# Patient Record
Sex: Female | Born: 1965 | Race: Black or African American | Hispanic: No | Marital: Married | Smoking: Never smoker
Health system: Southern US, Community
[De-identification: ages and names within clinical notes are randomized; demographics above are authoritative.]

## PROBLEM LIST (undated history)

## (undated) DIAGNOSIS — E119 Type 2 diabetes mellitus without complications: Secondary | ICD-10-CM

## (undated) DIAGNOSIS — I10 Essential (primary) hypertension: Secondary | ICD-10-CM

## (undated) DIAGNOSIS — E785 Hyperlipidemia, unspecified: Secondary | ICD-10-CM

## (undated) DIAGNOSIS — O88219 Thromboembolism in pregnancy, unspecified trimester: Secondary | ICD-10-CM

## (undated) HISTORY — PX: OTHER SURGICAL HISTORY: SHX169

## (undated) HISTORY — DX: Type 2 diabetes mellitus without complications: E11.9

## (undated) HISTORY — DX: Hyperlipidemia, unspecified: E78.5

## (undated) HISTORY — PX: NO PAST SURGERIES: SHX2092

---

## 2000-07-31 DIAGNOSIS — O88219 Thromboembolism in pregnancy, unspecified trimester: Secondary | ICD-10-CM

## 2000-07-31 HISTORY — DX: Thromboembolism in pregnancy, unspecified trimester: O88.219

## 2004-03-10 ENCOUNTER — Emergency Department (HOSPITAL_COMMUNITY): Admission: EM | Admit: 2004-03-10 | Discharge: 2004-03-10 | Payer: Self-pay | Admitting: Emergency Medicine

## 2004-03-26 ENCOUNTER — Emergency Department (HOSPITAL_COMMUNITY): Admission: EM | Admit: 2004-03-26 | Discharge: 2004-03-26 | Payer: Self-pay | Admitting: Emergency Medicine

## 2007-11-12 ENCOUNTER — Encounter: Admission: RE | Admit: 2007-11-12 | Discharge: 2007-11-12 | Payer: Self-pay | Admitting: Family Medicine

## 2007-11-22 ENCOUNTER — Encounter: Admission: RE | Admit: 2007-11-22 | Discharge: 2007-11-22 | Payer: Self-pay | Admitting: Family Medicine

## 2010-08-21 ENCOUNTER — Encounter: Payer: Self-pay | Admitting: Family Medicine

## 2010-11-25 ENCOUNTER — Other Ambulatory Visit: Payer: Self-pay | Admitting: Obstetrics

## 2010-11-25 DIAGNOSIS — Z1231 Encounter for screening mammogram for malignant neoplasm of breast: Secondary | ICD-10-CM

## 2010-12-02 ENCOUNTER — Ambulatory Visit (HOSPITAL_COMMUNITY): Admission: RE | Admit: 2010-12-02 | Payer: BC Managed Care – PPO | Source: Ambulatory Visit

## 2011-01-31 ENCOUNTER — Ambulatory Visit (HOSPITAL_COMMUNITY)
Admission: RE | Admit: 2011-01-31 | Discharge: 2011-01-31 | Disposition: A | Discharge status: Home / Self Care | Payer: BC Managed Care – PPO | Source: Ambulatory Visit | Attending: Obstetrics | Admitting: Obstetrics

## 2011-01-31 DIAGNOSIS — Z1231 Encounter for screening mammogram for malignant neoplasm of breast: Secondary | ICD-10-CM | POA: Insufficient documentation

## 2011-02-03 ENCOUNTER — Other Ambulatory Visit: Payer: Self-pay | Admitting: Obstetrics

## 2011-02-03 DIAGNOSIS — R928 Other abnormal and inconclusive findings on diagnostic imaging of breast: Secondary | ICD-10-CM

## 2011-02-08 ENCOUNTER — Ambulatory Visit
Admission: RE | Admit: 2011-02-08 | Discharge: 2011-02-08 | Disposition: A | Discharge status: Home / Self Care | Payer: BC Managed Care – PPO | Source: Ambulatory Visit | Attending: Obstetrics | Admitting: Obstetrics

## 2011-02-08 DIAGNOSIS — R928 Other abnormal and inconclusive findings on diagnostic imaging of breast: Secondary | ICD-10-CM

## 2011-03-03 ENCOUNTER — Ambulatory Visit: Admit: 2011-03-03 | Payer: Self-pay | Admitting: Obstetrics & Gynecology

## 2011-03-03 SURGERY — DILATATION & CURETTAGE/HYSTEROSCOPY WITH HYDROTHERMAL ABLATION
Anesthesia: Choice

## 2011-03-16 ENCOUNTER — Other Ambulatory Visit: Payer: Self-pay | Admitting: Obstetrics & Gynecology

## 2011-03-22 ENCOUNTER — Ambulatory Visit: Payer: BC Managed Care – PPO | Admitting: Physical Therapy

## 2011-03-28 ENCOUNTER — Ambulatory Visit: Payer: BC Managed Care – PPO | Attending: Orthopedic Surgery | Admitting: Physical Therapy

## 2011-03-28 DIAGNOSIS — IMO0001 Reserved for inherently not codable concepts without codable children: Secondary | ICD-10-CM | POA: Insufficient documentation

## 2011-03-28 DIAGNOSIS — R262 Difficulty in walking, not elsewhere classified: Secondary | ICD-10-CM | POA: Insufficient documentation

## 2011-03-28 DIAGNOSIS — M25676 Stiffness of unspecified foot, not elsewhere classified: Secondary | ICD-10-CM | POA: Insufficient documentation

## 2011-03-28 DIAGNOSIS — M25673 Stiffness of unspecified ankle, not elsewhere classified: Secondary | ICD-10-CM | POA: Insufficient documentation

## 2011-03-28 DIAGNOSIS — M25579 Pain in unspecified ankle and joints of unspecified foot: Secondary | ICD-10-CM | POA: Insufficient documentation

## 2011-03-30 ENCOUNTER — Ambulatory Visit: Payer: BC Managed Care – PPO | Admitting: Physical Therapy

## 2011-04-05 ENCOUNTER — Ambulatory Visit: Payer: BC Managed Care – PPO | Attending: Orthopedic Surgery | Admitting: Physical Therapy

## 2011-04-05 DIAGNOSIS — R262 Difficulty in walking, not elsewhere classified: Secondary | ICD-10-CM | POA: Insufficient documentation

## 2011-04-05 DIAGNOSIS — M25676 Stiffness of unspecified foot, not elsewhere classified: Secondary | ICD-10-CM | POA: Insufficient documentation

## 2011-04-05 DIAGNOSIS — IMO0001 Reserved for inherently not codable concepts without codable children: Secondary | ICD-10-CM | POA: Insufficient documentation

## 2011-04-05 DIAGNOSIS — M25579 Pain in unspecified ankle and joints of unspecified foot: Secondary | ICD-10-CM | POA: Insufficient documentation

## 2011-04-05 DIAGNOSIS — M25673 Stiffness of unspecified ankle, not elsewhere classified: Secondary | ICD-10-CM | POA: Insufficient documentation

## 2011-04-10 ENCOUNTER — Ambulatory Visit: Payer: BC Managed Care – PPO | Admitting: Physical Therapy

## 2011-04-11 ENCOUNTER — Encounter (HOSPITAL_COMMUNITY): Payer: Self-pay | Admitting: *Deleted

## 2011-04-12 ENCOUNTER — Ambulatory Visit: Payer: BC Managed Care – PPO | Admitting: Physical Therapy

## 2011-04-17 ENCOUNTER — Ambulatory Visit: Payer: BC Managed Care – PPO | Admitting: Physical Therapy

## 2011-04-19 ENCOUNTER — Ambulatory Visit: Payer: BC Managed Care – PPO | Admitting: Physical Therapy

## 2011-04-21 ENCOUNTER — Encounter (HOSPITAL_COMMUNITY): Payer: Self-pay | Admitting: Obstetrics & Gynecology

## 2011-04-21 ENCOUNTER — Encounter (HOSPITAL_COMMUNITY): Payer: Self-pay | Admitting: Anesthesiology

## 2011-04-21 ENCOUNTER — Ambulatory Visit (HOSPITAL_COMMUNITY): Payer: BC Managed Care – PPO | Admitting: Anesthesiology

## 2011-04-21 ENCOUNTER — Encounter (HOSPITAL_COMMUNITY): Payer: Self-pay | Admitting: *Deleted

## 2011-04-21 ENCOUNTER — Other Ambulatory Visit: Payer: Self-pay | Admitting: Obstetrics & Gynecology

## 2011-04-21 ENCOUNTER — Encounter (HOSPITAL_COMMUNITY)
Admission: RE | Disposition: A | Discharge status: Home / Self Care | Payer: Self-pay | Source: Ambulatory Visit | Attending: Obstetrics & Gynecology

## 2011-04-21 ENCOUNTER — Ambulatory Visit (HOSPITAL_COMMUNITY)
Admission: RE | Admit: 2011-04-21 | Discharge: 2011-04-21 | Disposition: A | Discharge status: Home / Self Care | Payer: BC Managed Care – PPO | Source: Ambulatory Visit | Attending: Obstetrics & Gynecology | Admitting: Obstetrics & Gynecology

## 2011-04-21 DIAGNOSIS — Z302 Encounter for sterilization: Secondary | ICD-10-CM | POA: Insufficient documentation

## 2011-04-21 DIAGNOSIS — Z309 Encounter for contraceptive management, unspecified: Secondary | ICD-10-CM

## 2011-04-21 DIAGNOSIS — Z641 Problems related to multiparity: Secondary | ICD-10-CM | POA: Insufficient documentation

## 2011-04-21 HISTORY — PX: TUBAL LIGATION: SHX77

## 2011-04-21 HISTORY — DX: Thromboembolism in pregnancy, unspecified trimester: O88.219

## 2011-04-21 LAB — CBC
HCT: 34.7 % — ABNORMAL LOW (ref 36.0–46.0)
Hemoglobin: 11.1 g/dL — ABNORMAL LOW (ref 12.0–15.0)
MCH: 28.3 pg (ref 26.0–34.0)
MCHC: 32 g/dL (ref 30.0–36.0)
MCV: 88.5 fL (ref 78.0–100.0)
Platelets: 273 10*3/uL (ref 150–400)
RBC: 3.92 MIL/uL (ref 3.87–5.11)
RDW: 14.3 % (ref 11.5–15.5)
WBC: 6.4 10*3/uL (ref 4.0–10.5)

## 2011-04-21 SURGERY — ESSURE TUBAL STERILIZATION
Anesthesia: Choice | Site: Vagina | Laterality: Bilateral | Wound class: Clean Contaminated

## 2011-04-21 MED ORDER — LIDOCAINE HCL 1 % IJ SOLN
INTRAMUSCULAR | Status: DC | PRN
  Administered 2011-04-21: 10 mL

## 2011-04-21 MED ORDER — KETOROLAC TROMETHAMINE 30 MG/ML IJ SOLN
INTRAMUSCULAR | Status: DC | PRN
  Administered 2011-04-21: 30 mg via INTRAVENOUS

## 2011-04-21 MED ORDER — SODIUM CHLORIDE 0.9 % IR SOLN
Status: DC | PRN
  Administered 2011-04-21: 3000 mL

## 2011-04-21 MED ORDER — LIDOCAINE HCL (CARDIAC) 20 MG/ML IV SOLN
INTRAVENOUS | Status: DC | PRN
  Administered 2011-04-21: 100 mg via INTRAVENOUS

## 2011-04-21 MED ORDER — PROMETHAZINE HCL 25 MG/ML IJ SOLN: 6.2500 mg | INTRAMUSCULAR | Status: DC | PRN

## 2011-04-21 MED ORDER — MIDAZOLAM HCL 5 MG/5ML IJ SOLN
INTRAMUSCULAR | Status: DC | PRN
  Administered 2011-04-21: 2 mg via INTRAVENOUS

## 2011-04-21 MED ORDER — ONDANSETRON HCL 4 MG/2ML IJ SOLN
INTRAMUSCULAR | Status: DC | PRN
  Administered 2011-04-21: 4 mg via INTRAVENOUS

## 2011-04-21 MED ORDER — KETOROLAC TROMETHAMINE 30 MG/ML IJ SOLN: 15.0000 mg | Freq: Once | INTRAMUSCULAR | Status: DC | PRN

## 2011-04-21 MED ORDER — ACETAMINOPHEN 325 MG PO TABS: 325.0000 mg | ORAL_TABLET | ORAL | Status: DC | PRN

## 2011-04-21 MED ORDER — PROPOFOL 10 MG/ML IV EMUL
INTRAVENOUS | Status: AC
  Filled 2011-04-21: qty 20

## 2011-04-21 MED ORDER — KETOROLAC TROMETHAMINE 30 MG/ML IJ SOLN
INTRAMUSCULAR | Status: AC
  Filled 2011-04-21: qty 1

## 2011-04-21 MED ORDER — DEXAMETHASONE SODIUM PHOSPHATE 4 MG/ML IJ SOLN
INTRAMUSCULAR | Status: DC | PRN
  Administered 2011-04-21: 10 mg via INTRAVENOUS

## 2011-04-21 MED ORDER — LIDOCAINE HCL (CARDIAC) 20 MG/ML IV SOLN
INTRAVENOUS | Status: AC
  Filled 2011-04-21: qty 5

## 2011-04-21 MED ORDER — OXYCODONE-ACETAMINOPHEN 5-325 MG PO TABS: 2.0000 | ORAL_TABLET | Freq: Four times a day (QID) | ORAL | Status: AC | PRN

## 2011-04-21 MED ORDER — FENTANYL CITRATE 0.05 MG/ML IJ SOLN
INTRAMUSCULAR | Status: AC
  Filled 2011-04-21: qty 2

## 2011-04-21 MED ORDER — FENTANYL CITRATE 0.05 MG/ML IJ SOLN: 25.0000 ug | INTRAMUSCULAR | Status: DC | PRN

## 2011-04-21 MED ORDER — ONDANSETRON HCL 4 MG/2ML IJ SOLN
INTRAMUSCULAR | Status: AC
  Filled 2011-04-21: qty 2

## 2011-04-21 MED ORDER — PROPOFOL 10 MG/ML IV EMUL
INTRAVENOUS | Status: DC | PRN
  Administered 2011-04-21: 200 mg via INTRAVENOUS

## 2011-04-21 MED ORDER — DEXAMETHASONE SODIUM PHOSPHATE 10 MG/ML IJ SOLN
INTRAMUSCULAR | Status: AC
  Filled 2011-04-21: qty 1

## 2011-04-21 MED ORDER — MIDAZOLAM HCL 2 MG/2ML IJ SOLN
INTRAMUSCULAR | Status: AC
  Filled 2011-04-21: qty 2

## 2011-04-21 MED ORDER — LACTATED RINGERS IV SOLN
INTRAVENOUS | Status: DC
  Administered 2011-04-21 (×2): via INTRAVENOUS

## 2011-04-21 MED ORDER — FENTANYL CITRATE 0.05 MG/ML IJ SOLN
INTRAMUSCULAR | Status: DC | PRN
  Administered 2011-04-21: 100 ug via INTRAVENOUS

## 2011-04-21 SURGICAL SUPPLY — 10 items
CATH ROBINSON RED A/P 16FR (CATHETERS) ×2 IMPLANT
CLOTH BEACON ORANGE TIMEOUT ST (SAFETY) ×2 IMPLANT
CONTAINER PREFILL 10% NBF 60ML (FORM) IMPLANT
DRAPE UTILITY XL STRL (DRAPES) ×2 IMPLANT
GLOVE BIO SURGEON STRL SZ 6.5 (GLOVE) ×4 IMPLANT
GOWN PREVENTION PLUS LG XLONG (DISPOSABLE) ×4 IMPLANT
KIT ESSURE FALLOPIAN CLOSURE (Ring) ×2 IMPLANT
PACK HYSTEROSCOPY LF (CUSTOM PROCEDURE TRAY) ×2 IMPLANT
TOWEL OR 17X24 6PK STRL BLUE (TOWEL DISPOSABLE) ×4 IMPLANT
WATER STERILE IRR 1000ML POUR (IV SOLUTION) ×2 IMPLANT

## 2011-04-21 NOTE — H&P (Signed)
  Chief Complaint: 45 y.o.  who presents for an Essure procedure  Details of Present Illness: See above  BP 114/52  Pulse 68  Temp(Src) 98.1 F (36.7 C) (Oral)  Resp 16  Ht 5\' 8"  (1.727 m)  Wt 88.451 kg (195 lb)  BMI 29.65 kg/m2  SpO2 99%  LMP 03/14/2011  Past Medical History  Diagnosis Date  . Pulmonary embolism, blood-clot, obstetric 2002    on birth control-pulm blood clot-treated with coumadin-no problems now   History   Social History  . Marital Status: Married    Spouse Name: N/A    Number of Children: N/A  . Years of Education: N/A   Occupational History  . Not on file.   Social History Main Topics  . Smoking status: Never Smoker   . Smokeless tobacco: Not on file  . Alcohol Use: No  . Drug Use: No  . Sexually Active: Yes   Other Topics Concern  . Not on file   Social History Narrative  . No narrative on file   History reviewed. No pertinent family history.  Pertinent items are noted in HPI.  Pre-Op Diagnosis: Desires sterilization  Planned Procedure: Procedure(s): ESSURE TUBAL STERILIZATION  I have reviewed the patient's history and have completed the physical exam and Tanisa Lagace is acceptable for surgery.  Roseanna Rainbow, MD 04/21/2011 1:28 PM

## 2011-04-21 NOTE — Anesthesia Postprocedure Evaluation (Signed)
Anesthesia Post Note  Patient: Bianca Simmons  Procedure(s) Performed:  ESSURE TUBAL STERILIZATION -  attempted essure,removal of IUD, hysteroscopy,         dilatation and currettage  Anesthesia type: GA  Patient location: PACU  Post pain: Pain level controlled  Post assessment: Post-op Vital signs reviewed  Last Vitals:  Filed Vitals:   04/21/11 1430  BP: 101/61  Pulse: 64  Temp:   Resp: 12    Post vital signs: Reviewed  Level of consciousness: sedated  Complications: No apparent anesthesia complications

## 2011-04-21 NOTE — Op Note (Addendum)
Preoperative diagnosis: Multiparity, desires sterilization  Postoperative diagnosis: Same  Procedure: Hysteroscopy, D&C Surgeon: Antionette Char A  Anesthesia:LMA, paracervical block  Estimated blood loss: Minimal  Urine output: per Anesthesiology  IV Fluids:  per Anesthesiology  Complications: None  Specimen: PATHOLOGY  Operative Findings: The uterine cavity was distorted.  The lining was lush in the cornua bilaterally.  The left tubal ostia was seen.  The right tubal ostia was not visualized.  Description of procedure:   The patient was taken to the operating room and placed on the operating table in the semi-lithotomy position in [place] stirrups.  Examination under anesthesia was performed.  The patient was prepped and draped in the usual manner.  After a time-out had been completed, a speculum was placed in the vagina.  The anterior lip of the cervix was grasped with a single-toothed tenaculum.  A paracervical block was performed using 10 ml of 1% lidocaine.  The block was performed at 4 and 8 o'clock at the cervical vaginal junction.  A 5 mm 30 hysteroscope was then inserted under direct visualization using glycine as a distending medium. The uterine cavity was viewed and the above findings were noted. The decision was made not to proceed with the Essure procedure.  The cervix was dilated with Shawnie Pons dilators.  The uterus sounded to 7 cm.  A sharp curettage was performed.  All the instruments were removed from the vagina.  Final instrument counts were correct.  The patient was taken to the PACU in stable condition.    A copper IUD was removed intact at the start of the procedure.

## 2011-04-21 NOTE — Transfer of Care (Signed)
Immediate Anesthesia Transfer of Care Note  Patient: Bianca Simmons  Procedure(s) Performed:  ESSURE TUBAL STERILIZATION -  attempted essure,removal of IUD, hysteroscopy,         dilatation and currettage  Patient Location: PACU  Anesthesia Type: General  Level of Consciousness: awake, alert  and oriented  Airway & Oxygen Therapy: Patient Spontanous Breathing and Patient connected to nasal cannula oxygen  Post-op Assessment: Report given to PACU RN and Post -op Vital signs reviewed and stable  Post vital signs: Reviewed and stable  Complications: No apparent anesthesia complications

## 2011-04-21 NOTE — Anesthesia Preprocedure Evaluation (Signed)
Anesthesia Evaluation  Name, MR# and DOB Patient awake  General Assessment Comment  Reviewed: Allergy & Precautions, H&P , NPO status , Patient's Chart, lab work & pertinent test results, reviewed documented beta blocker date and time   Airway Mallampati: II TM Distance: >3 FB Neck ROM: full    Dental  (+) Teeth Intact and Partial Upper   Pulmonary    pulmonary exam normalPulmonary Exam Normal     Cardiovascular     Neuro/Psych Negative Neurological ROS  Negative Psych ROS  GI/Hepatic/Renal negative GI ROS  negative Liver ROS  negative Renal ROS        Endo/Other  Negative Endocrine ROS (+)      Abdominal Normal abdominal exam  (+)   Musculoskeletal negative musculoskeletal ROS (+)   Hematology negative hematology ROS (+)   Peds  Reproductive/Obstetrics negative OB ROS    Anesthesia Other Findings             Anesthesia Physical Anesthesia Plan  ASA: I  Anesthesia Plan: General   Post-op Pain Management:    Induction: Intravenous  Airway Management Planned: LMA  Additional Equipment:   Intra-op Plan:   Post-operative Plan:   Informed Consent: I have reviewed the patients History and Physical, chart, labs and discussed the procedure including the risks, benefits and alternatives for the proposed anesthesia with the patient or authorized representative who has indicated his/her understanding and acceptance.     Plan Discussed with: CRNA  Anesthesia Plan Comments:         Anesthesia Quick Evaluation

## 2011-04-25 ENCOUNTER — Ambulatory Visit: Payer: BC Managed Care – PPO | Admitting: Physical Therapy

## 2011-04-27 ENCOUNTER — Encounter: Payer: BC Managed Care – PPO | Admitting: Physical Therapy

## 2011-04-27 ENCOUNTER — Encounter (HOSPITAL_COMMUNITY): Payer: Self-pay | Admitting: Obstetrics & Gynecology

## 2011-05-01 ENCOUNTER — Ambulatory Visit: Payer: BC Managed Care – PPO | Attending: Orthopedic Surgery | Admitting: Physical Therapy

## 2011-05-01 DIAGNOSIS — R262 Difficulty in walking, not elsewhere classified: Secondary | ICD-10-CM | POA: Insufficient documentation

## 2011-05-01 DIAGNOSIS — M25579 Pain in unspecified ankle and joints of unspecified foot: Secondary | ICD-10-CM | POA: Insufficient documentation

## 2011-05-01 DIAGNOSIS — M25673 Stiffness of unspecified ankle, not elsewhere classified: Secondary | ICD-10-CM | POA: Insufficient documentation

## 2011-05-01 DIAGNOSIS — IMO0001 Reserved for inherently not codable concepts without codable children: Secondary | ICD-10-CM | POA: Insufficient documentation

## 2011-05-01 DIAGNOSIS — M25676 Stiffness of unspecified foot, not elsewhere classified: Secondary | ICD-10-CM | POA: Insufficient documentation

## 2011-05-01 HISTORY — PX: IUD REMOVAL: SHX5392

## 2011-05-03 ENCOUNTER — Ambulatory Visit: Payer: BC Managed Care – PPO | Admitting: Physical Therapy

## 2011-05-08 ENCOUNTER — Ambulatory Visit: Payer: BC Managed Care – PPO | Admitting: Physical Therapy

## 2011-05-10 ENCOUNTER — Ambulatory Visit: Payer: BC Managed Care – PPO | Admitting: Physical Therapy

## 2011-05-12 ENCOUNTER — Other Ambulatory Visit: Payer: Self-pay | Admitting: Obstetrics & Gynecology

## 2011-06-29 ENCOUNTER — Encounter (HOSPITAL_COMMUNITY): Payer: Self-pay | Admitting: Pharmacist

## 2011-06-29 NOTE — Patient Instructions (Addendum)
   Your procedure is scheduled on: Friday, Dec 14th  Enter through the [employer] of [facility] at:  11:30am Pick up the phone at the desk and dial [phone] and inform us of your arrival.  Please call this number if you have any problems the morning of surgery: [phone]  Remember: Do not eat food after midnight: Thursday Do not drink clear liquids after: Take these medicines the morning of surgery with a SIP OF WATER:  Do not wear jewelry, make-up, or FINGER nail polish Do not wear lotions, powders, perfumes or deodorant. Do not shave 48 hours prior to surgery. Do not bring valuables to the hospital.  Leave suitcase in the car. After Surgery it may be brought to your room. For patients being admitted to the hospital, checkout time is 11:00am the day of discharge.  Patients discharged on the day of surgery will not be allowed to drive home.    Remember to use your hibiclens as instructed.Please shower with 1/2 bottle the evening before your surgery and the other 1/2 bottle the morning of surgery.

## 2011-07-04 ENCOUNTER — Encounter (HOSPITAL_COMMUNITY)
Admission: RE | Admit: 2011-07-04 | Discharge: 2011-07-04 | Disposition: A | Discharge status: Home / Self Care | Payer: BC Managed Care – PPO | Source: Ambulatory Visit | Attending: Obstetrics & Gynecology | Admitting: Obstetrics & Gynecology

## 2011-07-04 ENCOUNTER — Encounter (HOSPITAL_COMMUNITY): Payer: Self-pay

## 2011-07-04 LAB — CBC
HCT: 34.3 % — ABNORMAL LOW (ref 36.0–46.0)
Hemoglobin: 10.8 g/dL — ABNORMAL LOW (ref 12.0–15.0)
MCH: 28.1 pg (ref 26.0–34.0)
MCHC: 31.5 g/dL (ref 30.0–36.0)
MCV: 89.3 fL (ref 78.0–100.0)
Platelets: 249 10*3/uL (ref 150–400)
RBC: 3.84 MIL/uL — ABNORMAL LOW (ref 3.87–5.11)
RDW: 14.6 % (ref 11.5–15.5)
WBC: 5.6 10*3/uL (ref 4.0–10.5)

## 2011-07-04 LAB — SURGICAL PCR SCREEN
MRSA, PCR: NEGATIVE
Staphylococcus aureus: NEGATIVE

## 2011-07-04 NOTE — Pre-Procedure Instructions (Signed)
Call to MD office to confirm procedure and time. Consent to read; Laparscopic tubal sterilization and Novasure ablation. OR time should be 2:30. Per Jasmine/MD office, states she will correct with OR.

## 2011-07-14 ENCOUNTER — Encounter (HOSPITAL_COMMUNITY): Payer: Self-pay | Admitting: Anesthesiology

## 2011-07-14 ENCOUNTER — Encounter (HOSPITAL_COMMUNITY): Payer: Self-pay | Admitting: *Deleted

## 2011-07-14 ENCOUNTER — Encounter (HOSPITAL_COMMUNITY): Payer: Self-pay | Admitting: Obstetrics & Gynecology

## 2011-07-14 ENCOUNTER — Ambulatory Visit (HOSPITAL_COMMUNITY): Payer: BC Managed Care – PPO | Admitting: Anesthesiology

## 2011-07-14 ENCOUNTER — Ambulatory Visit (HOSPITAL_COMMUNITY)
Admission: RE | Admit: 2011-07-14 | Discharge: 2011-07-14 | Disposition: A | Discharge status: Home / Self Care | Payer: BC Managed Care – PPO | Source: Ambulatory Visit | Attending: Obstetrics & Gynecology | Admitting: Obstetrics & Gynecology

## 2011-07-14 ENCOUNTER — Encounter (HOSPITAL_COMMUNITY)
Admission: RE | Disposition: A | Discharge status: Home / Self Care | Payer: Self-pay | Source: Ambulatory Visit | Attending: Obstetrics & Gynecology

## 2011-07-14 ENCOUNTER — Other Ambulatory Visit: Payer: Self-pay | Admitting: Obstetrics & Gynecology

## 2011-07-14 DIAGNOSIS — Z641 Problems related to multiparity: Secondary | ICD-10-CM | POA: Insufficient documentation

## 2011-07-14 DIAGNOSIS — N939 Abnormal uterine and vaginal bleeding, unspecified: Secondary | ICD-10-CM | POA: Diagnosis present

## 2011-07-14 DIAGNOSIS — Z01818 Encounter for other preprocedural examination: Secondary | ICD-10-CM | POA: Insufficient documentation

## 2011-07-14 DIAGNOSIS — N920 Excessive and frequent menstruation with regular cycle: Secondary | ICD-10-CM | POA: Insufficient documentation

## 2011-07-14 DIAGNOSIS — Z01812 Encounter for preprocedural laboratory examination: Secondary | ICD-10-CM | POA: Insufficient documentation

## 2011-07-14 DIAGNOSIS — Z302 Encounter for sterilization: Secondary | ICD-10-CM | POA: Insufficient documentation

## 2011-07-14 HISTORY — PX: LAPAROSCOPIC TUBAL LIGATION: SHX1937

## 2011-07-14 LAB — PREGNANCY, URINE: Preg Test, Ur: NEGATIVE

## 2011-07-14 SURGERY — LIGATION, FALLOPIAN TUBE, LAPAROSCOPIC
Anesthesia: General | Site: Uterus | Wound class: Clean Contaminated

## 2011-07-14 MED ORDER — MIDAZOLAM HCL 5 MG/5ML IJ SOLN
INTRAMUSCULAR | Status: DC | PRN
  Administered 2011-07-14: 2 mg via INTRAVENOUS

## 2011-07-14 MED ORDER — BUPIVACAINE HCL (PF) 0.25 % IJ SOLN
INTRAMUSCULAR | Status: DC | PRN
  Administered 2011-07-14: 4 mL

## 2011-07-14 MED ORDER — ONDANSETRON HCL 4 MG/2ML IJ SOLN
INTRAMUSCULAR | Status: DC | PRN
  Administered 2011-07-14: 4 mg via INTRAVENOUS

## 2011-07-14 MED ORDER — OXYCODONE-ACETAMINOPHEN 5-325 MG PO TABS
ORAL_TABLET | ORAL | Status: AC
  Administered 2011-07-14: 1 via ORAL
  Filled 2011-07-14: qty 1

## 2011-07-14 MED ORDER — PROPOFOL 10 MG/ML IV EMUL
INTRAVENOUS | Status: AC
  Filled 2011-07-14: qty 20

## 2011-07-14 MED ORDER — GLYCOPYRROLATE 0.2 MG/ML IJ SOLN
INTRAMUSCULAR | Status: DC | PRN
  Administered 2011-07-14: .4 mg via INTRAVENOUS

## 2011-07-14 MED ORDER — LIDOCAINE HCL (CARDIAC) 20 MG/ML IV SOLN
INTRAVENOUS | Status: DC | PRN
  Administered 2011-07-14: 50 mg via INTRAVENOUS

## 2011-07-14 MED ORDER — OXYCODONE-ACETAMINOPHEN 5-325 MG PO TABS: 2.0000 | ORAL_TABLET | Freq: Four times a day (QID) | ORAL | Status: AC | PRN

## 2011-07-14 MED ORDER — NEOSTIGMINE METHYLSULFATE 1 MG/ML IJ SOLN
INTRAMUSCULAR | Status: DC | PRN
  Administered 2011-07-14: 2 mg via INTRAVENOUS

## 2011-07-14 MED ORDER — FENTANYL CITRATE 0.05 MG/ML IJ SOLN
INTRAMUSCULAR | Status: DC | PRN
  Administered 2011-07-14 (×2): 50 ug via INTRAVENOUS
  Administered 2011-07-14: 150 ug via INTRAVENOUS

## 2011-07-14 MED ORDER — LACTATED RINGERS IV SOLN
INTRAVENOUS | Status: DC | PRN
  Administered 2011-07-14: 3000 mL via INTRAUTERINE

## 2011-07-14 MED ORDER — FENTANYL CITRATE 0.05 MG/ML IJ SOLN: 25.0000 ug | INTRAMUSCULAR | Status: DC | PRN

## 2011-07-14 MED ORDER — ONDANSETRON HCL 4 MG/2ML IJ SOLN
INTRAMUSCULAR | Status: AC
  Filled 2011-07-14: qty 2

## 2011-07-14 MED ORDER — LACTATED RINGERS IV SOLN
INTRAVENOUS | Status: DC
  Administered 2011-07-14 (×2): via INTRAVENOUS

## 2011-07-14 MED ORDER — PROPOFOL 10 MG/ML IV EMUL
INTRAVENOUS | Status: DC | PRN
  Administered 2011-07-14: 180 mg via INTRAVENOUS

## 2011-07-14 MED ORDER — OXYCODONE-ACETAMINOPHEN 5-325 MG PO TABS
ORAL_TABLET | ORAL | Status: AC
  Filled 2011-07-14: qty 1

## 2011-07-14 MED ORDER — FENTANYL CITRATE 0.05 MG/ML IJ SOLN
INTRAMUSCULAR | Status: AC
  Filled 2011-07-14: qty 5

## 2011-07-14 MED ORDER — DEXAMETHASONE SODIUM PHOSPHATE 10 MG/ML IJ SOLN
INTRAMUSCULAR | Status: DC | PRN
  Administered 2011-07-14: 10 mg via INTRAVENOUS

## 2011-07-14 MED ORDER — LIDOCAINE HCL (CARDIAC) 20 MG/ML IV SOLN
INTRAVENOUS | Status: AC
  Filled 2011-07-14: qty 5

## 2011-07-14 MED ORDER — KETOROLAC TROMETHAMINE 30 MG/ML IJ SOLN
INTRAMUSCULAR | Status: DC | PRN
  Administered 2011-07-14: 30 mg via INTRAVENOUS

## 2011-07-14 MED ORDER — LIDOCAINE HCL 1 % IJ SOLN
INTRAMUSCULAR | Status: DC | PRN
  Administered 2011-07-14: 10 mL

## 2011-07-14 MED ORDER — MIDAZOLAM HCL 2 MG/2ML IJ SOLN
INTRAMUSCULAR | Status: AC
  Filled 2011-07-14: qty 2

## 2011-07-14 MED ORDER — ROCURONIUM BROMIDE 50 MG/5ML IV SOLN
INTRAVENOUS | Status: AC
  Filled 2011-07-14: qty 1

## 2011-07-14 MED ORDER — KETOROLAC TROMETHAMINE 30 MG/ML IJ SOLN
INTRAMUSCULAR | Status: AC
  Filled 2011-07-14: qty 1

## 2011-07-14 MED ORDER — OXYCODONE-ACETAMINOPHEN 5-325 MG PO TABS
1.0000 | ORAL_TABLET | Freq: Once | ORAL | Status: AC
  Administered 2011-07-14: 1 via ORAL

## 2011-07-14 MED ORDER — ROCURONIUM BROMIDE 100 MG/10ML IV SOLN
INTRAVENOUS | Status: DC | PRN
  Administered 2011-07-14: 40 mg via INTRAVENOUS

## 2011-07-14 SURGICAL SUPPLY — 20 items
ABLATOR ENDOMETRIAL BIPOLAR (ABLATOR) ×3 IMPLANT
CANISTER SUCTION 2500CC (MISCELLANEOUS) ×3 IMPLANT
CATH ROBINSON RED A/P 16FR (CATHETERS) ×3 IMPLANT
CHLORAPREP W/TINT 26ML (MISCELLANEOUS) ×3 IMPLANT
CLOTH BEACON ORANGE TIMEOUT ST (SAFETY) ×3 IMPLANT
CONTAINER PREFILL 10% NBF 60ML (FORM) ×3 IMPLANT
DERMABOND ADVANCED (GAUZE/BANDAGES/DRESSINGS) ×1
DERMABOND ADVANCED .7 DNX12 (GAUZE/BANDAGES/DRESSINGS) ×2 IMPLANT
GLOVE BIO SURGEON STRL SZ 6.5 (GLOVE) ×9 IMPLANT
GOWN PREVENTION PLUS LG XLONG (DISPOSABLE) ×6 IMPLANT
GOWN STRL REIN XL XLG (GOWN DISPOSABLE) ×3 IMPLANT
NEEDLE SPNL 20GX3.5 QUINCKE YW (NEEDLE) ×3 IMPLANT
PACK HYSTEROSCOPY LF (CUSTOM PROCEDURE TRAY) ×3 IMPLANT
PACK LAPAROSCOPY BASIN (CUSTOM PROCEDURE TRAY) ×3 IMPLANT
SUT VIC AB 3-0 PS2 18 (SUTURE)
SUT VIC AB 3-0 PS2 18XBRD (SUTURE) IMPLANT
SUT VICRYL 0 UR6 27IN ABS (SUTURE) ×3 IMPLANT
TOWEL OR 17X24 6PK STRL BLUE (TOWEL DISPOSABLE) ×6 IMPLANT
TROCAR XCEL NON-BLD 11X100MML (ENDOMECHANICALS) ×3 IMPLANT
WATER STERILE IRR 1000ML POUR (IV SOLUTION) ×3 IMPLANT

## 2011-07-14 NOTE — Anesthesia Procedure Notes (Addendum)
Procedure Name: Intubation Date/Time: 07/14/2011 2:34 PM Performed by: Madison Hickman Pre-anesthesia Checklist: Patient identified, Emergency Drugs available, Suction available, Timeout performed and Patient being monitored Patient Re-evaluated:Patient Re-evaluated prior to inductionOxygen Delivery Method: Circle System Utilized Preoxygenation: Pre-oxygenation with 100% oxygen Intubation Type: IV induction Ventilation: Mask ventilation without difficulty Laryngoscope Size: Mac and 3 Grade View: Grade III Tube type: Oral Tube size: 7.0 mm Number of attempts: 2 Airway Equipment and Method: stylet and video-laryngoscopy Placement Confirmation: ETT inserted through vocal cords under direct vision,  breath sounds checked- equal and bilateral and positive ETCO2 Secured at: 20 cm Dental Injury: Teeth and Oropharynx as per pre-operative assessment  Difficulty Due To: Difficulty was unanticipated and Difficult Airway- due to limited oral opening

## 2011-07-14 NOTE — Op Note (Signed)
Procedure Note  Bianca Simmons 45 y.o. 07/14/2011  Preoperative Diagnosis:  Multiparity, desires a sterilization procedure, abnormal uterine bleeding  Postoperative Diagnosis: Same  Procedure: Laparoscopic bilateral tubal ligation with fulguration, D&C, hysteroscopy, Novasure ablation  Surgeon: Antionette Char A  Indications:  The patient now presents for a sterilization/Novasurea ablation procedure after discussing therapeutic alternatives.        Procedure Detail:  The patient was taken to the operating room and was placed on the operating table in the dorsal supine position.  After his satisfactory general anesthesia was achieved, the patient was placed in the semi-lithotomy position using Allen stirrups. The patient was prepped and draped in the usual sterile manner for vaginal laparoscopic procedure. A speculum was placed in the vagina. The anterior lip of the cervix was grasped with a single-tooth tenaculum. A Kahn cannula was then advanced into the uterus and secured  to the anterior lip of the cervix as a means to manipulate the uterus.  The infraumbilical region was then anesthetized with local anesthesia, quarter percent Artane. A small incision was made to the skin and subcutaneous tissue. A 10 mm Optiview trocar was placed through the incision into the abdominal cavity diagnostic laparoscope with video camera attached was placed through the trocar sleeve and carbon dioxide was used to insufflate the abdominal and pelvic cavity. The pelvic contents were examined and the findings were described above. Kleppinger bipolar forceps were inserted through the operative port of the laparoscope. The left fallopian tube was identified and traced out to its fimbriated end. Then starting at the distal isthmus the proximal ampullary portion of the tube was coagulated in 4 contiguous areas. Each time the resistance meter went to 0 and the tube had been retracted away from an adjacent viscera. The  right fallopian tube was then manipulated in a similar fashion. The scope was removed and the excess carbon dioxide was remove through the port, before it was removed.  The subcutaneous layer of the incision was reapproximated with an interrupted figure-of eight 0-Vicryl suture on a UR 6 needle. Dermabond was applied to the skin incision.  The speculum was replaced.  The endocervical canal sounded to 3.5 cm.  The uterine cavity sounded to 9 cm.  The endocervical canal was dilated with Shawnie Pons dilators.  A 5 mm diagnostic hysteroscope with Glycine as the distending medium was used to perform a diagnostic hysteroscopy.  The hysteroscope was removed.  A small, Sims curette was used to perform an endometrial curettage.  The cervical canal was further dilated with Pratt dilators to a #29.  The Novasure device was test-fired and the meter went between 4.5 and 5.  The device was withdrawn into the cylinder and placed into the uterus.  The dorsal fin was set appropriately.  The device was deployed and then seated.  The sleeve was placed up against the endocervix.  A cavity assessment test was performed and passed.  The ablation cycle was completed.  The device was removed and another hysteroscopic examination was performed.  A good result was noted. The instruments were removed from the vagina.  There was minimal bleeding from the cervix. Final sponge, instrument and needle counts were correct. The patient was awakened on the operating table and taken to the PACU in satisfactory condition.    Findings: At laparoscopy, normal pelvic viscera.  At hysteroscopy, thin endometrium.  Synechiae in the right cornu.  Estimated Blood Loss:  Minimal              Total  IV Fluids: per Anesthesiology        Condition: stable

## 2011-07-14 NOTE — H&P (Signed)
  Chief Complaint: 45 y.o.  who presents for a laparoscopic BTL/Novasure ablation  Details of Present Illness: The patient desires a sterilization/endometrial ablation.  BP 109/62  Pulse 60  Temp(Src) 98.2 F (36.8 C) (Oral)  Resp 16  SpO2 100%  Past Medical History  Diagnosis Date  . Pulmonary embolism, blood-clot, obstetric 2002    on birth control-pulm blood clot-treated with coumadin-no problems now   History   Social History  . Marital Status: Married    Spouse Name: N/A    Number of Children: N/A  . Years of Education: N/A   Occupational History  . Not on file.   Social History Main Topics  . Smoking status: Never Smoker   . Smokeless tobacco: Not on file  . Alcohol Use: No  . Drug Use: No  . Sexually Active: Yes   Other Topics Concern  . Not on file   Social History Narrative  . No narrative on file   History reviewed. No pertinent family history.  Pertinent items are noted in HPI.  Pre-Op Diagnosis: sterilization/bleeding   Planned Procedure: Procedure(s): LAPAROSCOPIC TUBAL LIGATION DILATATION & CURETTAGE/HYSTEROSCOPY WITH NOVASURE ABLATION  I have reviewed the patient's history and have completed the physical exam and Bianca Simmons is acceptable for surgery.  Bianca Rainbow, MD 07/14/2011 2:19 PM

## 2011-07-14 NOTE — Anesthesia Preprocedure Evaluation (Signed)
Anesthesia Evaluation  Patient identified by MRN, date of birth, ID band Patient awake    Reviewed: Allergy & Precautions, H&P , Patient's Chart, lab work & pertinent test results, reviewed documented beta blocker date and time   Airway Mallampati: II TM Distance: >3 FB Neck ROM: full    Dental No notable dental hx. (+) Missing,    Pulmonary  clear to auscultation  Pulmonary exam normal       Cardiovascular regular Normal    Neuro/Psych    GI/Hepatic   Endo/Other    Renal/GU      Musculoskeletal   Abdominal   Peds  Hematology   Anesthesia Other Findings   Reproductive/Obstetrics                           Anesthesia Physical Anesthesia Plan  ASA: II  Anesthesia Plan: General   Post-op Pain Management:    Induction: Intravenous  Airway Management Planned: Oral ETT  Additional Equipment:   Intra-op Plan:   Post-operative Plan:   Informed Consent: I have reviewed the patients History and Physical, chart, labs and discussed the procedure including the risks, benefits and alternatives for the proposed anesthesia with the patient or authorized representative who has indicated his/her understanding and acceptance.   Dental Advisory Given  Plan Discussed with: CRNA and Surgeon  Anesthesia Plan Comments: (  Discussed  general anesthesia, including possible nausea, instrumentation of airway, sore throat,pulmonary aspiration, etc. I asked if the were any outstanding questions, or  concerns before we proceeded. )        Anesthesia Quick Evaluation

## 2011-07-14 NOTE — Transfer of Care (Signed)
Immediate Anesthesia Transfer of Care Note  Patient: Bianca Simmons  Procedure(s) Performed:  LAPAROSCOPIC TUBAL LIGATION; DILATATION & CURETTAGE/HYSTEROSCOPY WITH NOVASURE ABLATION  Patient Location: PACU  Anesthesia Type: General  Level of Consciousness: awake, alert  and oriented  Airway & Oxygen Therapy: Patient Spontanous Breathing and Patient connected to nasal cannula oxygen  Post-op Assessment: Report given to PACU RN and Post -op Vital signs reviewed and stable  Post vital signs: Reviewed and stable  Complications: No apparent anesthesia complications

## 2011-07-14 NOTE — Anesthesia Postprocedure Evaluation (Signed)
  Anesthesia Post-op Note  Patient: Bianca Simmons  Procedure(s) Performed:  LAPAROSCOPIC TUBAL LIGATION; DILATATION & CURETTAGE/HYSTEROSCOPY WITH NOVASURE ABLATION  Patient Location: PACU  Anesthesia Type: General  Level of Consciousness: awake, alert  and oriented  Airway and Oxygen Therapy: Patient Spontanous Breathing  Post-op Pain: mild  Post-op Assessment: Post-op Vital signs reviewed, Patient's Cardiovascular Status Stable, Respiratory Function Stable, Patent Airway, No signs of Nausea or vomiting and Pain level controlled  Post-op Vital Signs: Reviewed and stable  Complications: No apparent anesthesia complications

## 2011-07-18 ENCOUNTER — Encounter (HOSPITAL_COMMUNITY): Payer: Self-pay | Admitting: Obstetrics & Gynecology

## 2012-02-24 ENCOUNTER — Emergency Department (HOSPITAL_COMMUNITY)
Admission: EM | Admit: 2012-02-24 | Discharge: 2012-02-24 | Disposition: A | Discharge status: Home / Self Care | Payer: BC Managed Care – PPO | Attending: Emergency Medicine | Admitting: Emergency Medicine

## 2012-02-24 ENCOUNTER — Encounter (HOSPITAL_COMMUNITY): Payer: Self-pay

## 2012-02-24 ENCOUNTER — Emergency Department (HOSPITAL_COMMUNITY): Payer: BC Managed Care – PPO

## 2012-02-24 ENCOUNTER — Ambulatory Visit (INDEPENDENT_AMBULATORY_CARE_PROVIDER_SITE_OTHER): Payer: BC Managed Care – PPO | Admitting: Emergency Medicine

## 2012-02-24 VITALS — BP 113/72 | HR 62 | Temp 98.1°F | Resp 16 | Ht 68.5 in | Wt 205.0 lb

## 2012-02-24 DIAGNOSIS — R270 Ataxia, unspecified: Secondary | ICD-10-CM

## 2012-02-24 DIAGNOSIS — R279 Unspecified lack of coordination: Secondary | ICD-10-CM

## 2012-02-24 DIAGNOSIS — R110 Nausea: Secondary | ICD-10-CM | POA: Insufficient documentation

## 2012-02-24 DIAGNOSIS — R42 Dizziness and giddiness: Secondary | ICD-10-CM

## 2012-02-24 LAB — CBC WITH DIFFERENTIAL/PLATELET
Basophils Absolute: 0 10*3/uL (ref 0.0–0.1)
Basophils Relative: 0 % (ref 0–1)
Eosinophils Absolute: 0.1 10*3/uL (ref 0.0–0.7)
Eosinophils Relative: 2 % (ref 0–5)
HCT: 41.9 % (ref 36.0–46.0)
Hemoglobin: 13.9 g/dL (ref 12.0–15.0)
Lymphocytes Relative: 34 % (ref 12–46)
Lymphs Abs: 1.8 10*3/uL (ref 0.7–4.0)
MCH: 29.9 pg (ref 26.0–34.0)
MCHC: 33.2 g/dL (ref 30.0–36.0)
MCV: 90.1 fL (ref 78.0–100.0)
Monocytes Absolute: 0.4 10*3/uL (ref 0.1–1.0)
Monocytes Relative: 8 % (ref 3–12)
Neutro Abs: 2.8 10*3/uL (ref 1.7–7.7)
Neutrophils Relative %: 55 % (ref 43–77)
Platelets: 238 10*3/uL (ref 150–400)
RBC: 4.65 MIL/uL (ref 3.87–5.11)
RDW: 13.9 % (ref 11.5–15.5)
WBC: 5.1 10*3/uL (ref 4.0–10.5)

## 2012-02-24 LAB — COMPREHENSIVE METABOLIC PANEL
ALT: 11 U/L (ref 0–35)
AST: 18 U/L (ref 0–37)
Albumin: 3.8 g/dL (ref 3.5–5.2)
Alkaline Phosphatase: 103 U/L (ref 39–117)
BUN: 8 mg/dL (ref 6–23)
CO2: 26 mEq/L (ref 19–32)
Calcium: 8.9 mg/dL (ref 8.4–10.5)
Chloride: 103 mEq/L (ref 96–112)
Creatinine, Ser: 0.66 mg/dL (ref 0.50–1.10)
GFR calc Af Amer: >90 mL/min (ref >90)
GFR calc non Af Amer: >90 mL/min (ref >90)
Glucose, Bld: 73 mg/dL (ref 70–99)
Potassium: 3.6 mEq/L (ref 3.5–5.1)
Sodium: 140 mEq/L (ref 135–145)
Total Bilirubin: 0.5 mg/dL (ref 0.3–1.2)
Total Protein: 8.1 g/dL (ref 6.0–8.3)

## 2012-02-24 LAB — URINALYSIS, ROUTINE W REFLEX MICROSCOPIC
Glucose, UA: NEGATIVE mg/dL
Hgb urine dipstick: NEGATIVE
Ketones, ur: NEGATIVE mg/dL
Leukocytes, UA: NEGATIVE
Nitrite: NEGATIVE
Protein, ur: NEGATIVE mg/dL
Specific Gravity, Urine: 1.029 (ref 1.005–1.030)
Urobilinogen, UA: 1 mg/dL (ref 0.0–1.0)
pH: 5.5 (ref 5.0–8.0)

## 2012-02-24 LAB — PREGNANCY, URINE: Preg Test, Ur: NEGATIVE

## 2012-02-24 MED ORDER — MECLIZINE HCL 25 MG PO TABS: 25.0000 mg | ORAL_TABLET | Freq: Three times a day (TID) | ORAL | Status: AC | PRN

## 2012-02-24 NOTE — ED Notes (Signed)
Patient transported to MRI 

## 2012-02-24 NOTE — Progress Notes (Signed)
   Date:  02/24/2012   Name:  Bianca Simmons   DOB:  12-Aug-1965   MRN:  [id number]  PCP:  No primary provider on file.    Chief Complaint: Dizziness   History of Present Illness:  Bianca Simmons is a 46 y.o. very pleasant female patient who presents with the following:  Dizzy for the past three days.  Says the room spins and is associated with visual difficulty and blurring.  No speech disorder, motor symptoms.  Says there balance is a little off although she has not fallen.  No droppiing items, no facial asymmetry..  No antecedent illness or injury.  Patient Active Problem List  Diagnosis  . Contraceptive management  . Abnormal uterine bleeding    Past Medical History  Diagnosis Date  . Pulmonary embolism, blood-clot, obstetric 2002    on birth control-pulm blood clot-treated with coumadin-no problems now    Past Surgical History  Procedure Date  . No past surgeries   . Tubal ligation 04/21/2011    Procedure: ESSURE TUBAL STERILIZATION;  Surgeon: Roseanna Rainbow, MD;  Location: [facility];  Service: Gynecology;  Laterality: Bilateral;   attempted essure,removal of IUD, hysteroscopy,         dilatation and currettage  . Iud removal OCT 2012  . Laparoscopic tubal ligation 07/14/2011    Procedure: LAPAROSCOPIC TUBAL LIGATION;  Surgeon: Roseanna Rainbow, MD;  Location: [facility];  Service: Gynecology;  Laterality: N/A;    History  Substance Use Topics  . Smoking status: Never Smoker   . Smokeless tobacco: Not on file  . Alcohol Use: No    No family history on file.  Allergies  Allergen Reactions  . Sulfa Antibiotics Nausea Only    Medication list has been reviewed and updated.  No current outpatient prescriptions on file prior to visit.    Review of Systems:  As per HPI, otherwise negative.    Physical Examination: Filed Vitals:   02/24/12 1208  BP: 113/72  Pulse: 62  Temp: 98.1 F (36.7 C)  Resp: 16   Filed Vitals:   02/24/12 1208  Height: 5'  8.5" (1.74 m)  Weight: 205 lb (92.987 kg)   Body mass index is 30.72 kg/(m^2). Ideal Body Weight: Weight in (lb) to have BMI = 25: 166.5   GEN: WDWN, NAD, Non-toxic, A & O x 3 HEENT: Atraumatic, Normocephalic. Neck supple. No masses, No LAD. Ears and Nose: No external deformity. CV: RRR, No M/G/R. No JVD. No thrill. No extra heart sounds. PULM: CTA B, no wheezes, crackles, rhonchi. No retractions. No resp. distress. No accessory muscle use. ABD: S, NT, ND, +BS. No rebound. No HSM. EXTR: No c/c/e NEURO romberg normal.  Heel toe impaired.  CN 2-12 intact.  Gross motor intact PSYCH: Normally interactive. Conversant. Not depressed or anxious appearing.  Calm demeanor.    Assessment and Plan: Ataxia and dizziness To ER   Carmelina Dane, MD

## 2012-02-24 NOTE — ED Notes (Signed)
Sent from [facility] for dizzines, sts for three days feels off balance.

## 2012-02-24 NOTE — ED Provider Notes (Signed)
History     CSN: [id number]  Arrival date & time 02/24/12  1342   First MD Initiated Contact with Patient 02/24/12 1641      Chief Complaint  Patient presents with  . Dizziness     Patient is a 46 y.o. female presenting with neurologic complaint. The history is provided by the patient.  Neurologic Problem The primary symptoms include dizziness, visual change and nausea. Primary symptoms do not include headaches, syncope, loss of consciousness, altered mental status, seizures, paresthesias, focal weakness, loss of sensation, speech change, memory loss, fever or vomiting. The symptoms began 3 to 5 days ago. Episode duration: several minutes. The symptoms are improving. The neurological symptoms are diffuse.  Dizziness also occurs with nausea. Dizziness does not occur with vomiting or weakness.  Additional symptoms include loss of balance. Additional symptoms do not include neck stiffness, weakness, nystagmus or hearing loss.  pt reports feeling "the world is spinning" intermittently for past 3 days No falls/syncope/head trauma No HA No focal weakness No seizures She has never had this before No h/o CAD/CVA Reports distant h/o PE due to OCP, no longer on coumadin No hearing loss She reports blurred vision but no visual loss No cp/sob Past Medical History  Diagnosis Date  . Pulmonary embolism, blood-clot, obstetric 2002    on birth control-pulm blood clot-treated with coumadin-no problems now    Past Surgical History  Procedure Date  . No past surgeries   . Tubal ligation 04/21/2011    Procedure: ESSURE TUBAL STERILIZATION;  Surgeon: Roseanna Rainbow, MD;  Location: [facility];  Service: Gynecology;  Laterality: Bilateral;   attempted essure,removal of IUD, hysteroscopy,         dilatation and currettage  . Iud removal OCT 2012  . Laparoscopic tubal ligation 07/14/2011    Procedure: LAPAROSCOPIC TUBAL LIGATION;  Surgeon: Roseanna Rainbow, MD;  Location: [facility];  Service:  Gynecology;  Laterality: N/A;    History reviewed. No pertinent family history.  History  Substance Use Topics  . Smoking status: Never Smoker   . Smokeless tobacco: Not on file  . Alcohol Use: No    OB History    Grav Para Term Preterm Abortions TAB SAB Ect Mult Living                  Review of Systems  Constitutional: Negative for fever.  HENT: Negative for hearing loss and neck stiffness.   Cardiovascular: Negative for syncope.  Gastrointestinal: Positive for nausea. Negative for vomiting.  Neurological: Positive for dizziness and loss of balance. Negative for speech change, focal weakness, seizures, loss of consciousness, weakness, headaches and paresthesias.  Psychiatric/Behavioral: Negative for memory loss and altered mental status.  All other systems reviewed and are negative.    Allergies  Sulfa antibiotics  Home Medications   Current Outpatient Rx  Name Route Sig Dispense Refill  . GARCINIA CAMBOGIA-CHROMIUM PO Oral Take 1 tablet by mouth 2 (two) times daily.      BP 101/56  Pulse 58  Temp 98.5 F (36.9 C) (Axillary)  Resp 20  SpO2 100%  LMP 02/17/2012  Physical Exam CONSTITUTIONAL: Well developed/well nourished HEAD AND FACE: Normocephalic/atraumatic EYES: EOMI/PERRL ENMT: Mucous membranes moist NECK: supple no meningeal signs, no bruits SPINE:entire spine nontender CV: S1/S2 noted, no murmurs/rubs/gallops noted LUNGS: Lungs are clear to auscultation bilaterally, no apparent distress ABDOMEN: soft, nontender, no rebound or guarding GU:no cva tenderness NEURO: Awake/alert, facies symmetric, no arm or leg drift is noted Cranial nerves  3/4/5/6/02/05/09/11/12 tested and intact Gait normal No past pointing EXTREMITIES: pulses normal, full ROM SKIN: warm, color normal PSYCH: no abnormalities of mood noted  ED Course  Procedures  Labs Reviewed  URINALYSIS, ROUTINE W REFLEX MICROSCOPIC - Abnormal; Notable for the following:    APPearance CLOUDY  (*)     Bilirubin Urine SMALL (*)     All other components within normal limits  CBC WITH DIFFERENTIAL  COMPREHENSIVE METABOLIC PANEL  PREGNANCY, URINE  5:57 PM Pt with vertigo for 3 days She is now at baseline, no neuro deficits. However she noted to have ataxia at urgent care sent for mri Currently gait is unremarkable and no ataxia noted   7:25 PM Mri negative Pt feels well, improved Stable for d/c We discussed strict return precautions including severe headache, focal weakness, or if she is unable to walk over the next 24-48 hours  MDM  Nursing notes including past medical history and social history reviewed and considered in documentation All labs/vitals reviewed and considered Previous records reviewed and considered - sent form urgent care for mri for difficulty ambulating at urgent care        Date: 02/24/2012  Rate: 56  Rhythm: sinus bradycardia  QRS Axis: normal  Intervals: normal  ST/T Wave abnormalities: nonspecific ST changes  Conduction Disutrbances:none    Joya Gaskins, MD 02/24/12 1926

## 2012-02-24 NOTE — ED Notes (Signed)
Back from MRI scan Or

## 2012-02-24 NOTE — ED Notes (Signed)
Patient says she has been dizzy for the past three days.  Patient says it comes and goes away.  Yesterday she felt sick at work and by the time she got off she felt better and decided to go to Urgent Care today.  Urgent care physician sent her here.

## 2012-02-24 NOTE — ED Notes (Signed)
Back from MRI voices no complaints at this time. Her husband is at the bedside.

## 2012-02-24 NOTE — ED Notes (Signed)
Placed on all cardiac monitors. 

## 2012-02-24 NOTE — ED Notes (Signed)
Report to Kelly RN

## 2012-03-29 ENCOUNTER — Ambulatory Visit (INDEPENDENT_AMBULATORY_CARE_PROVIDER_SITE_OTHER): Payer: BC Managed Care – PPO | Admitting: Emergency Medicine

## 2012-03-29 VITALS — BP 122/75 | HR 78 | Temp 98.2°F | Resp 20 | Ht 68.0 in | Wt 204.0 lb

## 2012-03-29 DIAGNOSIS — M722 Plantar fascial fibromatosis: Secondary | ICD-10-CM

## 2012-03-29 DIAGNOSIS — Z111 Encounter for screening for respiratory tuberculosis: Secondary | ICD-10-CM

## 2012-03-29 MED ORDER — NAPROXEN SODIUM 550 MG PO TABS: 550.0000 mg | ORAL_TABLET | Freq: Two times a day (BID) | ORAL | Status: DC

## 2012-03-29 NOTE — Progress Notes (Signed)
   Date:  03/29/2012   Name:  Bianca Simmons   DOB:  [birth year]   MRN:  [id number] Gender: female Age: 46 y.o.  PCP:  Paulino Rily, MD    Chief Complaint: PPD Reading and Foot Pain   History of Present Illness:  Bianca Simmons is a 46 y.o. pleasant patient who presents with the following:  History of plantar fasciitis which is worsening. Pain pretty intense on arising and diminishes during the day but never goes away.  No history of injury.  Patient Active Problem List  Diagnosis  . Contraceptive management  . Abnormal uterine bleeding    Past Medical History  Diagnosis Date  . Pulmonary embolism, blood-clot, obstetric 2002    on birth control-pulm blood clot-treated with coumadin-no problems now    Past Surgical History  Procedure Date  . No past surgeries   . Tubal ligation 04/21/2011    Procedure: ESSURE TUBAL STERILIZATION;  Surgeon: Roseanna Rainbow, MD;  Location: [facility];  Service: Gynecology;  Laterality: Bilateral;   attempted essure,removal of IUD, hysteroscopy,         dilatation and currettage  . Iud removal OCT 2012  . Laparoscopic tubal ligation 07/14/2011    Procedure: LAPAROSCOPIC TUBAL LIGATION;  Surgeon: Roseanna Rainbow, MD;  Location: [facility];  Service: Gynecology;  Laterality: N/A;    History  Substance Use Topics  . Smoking status: Never Smoker   . Smokeless tobacco: Not on file  . Alcohol Use: No    No family history on file.  Allergies  Allergen Reactions  . Sulfa Antibiotics Nausea Only    Medication list has been reviewed and updated.  Current Outpatient Prescriptions on File Prior to Visit  Medication Sig Dispense Refill  . GARCINIA CAMBOGIA-CHROMIUM PO Take 1 tablet by mouth 2 (two) times daily.        Review of Systems:  As per HPI, otherwise negative.    Physical Examination: Filed Vitals:   03/29/12 1544  BP: 122/75  Pulse: 78  Temp: 98.2 F (36.8 C)  Resp: 20   Filed Vitals:   03/29/12 1544  Height: 5'  8" (1.727 m)  Weight: 204 lb (92.534 kg)   Body mass index is 31.02 kg/(m^2). Ideal Body Weight: Weight in (lb) to have BMI = 25: 164.1    GEN: WDWN, NAD, Non-toxic, Alert & Oriented x 3 HEENT: Atraumatic, Normocephalic.  Ears and Nose: No external deformity. EXTR: No clubbing/cyanosis/edema NEURO: Normal gait.  PSYCH: Normally interactive. Conversant. Not depressed or anxious appearing.  Calm demeanor.  Foot:  Tender anterior to calcaneus on left foot.  No deformity, ecchymosis or instability.  NATI.  Assessment and Plan: Plantar fasciitis TB test Anaprox Follow up with podiatry  Carmelina Dane, MD

## 2012-03-31 ENCOUNTER — Ambulatory Visit (INDEPENDENT_AMBULATORY_CARE_PROVIDER_SITE_OTHER): Payer: BC Managed Care – PPO

## 2012-03-31 DIAGNOSIS — Z111 Encounter for screening for respiratory tuberculosis: Secondary | ICD-10-CM

## 2012-03-31 LAB — TB SKIN TEST
Induration: 0 mm
TB Skin Test: NEGATIVE

## 2012-06-07 ENCOUNTER — Inpatient Hospital Stay (HOSPITAL_COMMUNITY)
Admission: EM | Admit: 2012-06-07 | Discharge: 2012-06-09 | DRG: 541 | Disposition: A | Discharge status: Home / Self Care | Payer: BC Managed Care – PPO | Attending: Internal Medicine | Admitting: Internal Medicine

## 2012-06-07 ENCOUNTER — Encounter (HOSPITAL_COMMUNITY): Payer: Self-pay | Admitting: Emergency Medicine

## 2012-06-07 ENCOUNTER — Emergency Department (HOSPITAL_COMMUNITY): Payer: BC Managed Care – PPO

## 2012-06-07 DIAGNOSIS — Z823 Family history of stroke: Secondary | ICD-10-CM

## 2012-06-07 DIAGNOSIS — N939 Abnormal uterine and vaginal bleeding, unspecified: Secondary | ICD-10-CM

## 2012-06-07 DIAGNOSIS — Z86718 Personal history of other venous thrombosis and embolism: Secondary | ICD-10-CM

## 2012-06-07 DIAGNOSIS — R0602 Shortness of breath: Secondary | ICD-10-CM | POA: Diagnosis present

## 2012-06-07 DIAGNOSIS — I824Y9 Acute embolism and thrombosis of unspecified deep veins of unspecified proximal lower extremity: Secondary | ICD-10-CM | POA: Diagnosis present

## 2012-06-07 DIAGNOSIS — Z882 Allergy status to sulfonamides status: Secondary | ICD-10-CM

## 2012-06-07 DIAGNOSIS — I2699 Other pulmonary embolism without acute cor pulmonale: Secondary | ICD-10-CM

## 2012-06-07 DIAGNOSIS — I824Z9 Acute embolism and thrombosis of unspecified deep veins of unspecified distal lower extremity: Secondary | ICD-10-CM | POA: Diagnosis present

## 2012-06-07 DIAGNOSIS — I82409 Acute embolism and thrombosis of unspecified deep veins of unspecified lower extremity: Secondary | ICD-10-CM | POA: Diagnosis present

## 2012-06-07 DIAGNOSIS — Z86711 Personal history of pulmonary embolism: Secondary | ICD-10-CM

## 2012-06-07 HISTORY — DX: Shortness of breath: R06.02

## 2012-06-07 HISTORY — DX: Other pulmonary embolism without acute cor pulmonale: I26.99

## 2012-06-07 LAB — CBC WITH DIFFERENTIAL/PLATELET
Basophils Absolute: 0 10*3/uL (ref 0.0–0.1)
Basophils Relative: 0 % (ref 0–1)
Eosinophils Absolute: 0.2 10*3/uL (ref 0.0–0.7)
Eosinophils Relative: 3 % (ref 0–5)
HCT: 37.6 % (ref 36.0–46.0)
Hemoglobin: 12.9 g/dL (ref 12.0–15.0)
Lymphocytes Relative: 28 % (ref 12–46)
Lymphs Abs: 2.2 10*3/uL (ref 0.7–4.0)
MCH: 30.5 pg (ref 26.0–34.0)
MCHC: 34.3 g/dL (ref 30.0–36.0)
MCV: 88.9 fL (ref 78.0–100.0)
Monocytes Absolute: 0.3 10*3/uL (ref 0.1–1.0)
Monocytes Relative: 4 % (ref 3–12)
Neutro Abs: 4.9 10*3/uL (ref 1.7–7.7)
Neutrophils Relative %: 65 % (ref 43–77)
Platelets: 184 10*3/uL (ref 150–400)
RBC: 4.23 MIL/uL (ref 3.87–5.11)
RDW: 12.8 % (ref 11.5–15.5)
WBC: 7.7 10*3/uL (ref 4.0–10.5)

## 2012-06-07 LAB — COMPREHENSIVE METABOLIC PANEL
ALT: 13 U/L (ref 0–35)
AST: 18 U/L (ref 0–37)
Albumin: 3.6 g/dL (ref 3.5–5.2)
Alkaline Phosphatase: 101 U/L (ref 39–117)
BUN: 13 mg/dL (ref 6–23)
CO2: 25 mEq/L (ref 19–32)
Calcium: 9.1 mg/dL (ref 8.4–10.5)
Chloride: 102 mEq/L (ref 96–112)
Creatinine, Ser: 0.69 mg/dL (ref 0.50–1.10)
GFR calc Af Amer: >90 mL/min (ref >90)
GFR calc non Af Amer: >90 mL/min (ref >90)
Glucose, Bld: 120 mg/dL — ABNORMAL HIGH (ref 70–99)
Potassium: 3.2 mEq/L — ABNORMAL LOW (ref 3.5–5.1)
Sodium: 137 mEq/L (ref 135–145)
Total Bilirubin: 0.3 mg/dL (ref 0.3–1.2)
Total Protein: 8 g/dL (ref 6.0–8.3)

## 2012-06-07 LAB — MRSA PCR SCREENING: MRSA by PCR: NEGATIVE

## 2012-06-07 LAB — GLUCOSE, CAPILLARY: Glucose-Capillary: 87 mg/dL (ref 70–99)

## 2012-06-07 LAB — D-DIMER, QUANTITATIVE: D-Dimer, Quant: 11.9 ug/mL-FEU — ABNORMAL HIGH (ref 0.00–0.48)

## 2012-06-07 MED ORDER — HEPARIN BOLUS VIA INFUSION
4000.0000 [IU] | Freq: Once | INTRAVENOUS | Status: AC
  Administered 2012-06-07: 4000 [IU] via INTRAVENOUS

## 2012-06-07 MED ORDER — SODIUM CHLORIDE 0.9 % IV SOLN: 250.0000 mL | INTRAVENOUS | Status: DC | PRN

## 2012-06-07 MED ORDER — HEPARIN (PORCINE) IN NACL 100-0.45 UNIT/ML-% IJ SOLN
1000.0000 [IU]/h | INTRAMUSCULAR | Status: DC
  Administered 2012-06-07: 1000 [IU]/h via INTRAVENOUS
  Filled 2012-06-07: qty 250

## 2012-06-07 MED ORDER — SODIUM CHLORIDE 0.9 % IJ SOLN
3.0000 mL | Freq: Two times a day (BID) | INTRAMUSCULAR | Status: DC
  Administered 2012-06-08 – 2012-06-09 (×3): 3 mL via INTRAVENOUS

## 2012-06-07 MED ORDER — SODIUM CHLORIDE 0.9 % IJ SOLN: 3.0000 mL | INTRAMUSCULAR | Status: DC | PRN

## 2012-06-07 MED ORDER — ONDANSETRON HCL 4 MG/2ML IJ SOLN: 4.0000 mg | Freq: Three times a day (TID) | INTRAMUSCULAR | Status: DC | PRN

## 2012-06-07 MED ORDER — IOHEXOL 350 MG/ML SOLN
100.0000 mL | Freq: Once | INTRAVENOUS | Status: AC | PRN
  Administered 2012-06-07: 100 mL via INTRAVENOUS

## 2012-06-07 NOTE — ED Provider Notes (Signed)
History     CSN: [id number]  Arrival date & time 06/07/12  1355   First MD Initiated Contact with Patient 06/07/12 1607      No chief complaint on file.    HPI Patient presents with two-day history of shortness of breath with exertion.  Patient denies wheezing or cough.  Denies fever chills.  Denies chest pain.  Patient has pertinent past history of lumbar embolism approximately 10 years ago.  She currently is not on birth control pills.  She has had some swelling to her right ankle but denies calf or thigh swelling on the right side. Past Medical History  Diagnosis Date  . Pulmonary embolism, blood-clot, obstetric 2002    on birth control-pulm blood clot-treated with coumadin-no problems now    Past Surgical History  Procedure Date  . No past surgeries   . Tubal ligation 04/21/2011    Procedure: ESSURE TUBAL STERILIZATION;  Surgeon: Roseanna Rainbow, MD;  Location: [facility];  Service: Gynecology;  Laterality: Bilateral;   attempted essure,removal of IUD, hysteroscopy,         dilatation and currettage  . Iud removal OCT 2012  . Laparoscopic tubal ligation 07/14/2011    Procedure: LAPAROSCOPIC TUBAL LIGATION;  Surgeon: Roseanna Rainbow, MD;  Location: [facility];  Service: Gynecology;  Laterality: N/A;    No family history on file.  History  Substance Use Topics  . Smoking status: Never Smoker   . Smokeless tobacco: Not on file  . Alcohol Use: No    OB History    Grav Para Term Preterm Abortions TAB SAB Ect Mult Living                  Review of Systems All other systems reviewed and are negative Allergies  Sulfa antibiotics  Home Medications   No current outpatient prescriptions on file.  BP 118/77  Pulse 81  Temp 97.6 F (36.4 C) (Oral)  Resp 20  SpO2 98%  Physical Exam  Nursing note and vitals reviewed. Constitutional: She is oriented to person, place, and time. She appears well-developed and well-nourished. No distress.  HENT:  Head: Normocephalic  and atraumatic.  Eyes: Pupils are equal, round, and reactive to light.  Neck: Normal range of motion.  Cardiovascular: Normal rate and intact distal pulses.   Pulmonary/Chest: No respiratory distress.  Abdominal: Normal appearance. She exhibits no distension.  Musculoskeletal: Normal range of motion.  Neurological: She is alert and oriented to person, place, and time. No cranial nerve deficit.  Skin: Skin is warm and dry. No rash noted.  Psychiatric: She has a normal mood and affect. Her behavior is normal.    ED Course  Procedures (including critical care time) EKG  Date: 06/07/2012  Rate: 86  Rhythm: normal sinus rhythm  QRS Axis: normal  Intervals: normal  ST/T Wave abnormalities: normal  Conduction Disutrbances: none  Narrative Interpretation: unremarkable   CRITICAL CARE Performed by: Kendricks Reap L   Total critical care time: 30 min  Critical care time was exclusive of separately billable procedures and treating other patients.  Critical care was necessary to treat or prevent imminent or life-threatening deterioration.  Critical care was time spent personally by me on the following activities: development of treatment plan with patient and/or surrogate as well as nursing, discussions with consultants, evaluation of patient's response to treatment, examination of patient, obtaining history from patient or surrogate, ordering and performing treatments and interventions, ordering and review of laboratory studies, ordering and review of  radiographic studies, pulse oximetry and re-evaluation of patient's condition.    Medications  heparin ADULT infusion 100 units/mL (25000 units/250 mL) (1000 Units/hr Intravenous New Bag/Given 06/07/12 1817)  iohexol (OMNIPAQUE) 350 MG/ML injection 100 mL (100 mL Intravenous Contrast Given 06/07/12 1733)  heparin bolus via infusion 4,000 Units (4000 Units Intravenous Bolus from Bag 06/07/12 1818)     Labs Reviewed  COMPREHENSIVE  METABOLIC PANEL - Abnormal; Notable for the following:    Potassium 3.2 (*)     Glucose, Bld 120 (*)     All other components within normal limits  D-DIMER, QUANTITATIVE - Abnormal; Notable for the following:    D-Dimer, Quant 11.90 (*)     All other components within normal limits  CBC WITH DIFFERENTIAL   Dg Chest 2 View  06/07/2012  *RADIOLOGY REPORT*  Clinical Data: Shortness of breath for 3 - 4 days.  CHEST - 2 VIEW  Comparison: CT angio chest 03/10/2004  Findings: Cardiomediastinal silhouette is within normal limits. There are no focal consolidations or pleural effusions.  No pulmonary edema.  Mild mid thoracic degenerative changes are present.  IMPRESSION: No evidence for acute cardiopulmonary abnormality.   Original Report Authenticated By: Norva Pavlov, M.D.    Ct Angio Chest Pe W/cm &/or Wo Cm  06/07/2012  *RADIOLOGY REPORT*  Clinical Data: Shortness of breath with exertion.  Elevated D- dimer.  History of pulmonary embolus.  CT ANGIOGRAPHY CHEST  Technique:  Multidetector CT imaging of the chest using the standard protocol during bolus administration of intravenous contrast. Multiplanar reconstructed images including MIPs were obtained and reviewed to evaluate the vascular anatomy.  Contrast: OMNIPAQUE IOHEXOL 350 MG/ML SOLN  Comparison: Multiple exams, including 03/10/2004  Findings: Large amount of bilateral pulmonary embolus noted involving all lobes.  Most of this appears acute and clot burden is high.  No reversal of the interventricular septal contour.  There is only minimal reflux of contrast medium into the IVC.  Scattered upper mediastinal lymph nodes are in the upper normal size range.  Mild atelectasis noted posteriorly in the right upper lobe.  There is faint interstitial accentuation in the upper lobes.  Thoracic spondylosis noted.  IMPRESSION:  1.  Large bilateral pulmonary emboli involving all lobes.  Clot burden is high.  Critical Value/emergent results were called by  telephone to Dr. Nelva Nay (who verbally acknowledged these results) at the time of interpretation on 06/07/2012 at 5:56 p.m.   Original Report Authenticated By: Gaylyn Rong, M.D.      1. Pulmonary embolism       MDM   Critical care medicine contacted and will consult.       Nelia Shi, MD 06/07/12 [phone]

## 2012-06-07 NOTE — Consult Note (Addendum)
Name: Bianca Simmons MRN: [id number] DOB: [birth year]    LOS: 0  Referring Provider:  Dr. Radford Pax, Emergency Department Reason for Referral: Pulmonary Embolism  PULMONARY / CRITICAL CARE MEDICINE  HPI:   46 yo F with history of PE presents to the ED with 2 days of dyspnea on exertion but not at rest.  She also has mild chest discomfort.  Denies fever, chills, weight loss, syncope, palpitations, diaphoresis or nausea/vomiting.  She denies any calf pain, LE swelling, recent prolonged car rides or air travel.  In the ED work-up was notable for elevated d-dimer and CTA showing bilateral PE.  Was placed on 2 L [place] however did not have desaturations per RN and patient.  Prior to past 2 days she was without DOE or CP with any type of exertion.  Last PE was greater than 10 years ago and attributed to OCPs.  She was treated with at least 6 months of coumadin.     Past Medical History  Diagnosis Date  . Pulmonary embolism, blood-clot, obstetric 2002    on birth control-pulm blood clot-treated with coumadin-no problems now   Past Surgical History  Procedure Date  . No past surgeries   . Tubal ligation 04/21/2011    Procedure: ESSURE TUBAL STERILIZATION;  Surgeon: Roseanna Rainbow, MD;  Location: [facility];  Service: Gynecology;  Laterality: Bilateral;   attempted essure,removal of IUD, hysteroscopy,         dilatation and currettage  . Iud removal OCT 2012  . Laparoscopic tubal ligation 07/14/2011    Procedure: LAPAROSCOPIC TUBAL LIGATION;  Surgeon: Roseanna Rainbow, MD;  Location: [facility];  Service: Gynecology;  Laterality: N/A;   Prior to Admission medications   Not on File   Allergies Allergies  Allergen Reactions  . Sulfa Antibiotics Nausea Only    Family History No history of PE or DVT in family.  Sister did have stroke in 52s.  Social History  reports that she has never smoked. She does not have any smokeless tobacco history on file. She reports that she does not drink alcohol  or use illicit drugs.  Review Of Systems:  Complete review of 10 systems negative except as listed in HPI.  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   Current Status:  Vital Signs: Temp:  [97.6 F (36.4 C)-99 F (37.2 C)] 97.6 F (36.4 C) (11/08 1613) Pulse Rate:  [68-105] 68  (11/08 1845) Resp:  [18-24] 20  (11/08 1815) BP: (111-141)/(50-77) 117/57 mmHg (11/08 1845) SpO2:  [95 %-100 %] 100 % (11/08 1845)  Physical Examination: General:   NAD Neuro:  Awake, alert, oriented x3, equal str in all 4 extremities HEENT:  EOMI, PERRL, sclera clear, MMM, no oral lesions Neck:  Supple, no cervical or suprclav LAD, no thyromegaly Cardiovascular:  RRR, no M/r/g Lungs:  CTAB, nl WOB Abdomen:  Soft, NT, ND Musculoskeletal:  joints wnl Skin:  No rash Extremities: no calf tenderness or edema   Active Problems:  * No active hospital problems. 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  Currently with mild symptoms and stable VS (lack of hypotension, tachycardia, and hypoxia is reassuring). - Agree with anticoagulation.  Can transition to therapeutic lovenox soon. - Will likely need long term anticoagulation given recurrent unprovoked PE. - Consider hypercoaguable work-up. - Do not think risk of thrombolytics is warranted given stability. -  Should be monitored on tele on floor or stepdown. - Will sign off, please contact us with any questions.  [name], M.D. Pulmonary and Critical Care Medicine [facility] Pager: [phone]  06/07/2012, 7:31 PM

## 2012-06-07 NOTE — Progress Notes (Signed)
ANTICOAGULATION CONSULT NOTE - Initial Consult  Pharmacy Consult for heparin Indication: pulmonary embolus  Allergies  Allergen Reactions  . Sulfa Antibiotics Nausea Only    Patient Measurements:   Heparin Dosing Weight: 83.9  Vital Signs: Temp: 97.6 F (36.4 C) (11/08 1613) Temp src: Oral (11/08 1613) BP: 124/82 mmHg (11/08 2015) Pulse Rate: 74  (11/08 2015)  Labs:  Basename 06/07/12 1408  HGB 12.9  HCT 37.6  PLT 184  APTT --  LABPROT --  INR --  HEPARINUNFRC --  CREATININE 0.69  CKTOTAL --  CKMB --  TROPONINI --    The CrCl is unknown because both a height and weight (above a minimum accepted value) are required for this calculation.   Medical History: Past Medical History  Diagnosis Date  . Pulmonary embolism, blood-clot, obstetric 2002    on birth control-pulm blood clot-treated with coumadin-no problems now    Medications:  No prescriptions prior to admission    Assessment: 30 yof presented to the ED with SOB. Heparin was initiated by the ED physician with a bolus of 4000 and gtt rate of 1000 units/hr. Pharmacy will manage follow-up levels and dosing. H/H is 12.9/37.6, plts 184. She was not on any anticoagulation PTA but does have a history of PE.  Goal of Therapy:  Heparin level 0.3-0.7 units/ml Monitor platelets by anticoagulation protocol: Yes   Plan:  1. Continue heparin as ordered by MD at 1000 units/hr 2. Check a 6 hour heparin level 3. Daily heparin level and CBC 4. F/u start of oral AC  Mycheal Veldhuizen, Drake Leach 06/07/2012,9:07 PM

## 2012-06-07 NOTE — ED Notes (Signed)
REPORT GIVEN TO FLOOR NURSE - 330 UNIT , TRANSPORTED IN STABLE CONDITION , RESPIRATIONS UNLABORED , IV SITE UNREMARKABLE , NO CHEST PAIN .

## 2012-06-07 NOTE — ED Notes (Signed)
Pt reports SOB with exertion. Previous hx of PE. EKG done. Patient upgraded to acuity level 2.

## 2012-06-07 NOTE — H&P (Signed)
PCP:   Paulino Rily, MD   Chief Complaint:  Sob x 2 days  HPI: 46 yo healthy female with h/o PE over 10 years ago while on birth control was treated adequately at that time with coumadin comes in with 2 days of worsening sob.  No cp.  No cough.  No hemoptysis.  No fevers.  No le edema or swelling.  Not on any hormone replacement.  Did recently take dr oz's diet pill which is a herbal medicine which is the only new drug she has used.  No recent trrauma, surgery or traveling.    Review of Systems:  O/w neg  Past Medical History: Past Medical History  Diagnosis Date  . Pulmonary embolism, blood-clot, obstetric 2002    on birth control-pulm blood clot-treated with coumadin-no problems now   Past Surgical History  Procedure Date  . No past surgeries   . Tubal ligation 04/21/2011    Procedure: ESSURE TUBAL STERILIZATION;  Surgeon: Roseanna Rainbow, MD;  Location: [facility];  Service: Gynecology;  Laterality: Bilateral;   attempted essure,removal of IUD, hysteroscopy,         dilatation and currettage  . Iud removal OCT 2012  . Laparoscopic tubal ligation 07/14/2011    Procedure: LAPAROSCOPIC TUBAL LIGATION;  Surgeon: Roseanna Rainbow, MD;  Location: [facility];  Service: Gynecology;  Laterality: N/A;    Medications: Prior to Admission medications   Not on File    Allergies:   Allergies  Allergen Reactions  . Sulfa Antibiotics Nausea Only    Social History:  reports that she has never smoked. She does not have any smokeless tobacco history on file. She reports that she does not drink alcohol or use illicit drugs.  Family History: History reviewed. No pertinent family history.  Physical Exam: Filed Vitals:   06/07/12 1815 06/07/12 1845 06/07/12 1930 06/07/12 2015  BP: 118/77 117/57 108/64 124/82  Pulse: 81 68 74 74  Temp:      TempSrc:      Resp: 20     SpO2: 98% 100% 99% 99%   General appearance: alert, cooperative and no distress Neck: no JVD and supple,  symmetrical, trachea midline Lungs: clear to auscultation bilaterally Heart: regular rate and rhythm, S1, S2 normal, no murmur, click, rub or gallop Abdomen: soft, non-tender; bowel sounds normal; no masses,  no organomegaly Extremities: extremities normal, atraumatic, no cyanosis or edema Pulses: 2+ and symmetric Skin: Skin color, texture, turgor normal. No rashes or lesions Neurologic: Grossly normal    Labs on Admission:   [facility] 06/07/12 1408  NA 137  K 3.2*  CL 102  CO2 25  GLUCOSE 120*  BUN 13  CREATININE 0.69  CALCIUM 9.1  MG --  PHOS --    Basename 06/07/12 1408  AST 18  ALT 13  ALKPHOS 101  BILITOT 0.3  PROT 8.0  ALBUMIN 3.6    Basename 06/07/12 1408  WBC 7.7  NEUTROABS 4.9  HGB 12.9  HCT 37.6  MCV 88.9  PLT 184    Radiological Exams on Admission: Dg Chest 2 View  06/07/2012  *RADIOLOGY REPORT*  Clinical Data: Shortness of breath for 3 - 4 days.  CHEST - 2 VIEW  Comparison: CT angio chest 03/10/2004  Findings: Cardiomediastinal silhouette is within normal limits. There are no focal consolidations or pleural effusions.  No pulmonary edema.  Mild mid thoracic degenerative changes are present.  IMPRESSION: No evidence for acute cardiopulmonary abnormality.   Original Report Authenticated By: Lanora Manis  Manson Passey, M.D.    Ct Angio Chest Pe W/cm &/or Wo Cm  06/07/2012  *RADIOLOGY REPORT*  Clinical Data: Shortness of breath with exertion.  Elevated D- dimer.  History of pulmonary embolus.  CT ANGIOGRAPHY CHEST  Technique:  Multidetector CT imaging of the chest using the standard protocol during bolus administration of intravenous contrast. Multiplanar reconstructed images including MIPs were obtained and reviewed to evaluate the vascular anatomy.  Contrast: OMNIPAQUE IOHEXOL 350 MG/ML SOLN  Comparison: Multiple exams, including 03/10/2004  Findings: Large amount of bilateral pulmonary embolus noted involving all lobes.  Most of this appears acute and clot burden  is high.  No reversal of the interventricular septal contour.  There is only minimal reflux of contrast medium into the IVC.  Scattered upper mediastinal lymph nodes are in the upper normal size range.  Mild atelectasis noted posteriorly in the right upper lobe.  There is faint interstitial accentuation in the upper lobes.  Thoracic spondylosis noted.  IMPRESSION:  1.  Large bilateral pulmonary emboli involving all lobes.  Clot burden is high.  Critical Value/emergent results were called by telephone to Dr. Nelva Nay (who verbally acknowledged these results) at the time of interpretation on 06/07/2012 at 5:56 p.m.   Original Report Authenticated By: Gaylyn Rong, M.D.     Assessment/Plan Present on Admission:  47 yo female with prev h/o pe comes in with new bilateral extensive pe . SOB (shortness of breath) . Pulmonary embolism, bilateral . Pulmonary embolism 10 yrs ago  Heparin gtt.  Stepdown for close monitoring.  All vss oxgyen normal.  Seen by ccm already.  Will need lifelong anticoagulation.  ?eventual full hypercoag panel really for her children to be tested if needed, has 3 young sons.  Long term tx cost needs to be considered with new anticoagulants, don't know how expensive they are which will need to be determined cost wise what the pt is willing to do.  Full code.   Dwane Andres A 06/07/2012, 9:18 PM

## 2012-06-07 NOTE — ED Notes (Signed)
Sob x 2 days and esp on exertion like  Walking up stairs no cough  Legs and arms achy no fever or chills has had a h/a

## 2012-06-07 NOTE — ED Notes (Signed)
Placed on 02 via N/C at 2 Liters

## 2012-06-08 DIAGNOSIS — I82409 Acute embolism and thrombosis of unspecified deep veins of unspecified lower extremity: Secondary | ICD-10-CM

## 2012-06-08 DIAGNOSIS — I2699 Other pulmonary embolism without acute cor pulmonale: Secondary | ICD-10-CM

## 2012-06-08 DIAGNOSIS — R0602 Shortness of breath: Secondary | ICD-10-CM

## 2012-06-08 HISTORY — DX: Acute embolism and thrombosis of unspecified deep veins of unspecified lower extremity: I82.409

## 2012-06-08 LAB — HOMOCYSTEINE: Homocysteine: 8.2 umol/L (ref 4.0–15.4)

## 2012-06-08 LAB — CBC
HCT: 33.8 % — ABNORMAL LOW (ref 36.0–46.0)
Hemoglobin: 11.4 g/dL — ABNORMAL LOW (ref 12.0–15.0)
MCH: 30.2 pg (ref 26.0–34.0)
MCHC: 33.7 g/dL (ref 30.0–36.0)
MCV: 89.7 fL (ref 78.0–100.0)
Platelets: 173 10*3/uL (ref 150–400)
RBC: 3.77 MIL/uL — ABNORMAL LOW (ref 3.87–5.11)
RDW: 13.2 % (ref 11.5–15.5)
WBC: 6.5 10*3/uL (ref 4.0–10.5)

## 2012-06-08 LAB — BASIC METABOLIC PANEL
BUN: 10 mg/dL (ref 6–23)
CO2: 24 mEq/L (ref 19–32)
Calcium: 8.2 mg/dL — ABNORMAL LOW (ref 8.4–10.5)
Chloride: 105 mEq/L (ref 96–112)
Creatinine, Ser: 0.65 mg/dL (ref 0.50–1.10)
GFR calc Af Amer: >90 mL/min (ref >90)
GFR calc non Af Amer: >90 mL/min (ref >90)
Glucose, Bld: 89 mg/dL (ref 70–99)
Potassium: 3.4 mEq/L — ABNORMAL LOW (ref 3.5–5.1)
Sodium: 138 mEq/L (ref 135–145)

## 2012-06-08 LAB — APTT
aPTT: 64 seconds — ABNORMAL HIGH (ref 24–37)
aPTT: 26 seconds (ref 24–37)
aPTT: 45 seconds — ABNORMAL HIGH (ref 24–37)

## 2012-06-08 LAB — HEPARIN LEVEL (UNFRACTIONATED): Heparin Unfractionated: 0.25 IU/mL — ABNORMAL LOW (ref 0.30–0.70)

## 2012-06-08 LAB — PROTIME-INR
INR: 1.27 (ref 0.00–1.49)
Prothrombin Time: 15.6 seconds — ABNORMAL HIGH (ref 11.6–15.2)

## 2012-06-08 LAB — ANTITHROMBIN III: AntiThromb III Func: 80 % (ref 75–120)

## 2012-06-08 LAB — TROPONIN I
Troponin I: <0.3 ng/mL (ref <0.30)
Troponin I: <0.3 ng/mL (ref <0.30)
Troponin I: <0.3 ng/mL (ref <0.30)

## 2012-06-08 MED ORDER — RIVAROXABAN 15 MG PO TABS: 15.0000 mg | ORAL_TABLET | Freq: Two times a day (BID) | ORAL | Status: DC

## 2012-06-08 MED ORDER — RIVAROXABAN 15 MG PO TABS
15.0000 mg | ORAL_TABLET | Freq: Two times a day (BID) | ORAL | Status: DC
  Administered 2012-06-08 – 2012-06-09 (×2): 15 mg via ORAL
  Filled 2012-06-08 (×5): qty 1

## 2012-06-08 MED ORDER — ENOXAPARIN SODIUM 100 MG/ML ~~LOC~~ SOLN
1.0000 mg/kg | Freq: Two times a day (BID) | SUBCUTANEOUS | Status: DC
  Administered 2012-06-08: 95 mg via SUBCUTANEOUS
  Filled 2012-06-08 (×2): qty 1

## 2012-06-08 MED ORDER — HEPARIN (PORCINE) IN NACL 100-0.45 UNIT/ML-% IJ SOLN
1200.0000 [IU]/h | INTRAMUSCULAR | Status: DC
  Administered 2012-06-08: 1000 [IU]/h via INTRAVENOUS
  Filled 2012-06-08 (×2): qty 250

## 2012-06-08 NOTE — Progress Notes (Signed)
Dr. Butler Denmark notified via text page that pt is positive for RLE DVT with details in EPIC notes. Renette Butters, Viona Gilmore

## 2012-06-08 NOTE — Progress Notes (Signed)
ANTICOAGULATION CONSULT NOTE - Follow Up Consult  Pharmacy Consult for heparin Indication: pulmonary embolus  Labs:  Basename 06/08/12 0025 06/07/12 2149 06/07/12 1408  HGB -- -- 12.9  HCT -- -- 37.6  PLT -- -- 184  APTT -- -- --  LABPROT -- -- --  INR -- -- --  HEPARINUNFRC 0.25* -- --  CREATININE -- -- 0.69  CKTOTAL -- -- --  CKMB -- -- --  TROPONINI -- <0.30 --    Assessment: 46yo female subtherapeutic on heparin with initial dosing for new PE.  Goal of Therapy:  Heparin level 0.3-0.7 units/ml   Plan:  Will increase heparin gtt by 2 units/kg/hr to 1200 units/hr and check level in 6hr.  Colleen Can PharmD BCPS 06/08/2012,1:22 AM

## 2012-06-08 NOTE — Progress Notes (Addendum)
Right:  DVT noted in the distal popliteal, peroneal, and posterior tibial veins .  No evidence of superficial thrombosis.  No Baker's cyst.  Left:  No evidence of DVT, superficial thrombosis, or Baker's cyst.

## 2012-06-08 NOTE — Progress Notes (Signed)
  Echocardiogram 2D Echocardiogram has been performed.  Georgian Co 06/08/2012, 4:34 PM

## 2012-06-08 NOTE — Progress Notes (Signed)
   CARE MANAGEMENT NOTE 06/08/2012  Patient:  ARSHA, OSTERMEIER   Account Number:  [id number]  Date Initiated:  06/08/2012  Documentation initiated by:  [facility]  Subjective/Objective Assessment:   PE     Action/Plan:   Anticipated DC Date:     Anticipated DC Plan:  HOME/SELF CARE      DC Planning Services  CM consult  Medication Assistance      Choice offered to / List presented to:             Status of service:  In process, will continue to follow Medicare Important Message given?   (If response is "NO", the following Medicare IM given date fields will be blank) Date Medicare IM given:   Date Additional Medicare IM given:    Discharge Disposition:    Per UR Regulation:    If discussed at Long Length of Stay Meetings, dates discussed:    Comments:  06/08/2012 1300 NCM spoke to pt and states she is not scheduled for d/c today. Pt's RN did follow up with Karin Golden and her copay will be $40.00 for Xarelto. NCM will provide pt with a Xarelto card that she can get 10 days free of medication. She will need a separate Rx at time of d/c for that 10 day free supply. Explained she will have to call and have card activated on Monday. Isidoro Donning RN CCM Case Mgmt phone [phone]

## 2012-06-08 NOTE — Progress Notes (Signed)
[facility] PROGRESS NOTE  Bianca Simmons ZOX:[id number] DOB: Jan 05, 1966 DOA: 06/07/2012 PCP: Paulino Rily, MD  Assessment/Plan: Principal Problem:  *Pulmonary embolism, bilateral- heavy clot burden Pt able to afford Xarelto- will go ahead and start F/u on ECHO hypercoag w/u ordered   DVT Left leg No need for IVC filter- d/w Dr Edilia Bo   Pulmonary embolism 10 yrs ago Was on oral contraceptives   Code Status: full code Disposition Plan: follow in SDU DVT prophylaxis: Xarelto   Brief narrative: 46 yo healthy female with h/o PE over 10 years ago while on birth control was treated adequately at that time with coumadin comes in with 2 days of worsening sob. No cp. No cough. No hemoptysis. No fevers. No le edema or swelling. Not on any hormone replacement. Did recently take dr oz's diet pill which is a herbal medicine which is the only new drug she has used. No recent trrauma, surgery or traveling.    Consultants:  none  Procedures:  none  Antibiotics:  none  HPI/Subjective: No complaints- discussed Xarelto and hypercoag w/u with patient - all questions answered  Objective: Filed Vitals:   06/08/12 0735 06/08/12 0737 06/08/12 1159 06/08/12 1525  BP: 123/60  110/43 120/64  Pulse: 76  95 88  Temp:  97.4 F (36.3 C) 98.4 F (36.9 C) 98.7 F (37.1 C)  TempSrc:  Oral Oral Oral  Resp: 17  18 15   Height:      Weight:      SpO2: 98%  99% 100%    Intake/Output Summary (Last 24 hours) at 06/08/12 1944 Last data filed at 06/08/12 1800  Gross per 24 hour  Intake 983.61 ml  Output      0 ml  Net 983.61 ml    Exam:   General:  Alert, no acute distress  Cardiovascular: RRR, no murmurs  Respiratory: CTA b/l   Abdomen: soft, NT, ND, BS+  Ext:no c/c/e  Data Reviewed: Basic Metabolic Panel:  Lab 06/08/12 8119 06/07/12 1408  NA 138 137  K 3.4* 3.2*  CL 105 102  CO2 24 25  GLUCOSE 89 120*  BUN 10 13  CREATININE 0.65 0.69  CALCIUM 8.2* 9.1  MG --  --  PHOS -- --   Liver Function Tests:  Lab 06/07/12 1408  AST 18  ALT 13  ALKPHOS 101  BILITOT 0.3  PROT 8.0  ALBUMIN 3.6   No results found for this basename: LIPASE:5,AMYLASE:5 in the last 168 hours No results found for this basename: AMMONIA:5 in the last 168 hours CBC:  Lab 06/08/12 0334 06/07/12 1408  WBC 6.5 7.7  NEUTROABS -- 4.9  HGB 11.4* 12.9  HCT 33.8* 37.6  MCV 89.7 88.9  PLT 173 184   Cardiac Enzymes:  Lab 06/08/12 0917 06/08/12 0331 06/07/12 2149  CKTOTAL -- -- --  CKMB -- -- --  CKMBINDEX -- -- --  TROPONINI <0.30 <0.30 <0.30   BNP (last 3 results) No results found for this basename: PROBNP:3 in the last 8760 hours CBG:  Lab 06/07/12 2028  GLUCAP 87    Recent Results (from the past 240 hour(s))  MRSA PCR SCREENING     Status: Normal   Collection Time   06/07/12  9:12 PM      Component Value Range Status Comment   MRSA by PCR NEGATIVE  NEGATIVE Final      Studies: Dg Chest 2 View  06/07/2012  *RADIOLOGY REPORT*  Clinical Data: Shortness of breath for 3 - 4  days.  CHEST - 2 VIEW  Comparison: CT angio chest 03/10/2004  Findings: Cardiomediastinal silhouette is within normal limits. There are no focal consolidations or pleural effusions.  No pulmonary edema.  Mild mid thoracic degenerative changes are present.  IMPRESSION: No evidence for acute cardiopulmonary abnormality.   Original Report Authenticated By: Norva Pavlov, M.D.    Ct Angio Chest Pe W/cm &/or Wo Cm  06/07/2012  *RADIOLOGY REPORT*  Clinical Data: Shortness of breath with exertion.  Elevated D- dimer.  History of pulmonary embolus.  CT ANGIOGRAPHY CHEST  Technique:  Multidetector CT imaging of the chest using the standard protocol during bolus administration of intravenous contrast. Multiplanar reconstructed images including MIPs were obtained and reviewed to evaluate the vascular anatomy.  Contrast: OMNIPAQUE IOHEXOL 350 MG/ML SOLN  Comparison: Multiple exams, including 03/10/2004   Findings: Large amount of bilateral pulmonary embolus noted involving all lobes.  Most of this appears acute and clot burden is high.  No reversal of the interventricular septal contour.  There is only minimal reflux of contrast medium into the IVC.  Scattered upper mediastinal lymph nodes are in the upper normal size range.  Mild atelectasis noted posteriorly in the right upper lobe.  There is faint interstitial accentuation in the upper lobes.  Thoracic spondylosis noted.  IMPRESSION:  1.  Large bilateral pulmonary emboli involving all lobes.  Clot burden is high.  Critical Value/emergent results were called by telephone to Dr. Nelva Nay (who verbally acknowledged these results) at the time of interpretation on 06/07/2012 at 5:56 p.m.   Original Report Authenticated By: Gaylyn Rong, M.D.     Scheduled Meds:   . rivaroxaban  15 mg Oral BID WC  . sodium chloride  3 mL Intravenous Q12H  . [DISCONTINUED] enoxaparin (LOVENOX) injection  1 mg/kg Subcutaneous Q12H   Continuous Infusions:   . [DISCONTINUED] heparin 1,000 Units/hr (06/07/12 1817)  . [DISCONTINUED] heparin 1,200 Units/hr (06/08/12 0737)    ________________________________________________________________________  Time spent: 35 min    [facility]  [facility] Pager [phone] If 8PM-8AM, please contact night-coverage at [email or web address], password [email or web address] 06/08/2012, 7:44 PM  LOS: 1 day

## 2012-06-09 MED ORDER — ACETAMINOPHEN 325 MG PO TABS
325.0000 mg | ORAL_TABLET | Freq: Four times a day (QID) | ORAL | Status: DC | PRN
  Administered 2012-06-09: 325 mg via ORAL
  Filled 2012-06-09: qty 1

## 2012-06-09 MED ORDER — RIVAROXABAN 20 MG PO TABS: 20.0000 mg | ORAL_TABLET | Freq: Every day | ORAL | Status: DC

## 2012-06-09 MED ORDER — RIVAROXABAN 15 MG PO TABS: 15.0000 mg | ORAL_TABLET | Freq: Two times a day (BID) | ORAL | Status: DC

## 2012-06-09 NOTE — Discharge Summary (Signed)
Physician Discharge Summary  Bianca Simmons ZOX:[id number] DOB: 26-Sep-1965 DOA: 06/07/2012  PCP: Paulino Rily, MD  Admit date: 06/07/2012 Discharge date: 06/09/2012  Time spent: 45 minutes  Recommendations for Outpatient Follow-up:  1. Follow up with Hematology- hypercoagulable work up was ordered - heparin level was 26  Discharge Diagnoses:  Principal Problem:  *Pulmonary embolism, bilateral Active Problems:  SOB (shortness of breath)  Pulmonary embolism 10 yrs ago  DVT (deep venous thrombosis)   Discharge Condition: stable  Diet recommendation: low sodium, heart healty  Filed Weights   06/07/12 2055  Weight: 93.6 kg (206 lb 5.6 oz)    History of present illness:  46 yo healthy female with h/o PE over 10 years ago while on birth control was treated adequately at that time with coumadin comes in with 2 days of worsening sob. No cp. No cough. No hemoptysis. No fevers. No le edema or swelling. Not on any hormone replacement. Did recently take dr oz's diet pill which is a herbal medicine which is the only new drug she has used. No recent trrauma, surgery or traveling.    Hospital Course:  Principal Problem:  *Pulmonary embolism, bilateral- heavy clot burden  Pt able to afford Xarelto- have gone ahead and initiated it Education given about bleeding risk F/u on ECHO  hypercoag w/u ordered  She has been on room air with O2 sat of 98-100%- able to ambulate without hypoxia or respiratory distress  DVT Left leg  No need for IVC filter unless she develops a hemorrhage with anticoagulation - d/w Dr Edilia Bo   Pulmonary embolism 10 yrs ago  Secondary to oral contraceptives  Discharge Exam: Filed Vitals:   06/08/12 2300 06/09/12 0444 06/09/12 0739 06/09/12 0740  BP: 120/38 112/57  111/51  Pulse: 71 77  89  Temp: 98.6 F (37 C) 98.4 F (36.9 C) 98.4 F (36.9 C)   TempSrc: Oral Oral Oral   Resp: 18 16  18   Height:      Weight:      SpO2: 97% 98%  99%    General: alert,  no acute distress Cardiovascular: RRR, no murmurs Respiratory: CTA b/l   Discharge Instructions  Discharge Orders    Future Orders Please Complete By Expires   Diet - low sodium heart healthy      Increase activity slowly      Discharge instructions      Comments:   Return to ER for any uncontrolled bleeding- monitor stools for blood Nasal saline gel (AYR) and humidifier to prevent nose bleeds.       Medication List     As of 06/09/2012 11:03 AM    TAKE these medications         Rivaroxaban 15 MG Tabs tablet   Commonly known as: XARELTO   Take 1 tablet (15 mg total) by mouth 2 (two) times daily with a meal.      Rivaroxaban 20 MG Tabs   Commonly known as: XARELTO   Take 1 tablet (20 mg total) by mouth daily.           Follow-up Information    Follow up with  [facility].   Contact information:   [phone]          The results of significant diagnostics from this hospitalization (including imaging, microbiology, ancillary and laboratory) are listed below for reference.    Significant Diagnostic Studies: Dg Chest 2 View  06/07/2012  *RADIOLOGY REPORT*  Clinical Data: Shortness of breath for  3 - 4 days.  CHEST - 2 VIEW  Comparison: CT angio chest 03/10/2004  Findings: Cardiomediastinal silhouette is within normal limits. There are no focal consolidations or pleural effusions.  No pulmonary edema.  Mild mid thoracic degenerative changes are present.  IMPRESSION: No evidence for acute cardiopulmonary abnormality.   Original Report Authenticated By: Norva Pavlov, M.D.    Ct Angio Chest Pe W/cm &/or Wo Cm  06/07/2012  *RADIOLOGY REPORT*  Clinical Data: Shortness of breath with exertion.  Elevated D- dimer.  History of pulmonary embolus.  CT ANGIOGRAPHY CHEST  Technique:  Multidetector CT imaging of the chest using the standard protocol during bolus administration of intravenous contrast. Multiplanar reconstructed images including MIPs were obtained and  reviewed to evaluate the vascular anatomy.  Contrast: OMNIPAQUE IOHEXOL 350 MG/ML SOLN  Comparison: Multiple exams, including 03/10/2004  Findings: Large amount of bilateral pulmonary embolus noted involving all lobes.  Most of this appears acute and clot burden is high.  No reversal of the interventricular septal contour.  There is only minimal reflux of contrast medium into the IVC.  Scattered upper mediastinal lymph nodes are in the upper normal size range.  Mild atelectasis noted posteriorly in the right upper lobe.  There is faint interstitial accentuation in the upper lobes.  Thoracic spondylosis noted.  IMPRESSION:  1.  Large bilateral pulmonary emboli involving all lobes.  Clot burden is high.  Critical Value/emergent results were called by telephone to Dr. Nelva Nay (who verbally acknowledged these results) at the time of interpretation on 06/07/2012 at 5:56 p.m.   Original Report Authenticated By: Gaylyn Rong, M.D.     Microbiology: Recent Results (from the past 240 hour(s))  MRSA PCR SCREENING     Status: Normal   Collection Time   06/07/12  9:12 PM      Component Value Range Status Comment   MRSA by PCR NEGATIVE  NEGATIVE Final      Labs: Basic Metabolic Panel:  Lab 06/08/12 1610 06/07/12 1408  NA 138 137  K 3.4* 3.2*  CL 105 102  CO2 24 25  GLUCOSE 89 120*  BUN 10 13  CREATININE 0.65 0.69  CALCIUM 8.2* 9.1  MG -- --  PHOS -- --   Liver Function Tests:  Lab 06/07/12 1408  AST 18  ALT 13  ALKPHOS 101  BILITOT 0.3  PROT 8.0  ALBUMIN 3.6   No results found for this basename: LIPASE:5,AMYLASE:5 in the last 168 hours No results found for this basename: AMMONIA:5 in the last 168 hours CBC:  Lab 06/08/12 0334 06/07/12 1408  WBC 6.5 7.7  NEUTROABS -- 4.9  HGB 11.4* 12.9  HCT 33.8* 37.6  MCV 89.7 88.9  PLT 173 184   Cardiac Enzymes:  Lab 06/08/12 0917 06/08/12 0331 06/07/12 2149  CKTOTAL -- -- --  CKMB -- -- --  CKMBINDEX -- -- --  TROPONINI  <0.30 <0.30 <0.30   BNP: BNP (last 3 results) No results found for this basename: PROBNP:3 in the last 8760 hours CBG:  Lab 06/07/12 2028  GLUCAP 87     Signed:  Yasmin Dibello  [facility] 06/09/2012, 11:03 AM

## 2012-06-09 NOTE — Progress Notes (Signed)
Discharge instructions given to pt -- verbalized and taught-back follow-up appts, activity level, home meds, s/s of complications (especially bleeding), when to call MD/EMS or come to the hospital. Bianca Simmons

## 2012-06-10 ENCOUNTER — Telehealth: Payer: Self-pay | Admitting: Oncology

## 2012-06-10 LAB — LUPUS ANTICOAGULANT PANEL
DRVVT: 35 secs (ref <42.9)
Lupus Anticoagulant: NOT DETECTED
PTT Lupus Anticoagulant: 31.9 secs (ref 28.0–43.0)

## 2012-06-10 LAB — PROTEIN C ACTIVITY: Protein C Activity: 121 % (ref 75–133)

## 2012-06-10 LAB — PROTEIN S ACTIVITY: Protein S Activity: 77 % (ref 69–129)

## 2012-06-10 NOTE — Telephone Encounter (Signed)
Pt called in re NP appt 11/12 @ 2:30 w/Dr. Arline Asp  Referring-ER Dx-PE Welcome packet emailed.

## 2012-06-10 NOTE — Telephone Encounter (Signed)
C/D on 11/11 for appt 11/12

## 2012-06-11 ENCOUNTER — Ambulatory Visit: Payer: BC Managed Care – PPO

## 2012-06-11 ENCOUNTER — Ambulatory Visit (HOSPITAL_BASED_OUTPATIENT_CLINIC_OR_DEPARTMENT_OTHER): Payer: BC Managed Care – PPO | Admitting: Oncology

## 2012-06-11 ENCOUNTER — Encounter: Payer: Self-pay | Admitting: Oncology

## 2012-06-11 ENCOUNTER — Other Ambulatory Visit: Payer: Self-pay | Admitting: Medical Oncology

## 2012-06-11 ENCOUNTER — Other Ambulatory Visit: Payer: BC Managed Care – PPO | Admitting: Lab

## 2012-06-11 VITALS — BP 122/60 | HR 95 | Temp 97.9°F | Resp 20 | Ht 68.0 in | Wt 208.4 lb

## 2012-06-11 DIAGNOSIS — I82409 Acute embolism and thrombosis of unspecified deep veins of unspecified lower extremity: Secondary | ICD-10-CM

## 2012-06-11 DIAGNOSIS — I2699 Other pulmonary embolism without acute cor pulmonale: Secondary | ICD-10-CM

## 2012-06-11 DIAGNOSIS — Z7901 Long term (current) use of anticoagulants: Secondary | ICD-10-CM

## 2012-06-11 DIAGNOSIS — I824Y9 Acute embolism and thrombosis of unspecified deep veins of unspecified proximal lower extremity: Secondary | ICD-10-CM

## 2012-06-11 LAB — CARDIOLIPIN ANTIBODIES, IGG, IGM, IGA
Anticardiolipin IgA: 3 APL U/mL — ABNORMAL LOW (ref <22)
Anticardiolipin IgG: 6 GPL U/mL — ABNORMAL LOW (ref <23)
Anticardiolipin IgM: 2 MPL U/mL — ABNORMAL LOW (ref <11)

## 2012-06-11 LAB — BETA-2-GLYCOPROTEIN I ABS, IGG/M/A
Beta-2 Glyco I IgG: 0 G Units (ref <20)
Beta-2-Glycoprotein I IgA: 7 A Units (ref <20)
Beta-2-Glycoprotein I IgM: 5 M Units (ref <20)

## 2012-06-11 LAB — FACTOR 5 LEIDEN

## 2012-06-11 NOTE — Progress Notes (Signed)
Checked in new patient. No financial issues. °

## 2012-06-11 NOTE — Progress Notes (Signed)
This office note has been dictated.  #[id number]

## 2012-06-12 ENCOUNTER — Encounter: Payer: Self-pay | Admitting: Medical Oncology

## 2012-06-12 NOTE — Progress Notes (Signed)
CC:   [name], MD [name], M.D.  PROBLEM LIST: 1. Pulmonary embolism, acute bilateral with high clot burden in     association with an asymptomatic DVT of the right lower leg     diagnosed on 06/07/2012.  Currently being treated with Xarelto     (rivaroxaban). 2. DVT involving the right lower extremity, asymptomatic at     presentation with clots seen in the right distal popliteal,     peroneal, and posterior tibial veins on Doppler study from     06/08/2012. 3. History of pulmonary embolism in association with birth control     pills in 2001. 4. History of tubal ligation in December 2012. 5. History of left plantar fasciitis in December 2012.  MEDICATIONS: 1. Xarelto 15 mg twice daily for 21 days, to be converted to 20 mg     daily, to be taken with a meal. 2. B complex vitamins.  SMOKING HISTORY:  Patient has never smoked cigarettes.  HISTORY:  Bianca Simmons is a 46 year old married African American woman who is referred for  evaluation and followup by Dr. Butler Denmark following recent hospitalization for acute bilateral pulmonary embolism diagnosed 06/07/2012.  This patient has been in generally excellent health and had noted progressive dyspnea on exertion which had started 2 days prior to admission, specifically Wednesday November 6th.  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  If the patient walked a significant distance and try to climb a flight of stairs, she would have to sit down and required about 20-30 minutes for recovery.  She was having some pressure type discomfort in her anterior chest but denied any coughing, hemoptysis, pleuritic chest pain, syncope or presyncope.  In addition, she had no swelling or pain or other discomfort in her legs, even ultimately she turned out to have a DVT involving her right lower extremity on Doppler.  The patient had  no obvious precipitating factors in the way of prolonged immobility, surgery, hormones or prolonged travel.  Of note is her past history of pulmonary embolism that occurred in 2001 when she was living in [place].  She had been on birth control pills for approximately 20 years at that time when she developed a pulmonary embolism.  She is not aware that she had any blood clots in her legs.  The patient had been on a preparation called Garcinia [place] that she bought on-line after seeing it advertised on Dr. Shaune Pascal show.  She had taken this for 5 days but had not taken any for the month preceding her admission to the hospital.  This admission was from November 8th through November 10th.  The patient came to the emergency room, had a chest x-ray that was negative.  A chest CT scan showed large high burden bilateral blood clots felt to be acute involving the  lungs.  The patient was admitted and received appropriate anticoagulation treatment and ultimately was discharged on Xarelto 15 mg twice daily.  It was noted that the patient is able to afford Xarelto through her insurance.  It was noted that she had O2 saturations on room air of 98% to 100% and was able to ambulate around the ward without hypoxia or respiratory distress.  A hypercoagulable panel was ordered.  In addition, the patient had a Doppler study of her legs which showed that she had asymptomatic DVT. No clot was seen above  the popliteal vein.  The patient is here today with her husband, Mardelle Matte.  She is markedly improved by approximately 50% to 60%, but says that she still feels washed out with some dyspnea on exertion.  She has some intermittent discomfort in her anterior chest worse with lying down.  She does not feel strong enough to return to work just yet.  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  Some of the results from her hypercoagulable panel have returned and they are basically normal.  We are  awaiting the rest of the results.  PAST MEDICAL HISTORY:  Notable for the previous episode of pulmonary embolism that occurred when she was living in [place] in 2001.  She had been on birth control pills for 20 years.  She was on Coumadin for a while then became pregnant.  She was given Lovenox during her pregnancy and for several weeks thereafter.  Ultimately, anticoagulation was discontinued and she had no further problems with blood clots until now. Other aspects of her past medical history include tubal ligation in December 2012, and the diagnosis of a left plantar fasciitis also about a year ago.  This is an ongoing problem for her.  She had a fracture of her right arm at age 67.  No history of blood transfusions.  She may have had an infection around her right knee many years ago.  Sulfa causes nausea and vomiting but no rash.  FAMILY HISTORY:  Father may have had a blood clot in his foot, was on Coumadin.  He is 47 years old, has diabetes mellitus.  Mother is in her mid 31s has diabetes mellitus.  Sister age 62, had a stroke at age 82, is not on anticoagulants.  The patient has 3 sons ages 81, 20, and 68, currently in good health.  There is positive family history for diabetes but no known history of blood clots in other members of the family.  SOCIAL HISTORY:  The patient denies any history of cigarette smoking. She drinks occasionally on social occasions.  No history of IV drugs. She was born in [place], [place], has lived in [place] for about the past 10 years.  She has been married for 13 years.  She and her husband and 33 year old child live in [place] in a 2 level home.  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  She drives, is generally active and has been in excellent health.  REVIEW OF SYSTEMS:  The patient recently had some vertigo went to the emergency room and was given some medicine which she has not taken.  She has not had a recurrence.   No tinnitus or hearing loss.  She has sensation of either numbness or weakness in her extremities.  Hearing and vision are good.  She denies any sinus problems.  Her shortness of breath is markedly improved.  She still feels weak, is not ready go back to work.  No cough or hemoptysis or pleuritic chest pain.  No cardiac symptomatology.  Chest discomfort described as pressure is intermittent, worse with lying down.  No GI symptoms.  Last mammogram was 2 years ago. No breast symptomatology.  The patient was urged to have a mammogram. No urinary symptoms.  The patient has light periods lasting 1 or 2 days. These occur regularly every 4 weeks.  Last menstrual period was 2 weeks ago.  No swelling of the legs, previous history of blood clots.  She has had plantar  fasciitis in her left foot for the past year.  No history of bleeding or bruising.  No history of arthritis.  She has cramps in her fingers at times.  No fever, chills, or night sweats.  No rashes.  No history of depression.  PHYSICAL EXAMINATION:  The patient is a very pleasant, young woman, somewhat overweight, weighing 208.3 pounds, height 5 feet 8 inches, body surface area 2.13 sq m.  Blood pressure 122/60.  Pulse 95 and regular. Respirations 20 and unlabored.  Temperature 97.9.  O2 saturation on room air at rest was 98%.  There is no scleral icterus.  The patient's pupils have been dilated after going to an ophthalmologist.  They are equal. Extraocular movements are normal.  Mouth and pharynx benign.  She has small torus palatinus.  Teeth are in good condition.  Neck is without adenopathy or bruit.  The thyroid may be slightly enlarged, symmetric without nodules.  Heart and lungs are normal.  Breasts are not examined. No axillary or inguinal adenopathy.  Back:  No skeletal tenderness. Abdomen:  Soft, nontender, with no organomegaly or masses palpable. Extremities:  No peripheral edema, clubbing, palmar erythema, petechiae, or  purpura.  Denna Haggard' sign was negative.  No calf tenderness.  Neurologic exam was normal.  LABORATORY DATA:  None was carried out today.  CBC from 06/07/2012, white count was 7.7, ANC 4.9, hemoglobin 12.9, hematocrit 37.6, platelets a 184,000.  Chemistries from 06/07/2012, notable for a potassium of 3.2.  Glucose 120, otherwise normal.  D-dimer on 06/07/2012, was 11.90.  Lupus anticoagulant on 06/08/2012, was not detected.  Anticardiolipin antibodies were normal as were beta 2 glycoprotein 1 antibodies.  Antithrombin 3 was 80% which is normal. Protein S activity was normal at 77%, protein C activity 121%. Homocystine level was 8.2, which is normal.  Currently pending, the Leiden factor V, and prothrombin 2 gene mutation analysis.  IMAGING STUDIES: 1. CT angiogram of the chest with IV contrast from 03/10/2004, showed     no evidence for pulmonary embolism.  There was mild central     peribronchial thickening with mildly enlarged bilateral hilar lymph     nodes, felt to be most likely reactive.  Screening mammogram     bilateral on 01/31/2011, showed possible mass right breast with     additional evaluation indicated. 2. Digital diagnostic right limited mammogram showed no persistent     worrisome abnormality upon additional imaging of the right breast. 3. MRI of the head without IV contrast on 02/24/2012 was normal. 4. Chest x-ray, 2 view, on 06/07/2012, showed no evidence for acute     cardiopulmonary abnormality. 5. CT angiogram of the chest on 06/07/2012, showed large amount of     bilateral pulmonary embolus noted involving all lobes.  Most of     this appears acute and clot burden is high.  There was no reversal     of the inter ventriculoseptal contour.  There was only minimal     reflux of contrast medium into the IVC.  Scattered upper     mediastinal lymph nodes were in the upper normal size range.  There     was mild atelectasis noted posteriorly in the right upper lobe.      There was faint interstitial accentuation in the upper lobes.     Thoracic spondylosis was noted. 6. Bilateral lower extremity venous duplex evaluation on 06/08/2012,     showed findings consistent with acute deep vein thrombosis     involving the  right distal popliteal, peroneal and posterior tibial     veins.  There was no evidence of deep vein thrombosis involving the     left lower extremity.  No evidence of a Baker's cyst on the right     or left.  IMPRESSION AND PLAN:  This 46 year old woman with no other significant medical problems has now suffered her 2nd episode of pulmonary embolism over the past 13 years.  In the first occurrence  back in 2001, the patient had been on birth control pills for about 20 years.  We do not have any of the details since this was in [place], [place].  By history she did not have blood clots in her legs.  She is not aware that a hypercoagulable panel was carried out.  She has not been on anticoagulation therapy for over 10 years.  The current episode had no known provocation, seemed to start with asymptomatic blood clots in the right lower leg with no apparent blood clots above the knee and a very high embolic burden to the pulmonary vasculature.  Currently, we are awaiting a few tests on the hypercoagulable panel.  The patient is currently on Xarelto 15 mg twice daily, which she will take for approximately 3 weeks and then convert to 20 mg daily.  I have emphasized to the patient the importance of her continuing on anticoagulation therapy indefinitely.  I explained to her the seriousness of the situation in detail and the high bilateral clot burden that she had on the CT scans.  I think the patient and her husband understand the situation quite well.  She will need lifelong anticoagulation regardless of the results of the hypercoagulable panel.  The patient seems quite responsible and understanding of the situation.  My plan is to see her again  in late January of 2014.  One week prior to that visit,  I would like to repeat her CT scan of the chest with IV contrast.  Would also plan to obtain Doppler study on the right leg.    ______________________________ Samul Dada, M.D. DSM/MEDQ  D:  06/11/2012  T:  06/12/2012  Job:  [id number]

## 2012-06-12 NOTE — Progress Notes (Signed)
I faxed a note from Dr. Arline Asp to excuse pt from work until Dec. 2, 2013. I faxed to Wendee Beavers Fax [phone]

## 2012-06-14 LAB — PROTEIN S, TOTAL: Protein S Ag, Total: 85 % (ref 60–150)

## 2012-06-14 LAB — PROTEIN C, TOTAL: Protein C, Total: 69 % — ABNORMAL LOW (ref 72–160)

## 2012-06-17 ENCOUNTER — Telehealth: Payer: Self-pay | Admitting: *Deleted

## 2012-06-17 ENCOUNTER — Encounter: Payer: Self-pay | Admitting: Oncology

## 2012-06-17 ENCOUNTER — Other Ambulatory Visit: Payer: Self-pay | Admitting: Oncology

## 2012-06-17 DIAGNOSIS — I2699 Other pulmonary embolism without acute cor pulmonale: Secondary | ICD-10-CM

## 2012-06-17 NOTE — Telephone Encounter (Signed)
Patient confirmed over the phone the new date and time on 08-30-2012 at 9:00am

## 2012-06-17 NOTE — Progress Notes (Signed)
The hypercoagulable panel so far is negative but did not include the prothrombin II gene mutation. We will order this test at the time of the patient's next appointment with me, scheduled for 08/29/2012.

## 2012-06-28 ENCOUNTER — Telehealth: Payer: Self-pay | Admitting: Medical Oncology

## 2012-06-28 NOTE — Telephone Encounter (Signed)
Pt called and states that when she saw Dr. Arline Asp 06/11/12 he wrote her a note to return to work on Dec. 02. She has not heard from her labs and did not know if it is still ok to return to work. Per Dr. Mamie Levers note all the hypercoagulation  Results were negative. I asked her if she feels ok to return and she states yes. From Dr. Mamie Levers standpoint she can return. I asked her to call if any problems. She voiced understanding.

## 2012-07-26 ENCOUNTER — Other Ambulatory Visit: Payer: Self-pay | Admitting: *Deleted

## 2012-07-26 NOTE — Telephone Encounter (Signed)
PT. WAS GIVEN A PRESCRIPTION FOR XARELTO IN THE HOSPITAL. SHE CALLED LATE Friday AFTERNOON TO SEE IF DR.MURINSON WOULD REFILL THIS PRESCRIPTION. SHE HAS ENOUGH MEDICATION THROUGH Monday. INSTRUCTED PT. TO CALL Monday MORNING AND SPEAK OR LEAVE A VOICE MAIL FOR DR.MURINSON'S NURSE CONCERNING A XARELTO PRESCRIPTION. SHE VOICES UNDERSTANDING. THIS NOTE TO DR.MURINSON'S NURSE'S DESK.

## 2012-07-29 ENCOUNTER — Other Ambulatory Visit: Payer: Self-pay

## 2012-07-29 DIAGNOSIS — I2699 Other pulmonary embolism without acute cor pulmonale: Secondary | ICD-10-CM

## 2012-07-29 MED ORDER — RIVAROXABAN 20 MG PO TABS: 20.0000 mg | ORAL_TABLET | Freq: Every day | ORAL | Status: DC

## 2012-07-29 NOTE — Telephone Encounter (Signed)
lvm on mobile that Rx was refilled at [facility] in [place]

## 2012-08-20 ENCOUNTER — Ambulatory Visit (HOSPITAL_COMMUNITY)
Admission: RE | Admit: 2012-08-20 | Discharge: 2012-08-20 | Disposition: A | Discharge status: Home / Self Care | Payer: BC Managed Care – PPO | Source: Ambulatory Visit | Attending: Oncology | Admitting: Oncology

## 2012-08-20 ENCOUNTER — Telehealth: Payer: Self-pay | Admitting: Medical Oncology

## 2012-08-20 DIAGNOSIS — Z09 Encounter for follow-up examination after completed treatment for conditions other than malignant neoplasm: Secondary | ICD-10-CM | POA: Insufficient documentation

## 2012-08-20 DIAGNOSIS — I82409 Acute embolism and thrombosis of unspecified deep veins of unspecified lower extremity: Secondary | ICD-10-CM

## 2012-08-20 DIAGNOSIS — Z86711 Personal history of pulmonary embolism: Secondary | ICD-10-CM | POA: Insufficient documentation

## 2012-08-20 DIAGNOSIS — Z7901 Long term (current) use of anticoagulants: Secondary | ICD-10-CM | POA: Insufficient documentation

## 2012-08-20 DIAGNOSIS — Z86718 Personal history of other venous thrombosis and embolism: Secondary | ICD-10-CM | POA: Insufficient documentation

## 2012-08-20 NOTE — Progress Notes (Signed)
VASCULAR LAB PRELIMINARY  PRELIMINARY  PRELIMINARY  PRELIMINARY  Right lower extremity venous duplex completed.    Preliminary report:  Right:  No evidence of DVT, superficial thrombosis, or Baker's cyst. Previous DVT of 06/06/11 appears to have resolved  Truth Barot, RVS 08/20/2012, 10:41 AM

## 2012-08-20 NOTE — Telephone Encounter (Signed)
Called from [facility] lab that her doppler study this am is negative. Dr. Arline Asp notified.

## 2012-08-23 ENCOUNTER — Ambulatory Visit (HOSPITAL_COMMUNITY)
Admission: RE | Admit: 2012-08-23 | Discharge: 2012-08-23 | Disposition: A | Discharge status: Home / Self Care | Payer: BC Managed Care – PPO | Source: Ambulatory Visit | Attending: Oncology | Admitting: Oncology

## 2012-08-23 DIAGNOSIS — Z86711 Personal history of pulmonary embolism: Secondary | ICD-10-CM | POA: Insufficient documentation

## 2012-08-23 DIAGNOSIS — I2699 Other pulmonary embolism without acute cor pulmonale: Secondary | ICD-10-CM

## 2012-08-23 MED ORDER — IOHEXOL 300 MG/ML  SOLN
100.0000 mL | Freq: Once | INTRAMUSCULAR | Status: AC | PRN
  Administered 2012-08-23: 100 mL via INTRAVENOUS

## 2012-08-27 ENCOUNTER — Encounter: Payer: Self-pay | Admitting: Medical Oncology

## 2012-08-27 NOTE — Progress Notes (Signed)
Received a note from radiology that pt has rescheduled her CT from 08/23/12 to 08/29/2012.

## 2012-08-28 ENCOUNTER — Other Ambulatory Visit: Payer: Self-pay

## 2012-08-28 ENCOUNTER — Telehealth: Payer: Self-pay | Admitting: Oncology

## 2012-08-28 NOTE — Telephone Encounter (Signed)
S/w the pt and she is aware of her lab appt on 08/29/2012@8 :30am.

## 2012-08-29 ENCOUNTER — Other Ambulatory Visit (HOSPITAL_BASED_OUTPATIENT_CLINIC_OR_DEPARTMENT_OTHER): Payer: BC Managed Care – PPO | Admitting: Lab

## 2012-08-29 ENCOUNTER — Encounter: Payer: Self-pay | Admitting: Oncology

## 2012-08-29 ENCOUNTER — Telehealth: Payer: Self-pay | Admitting: Oncology

## 2012-08-29 ENCOUNTER — Ambulatory Visit (HOSPITAL_BASED_OUTPATIENT_CLINIC_OR_DEPARTMENT_OTHER): Payer: BC Managed Care – PPO | Admitting: Oncology

## 2012-08-29 ENCOUNTER — Telehealth: Payer: Self-pay

## 2012-08-29 VITALS — BP 102/62 | HR 70 | Temp 97.3°F | Resp 18 | Ht 68.0 in | Wt 212.0 lb

## 2012-08-29 DIAGNOSIS — I2699 Other pulmonary embolism without acute cor pulmonale: Secondary | ICD-10-CM

## 2012-08-29 DIAGNOSIS — R635 Abnormal weight gain: Secondary | ICD-10-CM

## 2012-08-29 DIAGNOSIS — E8809 Other disorders of plasma-protein metabolism, not elsewhere classified: Secondary | ICD-10-CM

## 2012-08-29 LAB — T4, FREE: Free T4: 0.86 ng/dL (ref 0.80–1.80)

## 2012-08-29 LAB — CBC WITH DIFFERENTIAL/PLATELET
BASO%: 0.8 % (ref 0.0–2.0)
Basophils Absolute: 0 10*3/uL (ref 0.0–0.1)
EOS%: 1.7 % (ref 0.0–7.0)
Eosinophils Absolute: 0.1 10*3/uL (ref 0.0–0.5)
HCT: 36.8 % (ref 34.8–46.6)
HGB: 12.6 g/dL (ref 11.6–15.9)
LYMPH%: 27.4 % (ref 14.0–49.7)
MCH: 30.5 pg (ref 25.1–34.0)
MCHC: 34.2 g/dL (ref 31.5–36.0)
MCV: 89.2 fL (ref 79.5–101.0)
MONO#: 0.4 10*3/uL (ref 0.1–0.9)
MONO%: 6.9 % (ref 0.0–14.0)
NEUT#: 3.5 10*3/uL (ref 1.5–6.5)
NEUT%: 63.2 % (ref 38.4–76.8)
Platelets: 216 10*3/uL (ref 145–400)
RBC: 4.12 10*6/uL (ref 3.70–5.45)
RDW: 14 % (ref 11.2–14.5)
WBC: 5.6 10*3/uL (ref 3.9–10.3)
lymph#: 1.5 10*3/uL (ref 0.9–3.3)

## 2012-08-29 LAB — COMPREHENSIVE METABOLIC PANEL (CC13)
ALT: 10 U/L (ref 0–55)
AST: 14 U/L (ref 5–34)
Albumin: 3 g/dL — ABNORMAL LOW (ref 3.5–5.0)
Alkaline Phosphatase: 105 U/L (ref 40–150)
BUN: 13.8 mg/dL (ref 7.0–26.0)
CO2: 25 mEq/L (ref 22–29)
Calcium: 8.8 mg/dL (ref 8.4–10.4)
Chloride: 107 mEq/L (ref 98–107)
Creatinine: 0.8 mg/dL (ref 0.6–1.1)
Glucose: 97 mg/dl (ref 70–99)
Potassium: 3.8 mEq/L (ref 3.5–5.1)
Sodium: 139 mEq/L (ref 136–145)
Total Bilirubin: 0.36 mg/dL (ref 0.20–1.20)
Total Protein: 7.3 g/dL (ref 6.4–8.3)

## 2012-08-29 LAB — LACTATE DEHYDROGENASE (CC13): LDH: 184 U/L (ref 125–245)

## 2012-08-29 LAB — TSH: TSH: 1.379 u[IU]/mL (ref 0.350–4.500)

## 2012-08-29 LAB — T4: T4, Total: 8.8 ug/dL (ref 5.0–12.5)

## 2012-08-29 NOTE — Telephone Encounter (Signed)
gv and printd appt schedule for pt for July

## 2012-08-29 NOTE — Progress Notes (Signed)
CC:   [name], MD [name], M.D.   PROBLEM LIST:  1. Pulmonary embolism, acute bilateral with high clot burden in  association with an asymptomatic DVT of the right lower leg  diagnosed on 06/07/2012. Currently being treated with Xarelto  (rivaroxaban). Doppler study of the right leg carried out on 08/20/2012 and CT angiogram of the chest carried out on 08/23/2012 showed resolution of previous blood clots. Hypercoagulable workup carried out on 06/08/2012 was negative but did not include the prothrombin II gene mutation which we are checking today, so that is pending. 2. DVT involving the right lower extremity, asymptomatic at  presentation with clots seen in the right distal popliteal,  peroneal, and posterior tibial veins on Doppler study from  06/08/2012.  3. History of pulmonary embolism in association with birth control  pills in 2001.  4. History of tubal ligation in December 2012.  5. History of left plantar fasciitis in December 2012. 6. Shortness of breath noted since November 2013. 7. Weight gain, 783.1. 8. Hypoalbuminemia noted 08/29/2012.  MEDICATIONS:   The patient's only medicine is Xarelto (rivaroxaban) 20 mg daily.  SMOKING HISTORY:  The patient has never smoked cigarettes.  HISTORY:  I am seeing Bianca Simmons today for followup of her hypercoagulable state, characterized by  bilateral pulmonary emboli with high clot burden, in association with an asymptomatic DVT of the right lower leg, diagnosed in early November 2013.  The patient was first seen by me on 06/12/2012.  At that time she was on Xarelto 15 mg twice a day. She is now on 20 mg daily and taking her medicines faithfully.  The importance of this has been emphasized to her repeatedly.  The patient's only complaints are some dyspnea on exertion, especially when walking up stairs.  This was first noted a couple months ago, with the diagnosis of her pulmonary emboli.  Symptoms are relatively  mild. The patient also was having night sweats, which she attributes to menopause.  We have emphasized the importance of staying away from hormonal agents.  I have encouraged the patient to consult with her gynecologist regarding management of her hot flashes which may be postmenopausal in nature.  PHYSICAL EXAMINATION:  The patient looks well.  Weight is 212 pounds as compared with 208 pounds on 06/11/2012.  Height 5 feet 8 inches.  Body surface area 2.15 sq m.  Blood pressure 102/62.  Other vital signs are normal.  O2 saturation on room air at rest was 100%.  There was no scleral icterus.  Mouth and pharynx are benign.  No peripheral adenopathy palpable.  Heart and lungs were normal.  Pulse was 70 and regular.  Breasts were not examined.  No axillary adenopathy.  Abdomen is benign.  Extremities:  No peripheral edema or calf tenderness.  LABORATORY DATA:  Today, white count 5.6, ANC 3.5, hemoglobin 12.6, hematocrit 36.8, platelets 216,000.  Chemistries were normal except for an albumin of 3.0.  Albumin on 06/07/2012 was 3.6, and on 02/24/2012, 3.8.  LDH today was 184.  Hypercoagulable workup carried out on 06/08/2012 was negative but did not include the prothrombin II gene mutation which we are checking today, so that is pending.  IMAGING STUDIES:  1. CT angiogram of the chest with IV contrast from 03/10/2004, showed  no evidence for pulmonary embolism. There was mild central  peribronchial thickening with mildly enlarged bilateral hilar lymph  nodes, felt to be most likely reactive. Screening mammogram  bilateral on 01/31/2011, showed possible mass right breast  with  additional evaluation indicated.  2. Digital diagnostic right limited mammogram showed no persistent  worrisome abnormality upon additional imaging of the right breast.  3. MRI of the head without IV contrast on 02/24/2012 was normal.  4. Chest x-ray, 2 view, on 06/07/2012, showed no evidence for acute   cardiopulmonary abnormality.  5. CT angiogram of the chest on 06/07/2012, showed large amount of  bilateral pulmonary embolus noted involving all lobes. Most of  this appears acute and clot burden is high. There was no reversal  of the inter ventriculoseptal contour. There was only minimal  reflux of contrast medium into the IVC. Scattered upper  mediastinal lymph nodes were in the upper normal size range. There  was mild atelectasis noted posteriorly in the right upper lobe.  There was faint interstitial accentuation in the upper lobes.  Thoracic spondylosis was noted.  6. Bilateral lower extremity venous duplex evaluation on 06/08/2012,  showed findings consistent with acute deep vein thrombosis  involving the right distal popliteal, peroneal and posterior tibial  veins. There was no evidence of deep vein thrombosis involving the  left lower extremity. No evidence of a Baker's cyst on the right  or left. 7. Lower extremity venous duplex of the right lower extremity carried out on 08/20/2012 showed no evidence of deep vein or superficial thrombosis involving the right lower extremity.  DVT noted on 06/05/2012 appears to have resolved. 8. CT scan of the chest with IV contrast carried out on 08/23/2012 showed no acute cardiopulmonary disease.  There was interval recanalization of the central pulmonary arteries bilaterally since the prior CT angiogram carried out on June 07, 2012.  There was no evidence of right heart strain.  No pericardial effusion or visible coronary atherosclerosis. There was minimal atherosclerosis involving the aortic arch.  Lungs were clear.  IMPRESSION AND PLAN:  The patient is doing well at this time with the exception of some residual shortness of breath.  I offered the patient a pulmonary consultation to carryout pulmonary function tests, possibly a 2D echocardiogram and other evaluations to assess her shortness of breath.  The patient would prefer to wait.   She is having some night sweats.  She has had some weight gain.  We will check thyroid function tests at her request.  The patient has been cautioned about the use of hormonal agents.  She was encouraged to obtain yearly mammograms and to get a colonoscopy when she turns age 63.  She understands that she needs to stay on the Xarelto indefinitely, unless this is changed to another anticoagulant.  She understands to call us for any bleeding, bruising, leg swelling, pain or chest symptomatology, especially worsening of shortness of breath.  We will plan to see the patient again in 6 months, at which time we will check CBC and chemistries.  Currently the prothrombin II gene mutation is pending.    ______________________________ Samul Dada, M.D. DSM/MEDQ  D:  08/29/2012  T:  08/29/2012  Job:  [id number]

## 2012-08-29 NOTE — Patient Instructions (Signed)
Avoid hormones medicines.  Stay on anticoagulation therapy indefinitely.  Call us if you want [place] to arrange an appointment with a lung specialist.  See you in 6 months

## 2012-08-29 NOTE — Telephone Encounter (Signed)
lvm that thyroid function tests were normal per Dr Arline Asp

## 2012-08-29 NOTE — Progress Notes (Signed)
This office note has been dictated.  #[id number]

## 2012-09-03 LAB — PROTHROMBIN GENE MUTATION

## 2012-09-04 ENCOUNTER — Encounter: Payer: Self-pay | Admitting: Oncology

## 2012-09-04 NOTE — Progress Notes (Unsigned)
Lab data from 08/29/2012:  *** NEGATIVE FOR PROTHROMBIN II GENE MUTATION ***

## 2012-09-14 ENCOUNTER — Other Ambulatory Visit: Payer: Self-pay

## 2012-11-08 ENCOUNTER — Telehealth: Payer: Self-pay

## 2012-11-08 NOTE — Telephone Encounter (Signed)
Pt called at [phone] with c/o chest pains in the same location as when she was dx with multiple blood clots in the lung. When I called back there was no answer. I lvm to go to ER with CP, difficulty breathing, pain in jaw or down arm or in back. To please call us back with update. I also lvm with Greig Castilla on his cell phone. No answer on home phone. Information forwarded to Dr Arline Asp.

## 2012-11-15 ENCOUNTER — Encounter (HOSPITAL_COMMUNITY): Payer: Self-pay | Admitting: Emergency Medicine

## 2012-11-15 ENCOUNTER — Emergency Department (HOSPITAL_COMMUNITY): Payer: BC Managed Care – PPO

## 2012-11-15 ENCOUNTER — Emergency Department (HOSPITAL_COMMUNITY)
Admission: EM | Admit: 2012-11-15 | Discharge: 2012-11-15 | Disposition: A | Discharge status: Home / Self Care | Payer: BC Managed Care – PPO | Attending: Emergency Medicine | Admitting: Emergency Medicine

## 2012-11-15 DIAGNOSIS — R0982 Postnasal drip: Secondary | ICD-10-CM | POA: Insufficient documentation

## 2012-11-15 DIAGNOSIS — J3489 Other specified disorders of nose and nasal sinuses: Secondary | ICD-10-CM | POA: Insufficient documentation

## 2012-11-15 DIAGNOSIS — Z7901 Long term (current) use of anticoagulants: Secondary | ICD-10-CM | POA: Insufficient documentation

## 2012-11-15 DIAGNOSIS — R05 Cough: Secondary | ICD-10-CM | POA: Insufficient documentation

## 2012-11-15 DIAGNOSIS — Z86711 Personal history of pulmonary embolism: Secondary | ICD-10-CM | POA: Insufficient documentation

## 2012-11-15 DIAGNOSIS — Z8679 Personal history of other diseases of the circulatory system: Secondary | ICD-10-CM | POA: Insufficient documentation

## 2012-11-15 DIAGNOSIS — R059 Cough, unspecified: Secondary | ICD-10-CM | POA: Insufficient documentation

## 2012-11-15 DIAGNOSIS — R0789 Other chest pain: Secondary | ICD-10-CM | POA: Insufficient documentation

## 2012-11-15 DIAGNOSIS — J069 Acute upper respiratory infection, unspecified: Secondary | ICD-10-CM

## 2012-11-15 DIAGNOSIS — J309 Allergic rhinitis, unspecified: Secondary | ICD-10-CM | POA: Insufficient documentation

## 2012-11-15 DIAGNOSIS — R490 Dysphonia: Secondary | ICD-10-CM | POA: Insufficient documentation

## 2012-11-15 DIAGNOSIS — Z9109 Other allergy status, other than to drugs and biological substances: Secondary | ICD-10-CM

## 2012-11-15 LAB — COMPREHENSIVE METABOLIC PANEL
ALT: 11 U/L (ref 0–35)
AST: 15 U/L (ref 0–37)
Albumin: 3.3 g/dL — ABNORMAL LOW (ref 3.5–5.2)
Alkaline Phosphatase: 99 U/L (ref 39–117)
BUN: 12 mg/dL (ref 6–23)
CO2: 26 mEq/L (ref 19–32)
Calcium: 9.1 mg/dL (ref 8.4–10.5)
Chloride: 104 mEq/L (ref 96–112)
Creatinine, Ser: 0.73 mg/dL (ref 0.50–1.10)
GFR calc Af Amer: >90 mL/min (ref >90)
GFR calc non Af Amer: >90 mL/min (ref >90)
Glucose, Bld: 83 mg/dL (ref 70–99)
Potassium: 3.5 mEq/L (ref 3.5–5.1)
Sodium: 138 mEq/L (ref 135–145)
Total Bilirubin: 0.4 mg/dL (ref 0.3–1.2)
Total Protein: 8 g/dL (ref 6.0–8.3)

## 2012-11-15 LAB — CBC
HCT: 40.6 % (ref 36.0–46.0)
Hemoglobin: 14 g/dL (ref 12.0–15.0)
MCH: 30 pg (ref 26.0–34.0)
MCHC: 34.5 g/dL (ref 30.0–36.0)
MCV: 87.1 fL (ref 78.0–100.0)
Platelets: 233 10*3/uL (ref 150–400)
RBC: 4.66 MIL/uL (ref 3.87–5.11)
RDW: 13.1 % (ref 11.5–15.5)
WBC: 8.6 10*3/uL (ref 4.0–10.5)

## 2012-11-15 LAB — POCT I-STAT TROPONIN I: Troponin i, poc: 0 ng/mL (ref 0.00–0.08)

## 2012-11-15 LAB — PROTIME-INR
INR: 2.33 — ABNORMAL HIGH (ref 0.00–1.49)
Prothrombin Time: 24.5 seconds — ABNORMAL HIGH (ref 11.6–15.2)

## 2012-11-15 MED ORDER — CETIRIZINE-PSEUDOEPHEDRINE ER 5-120 MG PO TB12: 1.0000 | ORAL_TABLET | Freq: Two times a day (BID) | ORAL | Status: DC

## 2012-11-15 MED ORDER — GI COCKTAIL ~~LOC~~
30.0000 mL | Freq: Once | ORAL | Status: AC
  Administered 2012-11-15: 30 mL via ORAL
  Filled 2012-11-15: qty 30

## 2012-11-15 NOTE — ED Notes (Signed)
Pt alert and mentating appropriately upon d/c. Pt given d/c teaching and prescription. Pt verbalizes understanding of d/c teaching and has no further questions upon d/c. NAD noted upon d/c. Pt ambulatory leaving ED with d/c paperwork and prescription.

## 2012-11-15 NOTE — ED Notes (Signed)
Pt states chest pain started one week prior; pt states pain increases when eating and drinking; pt denies associated symptoms; pt denies numbness and tingling; pt denies dizziness and lightheadedness; pt denies n/v/d. Pt alert and mentating appropriately. NAD noted at this time. Pt skin warm and dry.

## 2012-11-15 NOTE — ED Provider Notes (Addendum)
History     CSN: [id number]  Arrival date & time 11/15/12  1433   First MD Initiated Contact with Patient 11/15/12 1527      Chief Complaint  Patient presents with  . Chest Pain    (Consider location/radiation/quality/duration/timing/severity/associated sxs/prior treatment) HPI Comments: Pt states for the last 1 week she has had chest wall pain. It is an achy type of pain that is worse when she moves her arms a certain way or coughs. She complains of mild nasal congestion and a cough that's nonproductive and started yesterday. Also yesterday she developed a hoarse voice. She denies any sore throat or fever. No shortness of breath, abdominal pain or vomiting. Symptoms are not exacerbated by exertion or swallowing. However when she swallows she feels a fullness in her throat. No choking and she fatigue normally. Symptoms are not worsened by needing, laying down. She has not had a bad taste in her mouth when she wakes up in the morning.  The history is provided by the patient.    Past Medical History  Diagnosis Date  . Pulmonary embolism, blood-clot, obstetric 2002    on birth control-pulm blood clot-treated with coumadin-no problems now    Past Surgical History  Procedure Laterality Date  . No past surgeries    . Tubal ligation  04/21/2011    Procedure: ESSURE TUBAL STERILIZATION;  Surgeon: Roseanna Rainbow, MD;  Location: [facility];  Service: Gynecology;  Laterality: Bilateral;   attempted essure,removal of IUD, hysteroscopy,         dilatation and currettage  . Iud removal  OCT 2012  . Laparoscopic tubal ligation  07/14/2011    Procedure: LAPAROSCOPIC TUBAL LIGATION;  Surgeon: Roseanna Rainbow, MD;  Location: [facility];  Service: Gynecology;  Laterality: N/A;    No family history on file.  History  Substance Use Topics  . Smoking status: Never Smoker   . Smokeless tobacco: Not on file  . Alcohol Use: No    OB History   Grav Para Term Preterm Abortions TAB SAB Ect Mult  Living                  Review of Systems  Constitutional: Negative for fever, chills and appetite change.  HENT: Positive for congestion, voice change and postnasal drip. Negative for sore throat, rhinorrhea, drooling, trouble swallowing and neck pain.   Respiratory: Positive for cough. Negative for shortness of breath.   Cardiovascular: Positive for chest pain.  Gastrointestinal: Negative for nausea, vomiting and abdominal pain.  All other systems reviewed and are negative.    Allergies  Sulfa antibiotics  Home Medications   Current Outpatient Rx  Name  Route  Sig  Dispense  Refill  . EXPIRED: Rivaroxaban (XARELTO) 20 MG TABS   Oral   Take 1 tablet (20 mg total) by mouth daily.   90 tablet   3   . Rivaroxaban (XARELTO) 20 MG TABS   Oral   Take 20 mg by mouth daily.           BP 104/67  Pulse 82  Temp(Src) 98.3 F (36.8 C) (Oral)  Resp 20  SpO2 98%  Physical Exam  Nursing note and vitals reviewed. Constitutional: She is oriented to person, place, and time. She appears well-developed and well-nourished. No distress.  Hoarse voice noted  HENT:  Head: Normocephalic and atraumatic.  Nose: Mucosal edema and rhinorrhea present.  Mouth/Throat: Mucous membranes are normal. Posterior oropharyngeal erythema present.  Eyes: Conjunctivae and EOM  are normal. Pupils are equal, round, and reactive to light.  Neck: Normal range of motion. Neck supple.  Cardiovascular: Normal rate, regular rhythm and intact distal pulses.   No murmur heard. Pulmonary/Chest: Effort normal and breath sounds normal. No respiratory distress. She has no wheezes. She has no rales. She exhibits tenderness.  Abdominal: Soft. She exhibits no distension. There is no tenderness. There is no rebound and no guarding.  Musculoskeletal: Normal range of motion. She exhibits no edema and no tenderness.  Lymphadenopathy:    She has no cervical adenopathy.  Neurological: She is alert and oriented to person,  place, and time.  Skin: Skin is warm and dry. No rash noted. No erythema.  Psychiatric: She has a normal mood and affect. Her behavior is normal.    ED Course  Procedures (including critical care time)  Labs Reviewed  COMPREHENSIVE METABOLIC PANEL - Abnormal; Notable for the following:    Albumin 3.3 (*)    All other components within normal limits  PROTIME-INR - Abnormal; Notable for the following:    Prothrombin Time 24.5 (*)    INR 2.33 (*)    All other components within normal limits  CBC  POCT I-STAT TROPONIN I   Dg Chest 2 View  11/15/2012  *RADIOLOGY REPORT*  Clinical Data: Chest pain  CHEST - 2 VIEW  Comparison: CT chest 08/23/2012  Findings: The heart size and mediastinal contours are within normal limits.  Both lungs are clear.  The visualized skeletal structures are unremarkable.  IMPRESSION: Negative exam.   Original Report Authenticated By: Signa Kell, M.D.      Date: 11/15/2012  Rate: 78  Rhythm: normal sinus rhythm  QRS Axis: normal  Intervals: normal  ST/T Wave abnormalities: normal  Conduction Disutrbances: none  Narrative Interpretation: unremarkable      1. URI (upper respiratory infection)   2. Environmental allergies       MDM   Patient with one week of intermittent musculoskeletal type chest pain that is worse when she moves certain directions and also states that hurts when she swallows. She denies any shortness of breath and only a mild dry cough that started yesterday. Also 2 days ago she lost her voice and has had a hoarse voice since. She denies any sore throat states she has had drainage and some congestion. 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 Her only pertinent medical history is of pulmonary embolism that she had last November and has been on Cymbalta for since. She takes was read to every day and has not missed any doses. She denies any shortness of breath or symptoms that were typical when she had the pulmonary  embolism. Her EKG today is within normal limits her blood pressure is normal her CBC, BMP, troponin and chest x-ray are all within normal limits. Illness likely patient's symptoms are a viral URI versus allergies. She does not have symptoms suggestive of GERD such as burning epigastric or chest pain after eating exacerbation of symptoms with lying down or a metallic taste in her mouth when she wakes up in the morning and no prior history of GERD. However patient wanted to try a GI cocktail to see if there was any resolution of her symptoms.  4:48 PM No improvement with GI cocktail.  Recommended pt try zyrtec and f/u with PCP if not improving.      Gwyneth Sprout, MD 11/15/12 1648  Gwyneth Sprout, MD 11/15/12 [phone]

## 2012-11-15 NOTE — ED Notes (Signed)
Cp x 1 week started when she swolowed it hurt now hurts constant  No n/v/sob her voice is hoarse no cough

## 2012-11-19 ENCOUNTER — Emergency Department (HOSPITAL_COMMUNITY)
Admission: EM | Admit: 2012-11-19 | Discharge: 2012-11-19 | Disposition: A | Discharge status: Home / Self Care | Payer: BC Managed Care – PPO | Attending: Emergency Medicine | Admitting: Emergency Medicine

## 2012-11-19 ENCOUNTER — Other Ambulatory Visit: Payer: Self-pay

## 2012-11-19 ENCOUNTER — Emergency Department (HOSPITAL_COMMUNITY): Payer: BC Managed Care – PPO

## 2012-11-19 ENCOUNTER — Encounter (HOSPITAL_COMMUNITY): Payer: Self-pay | Admitting: *Deleted

## 2012-11-19 DIAGNOSIS — J3489 Other specified disorders of nose and nasal sinuses: Secondary | ICD-10-CM | POA: Insufficient documentation

## 2012-11-19 DIAGNOSIS — R079 Chest pain, unspecified: Secondary | ICD-10-CM

## 2012-11-19 DIAGNOSIS — R05 Cough: Secondary | ICD-10-CM | POA: Insufficient documentation

## 2012-11-19 DIAGNOSIS — R059 Cough, unspecified: Secondary | ICD-10-CM | POA: Insufficient documentation

## 2012-11-19 DIAGNOSIS — Z86711 Personal history of pulmonary embolism: Secondary | ICD-10-CM | POA: Insufficient documentation

## 2012-11-19 DIAGNOSIS — R0982 Postnasal drip: Secondary | ICD-10-CM | POA: Insufficient documentation

## 2012-11-19 DIAGNOSIS — Z79899 Other long term (current) drug therapy: Secondary | ICD-10-CM | POA: Insufficient documentation

## 2012-11-19 LAB — CBC
HCT: 37.6 % (ref 36.0–46.0)
Hemoglobin: 12.7 g/dL (ref 12.0–15.0)
MCH: 29.3 pg (ref 26.0–34.0)
MCHC: 33.8 g/dL (ref 30.0–36.0)
MCV: 86.6 fL (ref 78.0–100.0)
Platelets: 275 10*3/uL (ref 150–400)
RBC: 4.34 MIL/uL (ref 3.87–5.11)
RDW: 13 % (ref 11.5–15.5)
WBC: 7.5 10*3/uL (ref 4.0–10.5)

## 2012-11-19 LAB — BASIC METABOLIC PANEL
BUN: 9 mg/dL (ref 6–23)
CO2: 28 mEq/L (ref 19–32)
Calcium: 9.1 mg/dL (ref 8.4–10.5)
Chloride: 104 mEq/L (ref 96–112)
Creatinine, Ser: 0.78 mg/dL (ref 0.50–1.10)
GFR calc Af Amer: >90 mL/min (ref >90)
GFR calc non Af Amer: >90 mL/min (ref >90)
Glucose, Bld: 103 mg/dL — ABNORMAL HIGH (ref 70–99)
Potassium: 4.1 mEq/L (ref 3.5–5.1)
Sodium: 139 mEq/L (ref 135–145)

## 2012-11-19 LAB — POCT I-STAT TROPONIN I: Troponin i, poc: 0 ng/mL (ref 0.00–0.08)

## 2012-11-19 MED ORDER — ALBUTEROL SULFATE (5 MG/ML) 0.5% IN NEBU
2.5000 mg | INHALATION_SOLUTION | RESPIRATORY_TRACT | Status: AC
  Administered 2012-11-19: 2.5 mg via RESPIRATORY_TRACT
  Filled 2012-11-19: qty 0.5

## 2012-11-19 MED ORDER — PREDNISONE 20 MG PO TABS
60.0000 mg | ORAL_TABLET | ORAL | Status: AC
  Administered 2012-11-19: 60 mg via ORAL
  Filled 2012-11-19: qty 3

## 2012-11-19 MED ORDER — PREDNISONE 20 MG PO TABS: 60.0000 mg | ORAL_TABLET | Freq: Every day | ORAL | Status: AC

## 2012-11-19 MED ORDER — ALBUTEROL SULFATE HFA 108 (90 BASE) MCG/ACT IN AERS: 1.0000 | INHALATION_SPRAY | Freq: Four times a day (QID) | RESPIRATORY_TRACT | Status: DC | PRN

## 2012-11-19 MED ORDER — HYDROCODONE-HOMATROPINE 5-1.5 MG/5ML PO SYRP: 5.0000 mL | ORAL_SOLUTION | Freq: Four times a day (QID) | ORAL | Status: DC | PRN

## 2012-11-19 NOTE — ED Notes (Signed)
History of blood clots and is on xaralto

## 2012-11-19 NOTE — ED Notes (Signed)
XR tech came to retrieve pt for 2 view XR, this RN explained we need a 10 minute delay on transport d/t breathing tx in process

## 2012-11-19 NOTE — ED Notes (Signed)
Pt has been having chest pain for over one week and has been here sometime last week.  Pt states chest pain is continuous to center of chest, no sob, but reports fatigue

## 2012-11-19 NOTE — ED Notes (Signed)
Pt presents with mid-sternum chest pressure, nasal congestion w/stuffy nose at night, productive cough w/yellow colored sputum, and fatigue x10 days. Pt also reports Right arm pain starting today that increases with movement but denies injury to the extremity. Pt denies SOB, abd/back pain, N/V/D, or diaphoresis. Pt denies anything make the pain better and nothing makes the pain worse. Pt was seen here 1 week ago for the same, dx w/allergic rhinitis and prescribed pt Zyrtec, pt denies any relief with her symptoms

## 2012-11-19 NOTE — ED Notes (Signed)
Pt dc'd home w/all belongings, alert and ambulatory upon dc, pt prescribed 3 new rx, pt verbalizes understanding of dc instructions, driven home by spouse, no narcotics given in ED

## 2012-11-19 NOTE — ED Notes (Signed)
Family at bedside. 

## 2012-11-19 NOTE — ED Provider Notes (Signed)
History     CSN: [id number]  Arrival date & time 11/19/12  1427   First MD Initiated Contact with Patient 11/19/12 1538      Chief Complaint  Patient presents with  . Chest Pain    HPI  This patient with a notable history of PE, currently taking her anticoagulant now presents for second time in several days with ongoing cough, congestion, chest pain. Symptoms began approximately one week ago, and after initial presentation she states that she has continued to have right-sided sternal discomfort with exacerbation upon right arm movement.  The pain is sore. There is now new, increasing cough, congestion both in her throat and nasal passages. She denies fevers, chills, vomiting, diarrhea, dyspnea. She explicitly states that this is very different from her prior pulmonary embolism. Her husband states that he has similar symptoms. She has not taken any pain medication for relief thus far. She has taken over-the-counter cough suppressant, but no pain medicine since the onset of symptoms.   Past Medical History  Diagnosis Date  . Pulmonary embolism, blood-clot, obstetric 2002    on birth control-pulm blood clot-treated with coumadin-no problems now    Past Surgical History  Procedure Laterality Date  . No past surgeries    . Tubal ligation  04/21/2011    Procedure: ESSURE TUBAL STERILIZATION;  Surgeon: Roseanna Rainbow, MD;  Location: [facility];  Service: Gynecology;  Laterality: Bilateral;   attempted essure,removal of IUD, hysteroscopy,         dilatation and currettage  . Iud removal  OCT 2012  . Laparoscopic tubal ligation  07/14/2011    Procedure: LAPAROSCOPIC TUBAL LIGATION;  Surgeon: Roseanna Rainbow, MD;  Location: [facility];  Service: Gynecology;  Laterality: N/A;    No family history on file.  History  Substance Use Topics  . Smoking status: Never Smoker   . Smokeless tobacco: Not on file  . Alcohol Use: No    OB History   Grav Para Term Preterm Abortions TAB  SAB Ect Mult Living                  Review of Systems  Constitutional: Negative for fever, chills and appetite change.  HENT: Positive for congestion, voice change and postnasal drip. Negative for sore throat, rhinorrhea, drooling, trouble swallowing and neck pain.   Respiratory: Positive for cough. Negative for shortness of breath.   Cardiovascular: Positive for chest pain.  Gastrointestinal: Negative for nausea, vomiting and abdominal pain.  All other systems reviewed and are negative.    Allergies  Sulfa antibiotics  Home Medications   Current Outpatient Rx  Name  Route  Sig  Dispense  Refill  . cetirizine-pseudoephedrine (ZYRTEC-D) 5-120 MG per tablet   Oral   Take 1 tablet by mouth 2 (two) times daily.   30 tablet   0   . Rivaroxaban (XARELTO) 20 MG TABS   Oral   Take 20 mg by mouth daily.           BP 101/48  Pulse 87  Temp(Src) 98.9 F (37.2 C) (Oral)  Resp 16  SpO2 99%  Physical Exam  Nursing note and vitals reviewed. Constitutional: She is oriented to person, place, and time. She appears well-developed and well-nourished. No distress.  Hoarse voice noted  HENT:  Head: Normocephalic and atraumatic.  Nose: Mucosal edema and rhinorrhea present.  Mouth/Throat: Mucous membranes are normal. No oropharyngeal exudate, posterior oropharyngeal edema, posterior oropharyngeal erythema or tonsillar abscesses.  Eyes: Conjunctivae  and EOM are normal. Pupils are equal, round, and reactive to light.  Neck: Normal range of motion. Neck supple.  Cardiovascular: Normal rate, regular rhythm and intact distal pulses.   No murmur heard. Pulmonary/Chest: Effort normal and breath sounds normal. Not tachypneic. No respiratory distress. She has no wheezes. She has no rales.  Chest not ttp  Abdominal: Soft. She exhibits no distension. There is no tenderness. There is no rebound and no guarding.  Musculoskeletal: Normal range of motion. She exhibits no edema and no tenderness.   Lymphadenopathy:       Right cervical: No superficial cervical, no deep cervical and no posterior cervical adenopathy present.      Left cervical: No superficial cervical, no deep cervical and no posterior cervical adenopathy present.  Neurological: She is alert and oriented to person, place, and time.  Skin: Skin is warm and dry. No rash noted. No erythema.  Psychiatric: She has a normal mood and affect. Her behavior is normal.    ED Course  Procedures (including critical care time)  Labs Reviewed  BASIC METABOLIC PANEL - Abnormal; Notable for the following:    Glucose, Bld 103 (*)    All other components within normal limits  CBC  POCT I-STAT TROPONIN I   No results found.   No diagnosis found.  Pulse ox 99% room air normal Cardiac 85 sinus rhythm normal   Date: 11/19/2012  Rate: 85  Rhythm: normal sinus rhythm  QRS Axis: normal  Intervals: normal  ST/T Wave abnormalities: nonspecific T wave changes  Conduction Disutrbances:none  Narrative Interpretation:   Old EKG Reviewed: unchanged ABNORMAL   MDM  After the initial evaluation I reviewed the patient's chart, including her recent visit here.  Subsequently we discussed the differential diagnosis for her ongoing cough, congestion, chest pain.  Most prominently, the concern for PE exists, but given her complaints with medication, the absence of hypoxia, tachycardia, tachypnea, distress, any endorsement of dyspnea this seems unlikely.  We discussed the possibility of CT scan to rule out this pathology.  Given the patient's history of multiple prior CT scans, the benefit is outweighed by the risk of ongoing radiation exposure.  We agreed to attempt a trial of bronchitis therapy, with reconsideration of imaging and/or further evaluation of patient's not improved in 3 days.        Gerhard Munch, MD 11/19/12 1739

## 2012-11-19 NOTE — ED Notes (Signed)
Patient transported to X-ray 

## 2013-02-21 ENCOUNTER — Telehealth: Payer: Self-pay | Admitting: Hematology and Oncology

## 2013-02-21 NOTE — Telephone Encounter (Signed)
Lb/CP1 moved from 7/28 to 8/1 due to AM PAL 7/28. Pt has new d/t for lb/CP1 8/1 @ 3pm - date per pt.

## 2013-02-24 ENCOUNTER — Other Ambulatory Visit: Payer: BC Managed Care – PPO | Admitting: Lab

## 2013-02-24 ENCOUNTER — Ambulatory Visit: Payer: BC Managed Care – PPO

## 2013-02-28 ENCOUNTER — Other Ambulatory Visit (HOSPITAL_BASED_OUTPATIENT_CLINIC_OR_DEPARTMENT_OTHER): Payer: BC Managed Care – PPO | Admitting: Lab

## 2013-02-28 ENCOUNTER — Telehealth: Payer: Self-pay | Admitting: Hematology and Oncology

## 2013-02-28 ENCOUNTER — Ambulatory Visit (HOSPITAL_BASED_OUTPATIENT_CLINIC_OR_DEPARTMENT_OTHER): Payer: BC Managed Care – PPO | Admitting: Hematology and Oncology

## 2013-02-28 VITALS — BP 132/66 | HR 75 | Temp 98.2°F | Resp 20 | Ht 68.0 in | Wt 213.4 lb

## 2013-02-28 DIAGNOSIS — I2699 Other pulmonary embolism without acute cor pulmonale: Secondary | ICD-10-CM

## 2013-02-28 DIAGNOSIS — R0602 Shortness of breath: Secondary | ICD-10-CM

## 2013-02-28 DIAGNOSIS — N939 Abnormal uterine and vaginal bleeding, unspecified: Secondary | ICD-10-CM

## 2013-02-28 DIAGNOSIS — I82409 Acute embolism and thrombosis of unspecified deep veins of unspecified lower extremity: Secondary | ICD-10-CM

## 2013-02-28 LAB — COMPREHENSIVE METABOLIC PANEL (CC13)
ALT: 12 U/L (ref 0–55)
AST: 16 U/L (ref 5–34)
Albumin: 3.4 g/dL — ABNORMAL LOW (ref 3.5–5.0)
Alkaline Phosphatase: 116 U/L (ref 40–150)
BUN: 12.1 mg/dL (ref 7.0–26.0)
CO2: 23 mEq/L (ref 22–29)
Calcium: 9.2 mg/dL (ref 8.4–10.4)
Chloride: 108 mEq/L (ref 98–109)
Creatinine: 0.8 mg/dL (ref 0.6–1.1)
Glucose: 93 mg/dl (ref 70–140)
Potassium: 4.1 mEq/L (ref 3.5–5.1)
Sodium: 142 mEq/L (ref 136–145)
Total Bilirubin: <0.2 mg/dL (ref 0.20–1.20)
Total Protein: 7.6 g/dL (ref 6.4–8.3)

## 2013-02-28 LAB — CBC WITH DIFFERENTIAL/PLATELET
BASO%: 0.7 % (ref 0.0–2.0)
Basophils Absolute: 0.1 10*3/uL (ref 0.0–0.1)
EOS%: 3.3 % (ref 0.0–7.0)
Eosinophils Absolute: 0.3 10*3/uL (ref 0.0–0.5)
HCT: 38.2 % (ref 34.8–46.6)
HGB: 12.5 g/dL (ref 11.6–15.9)
LYMPH%: 33.2 % (ref 14.0–49.7)
MCH: 29.5 pg (ref 25.1–34.0)
MCHC: 32.7 g/dL (ref 31.5–36.0)
MCV: 90.1 fL (ref 79.5–101.0)
MONO#: 0.5 10*3/uL (ref 0.1–0.9)
MONO%: 6.3 % (ref 0.0–14.0)
NEUT#: 4.3 10*3/uL (ref 1.5–6.5)
NEUT%: 56.5 % (ref 38.4–76.8)
Platelets: 217 10*3/uL (ref 145–400)
RBC: 4.24 10*6/uL (ref 3.70–5.45)
RDW: 13.4 % (ref 11.2–14.5)
WBC: 7.6 10*3/uL (ref 3.9–10.3)
lymph#: 2.5 10*3/uL (ref 0.9–3.3)
nRBC: 0 % (ref 0–0)

## 2013-02-28 LAB — LACTATE DEHYDROGENASE (CC13): LDH: 227 U/L (ref 125–245)

## 2013-02-28 NOTE — Telephone Encounter (Signed)
gv pt appt schedule for November.  °

## 2013-02-28 NOTE — Progress Notes (Signed)
CC:   [name], MD [name], M.D.   PROBLEM LIST:  1. Pulmonary embolism, acute bilateral with high clot burden in  association with an asymptomatic DVT of the right lower leg  diagnosed on 06/07/2012. Currently being treated with Xarelto  (rivaroxaban). Doppler study of the right leg carried out on 08/20/2012 and CT angiogram of the chest carried out on 08/23/2012 showed resolution of previous blood clots. Hypercoagulable workup carried out on 06/08/2012 was negative but did not include the prothrombin II gene mutation which we are checking today, so that is pending. 2. DVT involving the right lower extremity, asymptomatic at  presentation with clots seen in the right distal popliteal,  peroneal, and posterior tibial veins on Doppler study from  06/08/2012.  3. History of pulmonary embolism in association with birth control  pills in 2001.  4. History of tubal ligation in December 2012.  5. History of left plantar fasciitis in December 2012. 6. Shortness of breath noted since November 2013. 7. Weight gain, 783.1. 8. Hypoalbuminemia noted 08/29/2012.  MEDICATIONS:   The patient's only medicine is Xarelto (rivaroxaban) 20 mg daily.  SMOKING HISTORY:  The patient has never smoked cigarettes.  HISTORY:  I am seeing Bianca Simmons today for followup of her hypercoagulable state, characterized by  bilateral pulmonary emboli with high clot burden, in association with an asymptomatic DVT of the right lower leg, diagnosed in early November 2013. She is now on 20 mg daily and taking her medicines faithfully.  The importance of this has been emphasized to her repeatedly. No bleeding episodes, no chest pain, minimal SOB on exertion.  Blood pressure 132/66, pulse 75, temperature 98.2 F (36.8 C), temperature source Oral, resp. rate 20, height 5\' 8"  (1.727 m), weight 213 lb 6.4 oz (96.798 kg). PHYSICAL EXAMINATION:  The patient looks well.   There was no scleral icterus.  Mouth and  pharynx are benign.  No peripheral adenopathy palpable.  Heart and lungs were normal.  Pulse was 70 and regular.  Breasts were not examined.  No axillary adenopathy.  Abdomen is benign.  Extremities:  No peripheral edema or calf tenderness.  LABORATORY DATA:   CBC    Component Value Date/Time   WBC 7.5 11/19/2012 1514   WBC 5.6 08/29/2012 0832   RBC 4.34 11/19/2012 1514   RBC 4.12 08/29/2012 0832   HGB 12.7 11/19/2012 1514   HGB 12.6 08/29/2012 0832   HCT 37.6 11/19/2012 1514   HCT 36.8 08/29/2012 0832   PLT 275 11/19/2012 1514   PLT 216 08/29/2012 0832   MCV 86.6 11/19/2012 1514   MCV 89.2 08/29/2012 0832   MCH 29.3 11/19/2012 1514   MCH 30.5 08/29/2012 0832   MCHC 33.8 11/19/2012 1514   MCHC 34.2 08/29/2012 0832   RDW 13.0 11/19/2012 1514   RDW 14.0 08/29/2012 0832   LYMPHSABS 1.5 08/29/2012 0832   LYMPHSABS 2.2 06/07/2012 1408   MONOABS 0.4 08/29/2012 0832   MONOABS 0.3 06/07/2012 1408   EOSABS 0.1 08/29/2012 0832   EOSABS 0.2 06/07/2012 1408   BASOSABS 0.0 08/29/2012 0832   BASOSABS 0.0 06/07/2012 1408    Lab Results  Component Value Date   GLUCOSE 103* 11/19/2012   BUN 9 11/19/2012   CO2 28 11/19/2012   ALT 11 11/15/2012   AST 15 11/15/2012   LDH 184 08/29/2012   K 4.1 11/19/2012   CREATININE 0.78 11/19/2012    IMAGING STUDIES:  1. CT angiogram of the chest with IV contrast from 03/10/2004,  showed  no evidence for pulmonary embolism. There was mild central  peribronchial thickening with mildly enlarged bilateral hilar lymph  nodes, felt to be most likely reactive. Screening mammogram  bilateral on 01/31/2011, showed possible mass right breast with  additional evaluation indicated.  2. Digital diagnostic right limited mammogram showed no persistent  worrisome abnormality upon additional imaging of the right breast.  3. MRI of the head without IV contrast on 02/24/2012 was normal.  4. Chest x-ray, 2 view, on 06/07/2012, showed no evidence for acute  cardiopulmonary abnormality.  5. CT  angiogram of the chest on 06/07/2012, showed large amount of  bilateral pulmonary embolus noted involving all lobes. Most of  this appears acute and clot burden is high. There was no reversal  of the inter ventriculoseptal contour. There was only minimal  reflux of contrast medium into the IVC. Scattered upper  mediastinal lymph nodes were in the upper normal size range. There  was mild atelectasis noted posteriorly in the right upper lobe.  There was faint interstitial accentuation in the upper lobes.  Thoracic spondylosis was noted.  6. Bilateral lower extremity venous duplex evaluation on 06/08/2012,  showed findings consistent with acute deep vein thrombosis  involving the right distal popliteal, peroneal and posterior tibial  veins. There was no evidence of deep vein thrombosis involving the  left lower extremity. No evidence of a Baker's cyst on the right  or left. 7. Lower extremity venous duplex of the right lower extremity carried out on 08/20/2012 showed no evidence of deep vein or superficial thrombosis involving the right lower extremity.  DVT noted on 06/05/2012 appears to have resolved. 8. CT scan of the chest with IV contrast carried out on 08/23/2012 showed no acute cardiopulmonary disease.  There was interval recanalization of the central pulmonary arteries bilaterally since the prior CT angiogram carried out on June 07, 2012.  There was no evidence of right heart strain.  No pericardial effusion or visible coronary atherosclerosis. There was minimal atherosclerosis involving the aortic arch.  Lungs were clear.  IMPRESSION AND PLAN:  The patient is doing well at this time with the exception of very minimal residual shortness of breath.    The patient again has been cautioned about the use of hormonal agents.  She was encouraged to obtain yearly mammograms and to get a colonoscopy when she turns age 47.  She understands that she needs to stay on the  Xarelto indefinitely, unless this is changed to another anticoagulant.  She understands to call us for any bleeding, bruising, leg swelling, pain or chest symptomatology, especially worsening of shortness of breath.  We will plan to see the patient again in 4 months, at which time we will check CBC and chemistries.  Currently the prothrombin II gene mutation is pending.   Zachery Dakins, MD 02/28/2013 4:22 PM

## 2013-06-05 ENCOUNTER — Other Ambulatory Visit: Payer: Self-pay

## 2013-06-27 ENCOUNTER — Other Ambulatory Visit: Payer: BC Managed Care – PPO | Admitting: Lab

## 2013-06-27 ENCOUNTER — Other Ambulatory Visit: Payer: Self-pay | Admitting: Internal Medicine

## 2013-06-27 ENCOUNTER — Ambulatory Visit: Payer: BC Managed Care – PPO

## 2013-06-27 DIAGNOSIS — I2699 Other pulmonary embolism without acute cor pulmonale: Secondary | ICD-10-CM

## 2014-05-05 ENCOUNTER — Telehealth: Payer: Self-pay | Admitting: Hematology

## 2014-05-05 ENCOUNTER — Other Ambulatory Visit: Payer: Self-pay

## 2014-05-05 NOTE — Telephone Encounter (Signed)
pt called requesting appt - former DM pt last seen nov 2014. pt given appt for  10/16.

## 2014-05-14 ENCOUNTER — Telehealth: Payer: Self-pay | Admitting: Hematology

## 2014-05-14 NOTE — Telephone Encounter (Signed)
S/w pt confirmed labs/ov per provider schedule r/s from 10/16 .Marland Kitchen... KJ

## 2014-05-14 NOTE — Telephone Encounter (Signed)
Lft msg per provider schedule r/s 10/16 to 10/22, advised pt if she couldn't make this time to call our office and ask for scheduling .... KJ

## 2014-05-15 ENCOUNTER — Ambulatory Visit: Payer: BC Managed Care – PPO

## 2014-05-15 ENCOUNTER — Other Ambulatory Visit: Payer: BC Managed Care – PPO

## 2014-05-21 ENCOUNTER — Ambulatory Visit: Payer: BC Managed Care – PPO

## 2014-05-21 ENCOUNTER — Other Ambulatory Visit: Payer: BC Managed Care – PPO

## 2014-06-02 ENCOUNTER — Other Ambulatory Visit: Payer: Self-pay | Admitting: *Deleted

## 2014-06-02 DIAGNOSIS — N939 Abnormal uterine and vaginal bleeding, unspecified: Secondary | ICD-10-CM

## 2014-06-02 DIAGNOSIS — I2699 Other pulmonary embolism without acute cor pulmonale: Secondary | ICD-10-CM

## 2014-06-03 ENCOUNTER — Other Ambulatory Visit: Payer: BC Managed Care – PPO

## 2014-06-03 ENCOUNTER — Ambulatory Visit: Payer: BC Managed Care – PPO

## 2014-07-29 ENCOUNTER — Ambulatory Visit: Payer: BC Managed Care – PPO | Admitting: Obstetrics & Gynecology

## 2014-10-22 ENCOUNTER — Telehealth: Payer: Self-pay | Admitting: Hematology

## 2014-10-22 NOTE — Telephone Encounter (Signed)
Confirm appointment for APril 14. Mailed calendar.

## 2014-11-03 ENCOUNTER — Ambulatory Visit: Payer: Self-pay | Admitting: Hematology

## 2014-11-03 ENCOUNTER — Other Ambulatory Visit: Payer: Self-pay

## 2014-11-11 ENCOUNTER — Other Ambulatory Visit: Payer: Self-pay | Admitting: *Deleted

## 2014-11-11 DIAGNOSIS — I2699 Other pulmonary embolism without acute cor pulmonale: Secondary | ICD-10-CM

## 2014-11-11 DIAGNOSIS — I82409 Acute embolism and thrombosis of unspecified deep veins of unspecified lower extremity: Secondary | ICD-10-CM

## 2014-11-12 ENCOUNTER — Other Ambulatory Visit (HOSPITAL_BASED_OUTPATIENT_CLINIC_OR_DEPARTMENT_OTHER): Payer: BC Managed Care – PPO

## 2014-11-12 ENCOUNTER — Telehealth: Payer: Self-pay | Admitting: Hematology

## 2014-11-12 ENCOUNTER — Ambulatory Visit (HOSPITAL_BASED_OUTPATIENT_CLINIC_OR_DEPARTMENT_OTHER): Payer: BC Managed Care – PPO | Admitting: Hematology

## 2014-11-12 ENCOUNTER — Other Ambulatory Visit: Payer: Self-pay | Admitting: *Deleted

## 2014-11-12 ENCOUNTER — Encounter: Payer: Self-pay | Admitting: Hematology

## 2014-11-12 VITALS — BP 116/49 | HR 71 | Temp 98.2°F | Resp 18 | Ht 68.0 in | Wt 201.5 lb

## 2014-11-12 DIAGNOSIS — I2699 Other pulmonary embolism without acute cor pulmonale: Secondary | ICD-10-CM

## 2014-11-12 DIAGNOSIS — I82409 Acute embolism and thrombosis of unspecified deep veins of unspecified lower extremity: Secondary | ICD-10-CM | POA: Diagnosis not present

## 2014-11-12 DIAGNOSIS — Z86718 Personal history of other venous thrombosis and embolism: Secondary | ICD-10-CM

## 2014-11-12 LAB — CBC WITH DIFFERENTIAL/PLATELET
BASO%: 0.3 % (ref 0.0–2.0)
Basophils Absolute: 0 10*3/uL (ref 0.0–0.1)
EOS%: 1.5 % (ref 0.0–7.0)
Eosinophils Absolute: 0.1 10*3/uL (ref 0.0–0.5)
HCT: 37.4 % (ref 34.8–46.6)
HGB: 12.1 g/dL (ref 11.6–15.9)
LYMPH%: 31.9 % (ref 14.0–49.7)
MCH: 29.3 pg (ref 25.1–34.0)
MCHC: 32.4 g/dL (ref 31.5–36.0)
MCV: 90.6 fL (ref 79.5–101.0)
MONO#: 0.4 10*3/uL (ref 0.1–0.9)
MONO%: 7.5 % (ref 0.0–14.0)
NEUT#: 3.4 10*3/uL (ref 1.5–6.5)
NEUT%: 58.8 % (ref 38.4–76.8)
Platelets: 220 10*3/uL (ref 145–400)
RBC: 4.13 10*6/uL (ref 3.70–5.45)
RDW: 13.2 % (ref 11.2–14.5)
WBC: 5.8 10*3/uL (ref 3.9–10.3)
lymph#: 1.9 10*3/uL (ref 0.9–3.3)

## 2014-11-12 LAB — COMPREHENSIVE METABOLIC PANEL (CC13)
ALT: 12 U/L (ref 0–55)
AST: 14 U/L (ref 5–34)
Albumin: 3.3 g/dL — ABNORMAL LOW (ref 3.5–5.0)
Alkaline Phosphatase: 95 U/L (ref 40–150)
Anion Gap: 6 mEq/L (ref 3–11)
BUN: 11.4 mg/dL (ref 7.0–26.0)
CO2: 29 mEq/L (ref 22–29)
Calcium: 8.5 mg/dL (ref 8.4–10.4)
Chloride: 107 mEq/L (ref 98–109)
Creatinine: 0.8 mg/dL (ref 0.6–1.1)
EGFR: >90 mL/min/{1.73_m2} (ref >90)
Glucose: 80 mg/dl (ref 70–140)
Potassium: 4 mEq/L (ref 3.5–5.1)
Sodium: 142 mEq/L (ref 136–145)
Total Bilirubin: 0.39 mg/dL (ref 0.20–1.20)
Total Protein: 7 g/dL (ref 6.4–8.3)

## 2014-11-12 MED ORDER — RIVAROXABAN 20 MG PO TABS: 20.0000 mg | ORAL_TABLET | Freq: Every day | ORAL | Status: DC

## 2014-11-12 NOTE — Progress Notes (Signed)
[facility]  Telephone:([phone]) [phone] Fax:([phone]) [phone]  Clinic Follow up Note   Patient Care Team: Kristie Cowman, MD as PCP - General (Family Medicine) 11/12/2014  DIAGNOSIS:  1. Pulmonary embolism, acute bilateral with high clot burden in association with an asymptomatic DVT of the right lower leg  diagnosed on 06/07/2012. She was treated with Xarelto (rivaroxaban). Doppler study of the right leg carried out on 08/20/2012 and CT angiogram of the chest carried out on 08/23/2012 showed resolution of previous blood clots. Hypercoagulable workup carried out on 06/08/2012 was negative  2. DVT involving the right lower extremity, asymptomatic at presentation with clots seen in the right distal popliteal, peroneal, and posterior tibial veins on Doppler study from 06/08/2012.  3. History of pulmonary embolism in association with birth control pills in 2001.   INTERVAL HISTORY: She was previously seen by Dr. Ralene Ok, last visit on 08/29/2012. She subsequently lost to follow-up. She took Xarelto for several months in 2013 and 2014, the stopped on her own. She recently called our cancer center and request a follow-up appointment. This is my first encounter with her. She has been doing well in the past 2 years, denies any symptoms, such as chest pain, dyspnea, leg swollen pain.  REVIEW OF SYSTEMS:   Constitutional: Denies fevers, chills or abnormal weight loss Eyes: Denies blurriness of vision Ears, nose, mouth, throat, and face: Denies mucositis or sore throat Respiratory: Denies cough, dyspnea or wheezes Cardiovascular: Denies palpitation, chest discomfort or lower extremity swelling Gastrointestinal:  Denies nausea, heartburn or change in bowel habits Skin: Denies abnormal skin rashes Lymphatics: Denies new lymphadenopathy or easy bruising Neurological:Denies numbness, tingling or new weaknesses Behavioral/Psych: Mood is stable, no new changes  All other systems were  reviewed with the patient and are negative.  MEDICAL HISTORY:  Past Medical History  Diagnosis Date  . Pulmonary embolism, blood-clot, obstetric 2002    on birth control-pulm blood clot-treated with coumadin-no problems now    SURGICAL HISTORY: Past Surgical History  Procedure Laterality Date  . No past surgeries    . Tubal ligation  04/21/2011    Procedure: ESSURE TUBAL STERILIZATION;  Surgeon: Agnes Lawrence, MD;  Location: [facility];  Service: Gynecology;  Laterality: Bilateral;   attempted essure,removal of IUD, hysteroscopy,         dilatation and currettage  . Iud removal  OCT 2012  . Laparoscopic tubal ligation  07/14/2011    Procedure: LAPAROSCOPIC TUBAL LIGATION;  Surgeon: Agnes Lawrence, MD;  Location: [facility];  Service: Gynecology;  Laterality: N/A;    I have reviewed the social history and family history with the patient and they are unchanged from previous note.  ALLERGIES:  is allergic to sulfa antibiotics.  MEDICATIONS:  Current Outpatient Prescriptions  Medication Sig Dispense Refill  . Rivaroxaban (XARELTO) 20 MG TABS Take 20 mg by mouth daily.     No current facility-administered medications for this visit.    PHYSICAL EXAMINATION: ECOG PERFORMANCE STATUS: 0 - Asymptomatic  Filed Vitals:   11/12/14 1405  BP: 116/49  Pulse: 71  Temp: 98.2 F (36.8 C)  Resp: 18   Filed Weights   11/12/14 1405  Weight: 201 lb 8 oz (91.4 kg)    GENERAL:alert, no distress and comfortable SKIN: skin color, texture, turgor are normal, no rashes or significant lesions EYES: normal, Conjunctiva are pink and non-injected, sclera clear OROPHARYNX:no exudate, no erythema and lips, buccal mucosa, and tongue normal  NECK: supple, thyroid normal size, non-tender, without  nodularity LYMPH:  no palpable lymphadenopathy in the cervical, axillary or inguinal LUNGS: clear to auscultation and percussion with normal breathing effort HEART: regular rate & rhythm and no murmurs  and no lower extremity edema ABDOMEN:abdomen soft, non-tender and normal bowel sounds Musculoskeletal:no cyanosis of digits and no clubbing  NEURO: alert & oriented x 3 with fluent speech, no focal motor/sensory deficits  LABORATORY DATA:  I have reviewed the data as listed CBC Latest Ref Rng 11/12/2014 02/28/2013 11/19/2012  WBC 3.9 - 10.3 10e3/uL 5.8 7.6 7.5  Hemoglobin 11.6 - 15.9 g/dL 12.1 12.5 12.7  Hematocrit 34.8 - 46.6 % 37.4 38.2 37.6  Platelets 145 - 400 10e3/uL 220 217 275     CMP Latest Ref Rng 02/28/2013 11/19/2012 11/15/2012  Glucose 70 - 140 mg/dl 93 103(H) 83  BUN 7.0 - 26.0 mg/dL 12.1 9 12   Creatinine 0.6 - 1.1 mg/dL 0.8 0.78 0.73  Sodium 136 - 145 mEq/L 142 139 138  Potassium 3.5 - 5.1 mEq/L 4.1 4.1 3.5  Chloride 96 - 112 mEq/L - 104 104  CO2 22 - 29 mEq/L 23 28 26   Calcium 8.4 - 10.4 mg/dL 9.2 9.1 9.1  Total Protein 6.4 - 8.3 g/dL 7.6 - 8.0  Total Bilirubin 0.20 - 1.20 mg/dL <0.20 Repeated and Verified - 0.4  Alkaline Phos 40 - 150 U/L 116 - 99  AST 5 - 34 U/L 16 - 15  ALT 0 - 55 U/L 12 - 11      RADIOGRAPHIC STUDIES: I have personally reviewed the radiological images as listed and agreed with the findings in the report. No results found.   ASSESSMENT & PLAN:  49 year old female, with history of one episode of PE when she was on birth control pill, and second episode of unprovoked PE and DVT. She lost to follow-up subsequently.  1. Recurrent PE and DVT -She had a negative hypercoagulation workup -Giving the recurrent pulmonary and reason, especially second episode was unprovoked, young age, and low risk of bleeding, I recommend her to restart prophylactic anticoagulation indefinitely. She agrees. -We discussed the option of anti-correlation, including Coumadin, Lovenox injection and anti-Xa. She previously had all of them, and prefers with Xarelto. I discussed the pros and cons of this agent, especially no reversible agent when she bleeds on Xarelto. She voiced  good understanding -I sent a prescription of Xarelto 20 mg once daily to her pharmacy. Given this is prophylactic, I do not think she needs loading dose Xarelto. -The risk of bleeding was discussed extensively. -We also discussed other risk modifying strategies to reduce her chance of thrombosis, such as being physical active, use compression stocks on long distance travel, avoid birth control pill, avoid smoking, etc.  Follow-up: Return to clinic in 4 months with lab. If she has no issues, I'll see her every 6 months when she is on anticoagulatin.  All questions were answered. The patient knows to call the clinic with any problems, questions or concerns. No barriers to learning was detected.  I spent 30 minutes counseling the patient face to face. The total time spent in the appointment was 40 minutes and more than 50% was on counseling and review of test results     Truitt Merle, MD 11/12/2014 2:37 PM

## 2014-11-12 NOTE — Telephone Encounter (Signed)
APPOINTMENTS MADE AND AVS PRINTED FOR PATIENT °

## 2014-11-23 ENCOUNTER — Telehealth: Payer: Self-pay | Admitting: *Deleted

## 2014-11-23 NOTE — Telephone Encounter (Signed)
Faxed response from Dr. Burr Medico for Xarelto management to Donnelly Stager at Dr. Frazier Butt office:  Per Dr. Burr Medico,  Pt can stop Xarelto 2 days prior to colonoscopy and resume the day after procedure. Dr. Lorie Apley  Phone   [phone]  ;    Fax    [phone].

## 2014-11-28 ENCOUNTER — Emergency Department (HOSPITAL_COMMUNITY)
Admission: EM | Admit: 2014-11-28 | Discharge: 2014-11-28 | Disposition: A | Discharge status: Home / Self Care | Payer: BC Managed Care – PPO | Attending: Emergency Medicine | Admitting: Emergency Medicine

## 2014-11-28 ENCOUNTER — Encounter (HOSPITAL_COMMUNITY): Payer: Self-pay | Admitting: Emergency Medicine

## 2014-11-28 DIAGNOSIS — L02213 Cutaneous abscess of chest wall: Secondary | ICD-10-CM | POA: Diagnosis present

## 2014-11-28 DIAGNOSIS — L03313 Cellulitis of chest wall: Secondary | ICD-10-CM | POA: Diagnosis not present

## 2014-11-28 DIAGNOSIS — Z7901 Long term (current) use of anticoagulants: Secondary | ICD-10-CM | POA: Diagnosis not present

## 2014-11-28 DIAGNOSIS — Z86711 Personal history of pulmonary embolism: Secondary | ICD-10-CM | POA: Insufficient documentation

## 2014-11-28 MED ORDER — CLINDAMYCIN HCL 300 MG PO CAPS: 300.0000 mg | ORAL_CAPSULE | Freq: Four times a day (QID) | ORAL | Status: DC

## 2014-11-28 MED ORDER — LIDOCAINE-EPINEPHRINE 2 %-1:200000 IJ SOLN
20.0000 mL | Freq: Once | INTRAMUSCULAR | Status: AC
Start: 2014-11-28 — End: 2014-11-28
  Administered 2014-11-28: 20 mL via INTRADERMAL
  Filled 2014-11-28: qty 20

## 2014-11-28 NOTE — Discharge Instructions (Signed)

## 2014-11-28 NOTE — ED Provider Notes (Signed)
CSN: [id number]     Arrival date & time 11/28/14  5102 History   First MD Initiated Contact with Patient 11/28/14 [id number]     Chief Complaint  Patient presents with  . Abscess     (Consider location/radiation/quality/duration/timing/severity/associated sxs/prior Treatment) Patient is a 49 y.o. female presenting with abscess.  Abscess Abscess location: L parasternal chest wall. Size:  3 Abscess quality: induration, painful, redness and warmth   Red streaking: no   Duration:  2 weeks Progression:  Worsening Pain details:    Quality:  Aching   Severity:  Moderate   Duration:  2 weeks   Timing:  Constant   Progression:  Worsening Chronicity:  New Context: not diabetes, not immunosuppression, not insect bite/sting and not skin injury   Relieved by:  Nothing Worsened by:  Nothing tried Ineffective treatments:  None tried Associated symptoms: no anorexia, no fever, no nausea and no vomiting   Risk factors: no prior abscess     Past Medical History  Diagnosis Date  . Pulmonary embolism, blood-clot, obstetric 2002    on birth control-pulm blood clot-treated with coumadin-no problems now   Past Surgical History  Procedure Laterality Date  . No past surgeries    . Tubal ligation  04/21/2011    Procedure: ESSURE TUBAL STERILIZATION;  Surgeon: Agnes Lawrence, MD;  Location: [facility];  Service: Gynecology;  Laterality: Bilateral;   attempted essure,removal of IUD, hysteroscopy,         dilatation and currettage  . Iud removal  OCT 2012  . Laparoscopic tubal ligation  07/14/2011    Procedure: LAPAROSCOPIC TUBAL LIGATION;  Surgeon: Agnes Lawrence, MD;  Location: [facility];  Service: Gynecology;  Laterality: N/A;   Family History  Problem Relation Age of Onset  . Diabetes Mother   . Diabetes Father   . Diabetes Sister    History  Substance Use Topics  . Smoking status: Never Smoker   . Smokeless tobacco: Never Used  . Alcohol Use: No   OB History    No data available      Review of Systems  Constitutional: Negative for fever.  Gastrointestinal: Negative for nausea, vomiting and anorexia.  All other systems reviewed and are negative.     Allergies  Sulfa antibiotics  Home Medications   Prior to Admission medications   Medication Sig Start Date End Date Taking? Authorizing Provider  clindamycin (CLEOCIN) 300 MG capsule Take 1 capsule (300 mg total) by mouth 4 (four) times daily. X 7 days 11/28/14   Debby Freiberg, MD  rivaroxaban (XARELTO) 20 MG TABS tablet Take 1 tablet (20 mg total) by mouth daily. 11/12/14   Truitt Merle, MD   BP 114/61 mmHg  Pulse 66  Temp(Src) 98.3 F (36.8 C) (Oral)  Resp 18  Ht 5\' 8"  (1.727 m)  Wt 200 lb (90.719 kg)  BMI 30.42 kg/m2  SpO2 100% Physical Exam  Constitutional: She is oriented to person, place, and time. She appears well-developed and well-nourished.  HENT:  Head: Normocephalic and atraumatic.  Right Ear: External ear normal.  Left Ear: External ear normal.  Eyes: Conjunctivae and EOM are normal. Pupils are equal, round, and reactive to light.  Neck: Normal range of motion. Neck supple.  Cardiovascular: Normal rate, regular rhythm, normal heart sounds and intact distal pulses.   Pulmonary/Chest: Effort normal and breath sounds normal.  Abdominal: Soft. Bowel sounds are normal. There is no tenderness.  Musculoskeletal: Normal range of motion.  Neurological: She is alert and  oriented to person, place, and time.  Skin: Skin is warm and dry. Rash (3 cm fluctuant abscess to L parasternal chest wall with surrounding erythema limited to <1 cm from margin) noted.  Vitals reviewed.   ED Course  INCISION AND DRAINAGE Date/Time: 11/28/2014 4:45 AM Performed by: Debby Freiberg Authorized by: Debby Freiberg Consent: Verbal consent obtained. Risks and benefits: risks, benefits and alternatives were discussed Type: abscess Body area: trunk Location details: chest Anesthesia: local infiltration Local anesthetic:  lidocaine 1% with epinephrine Risk factor: coagulopathy (on xarelto) Scalpel size: 11 Incision type: single straight Complexity: simple Drainage: purulent Drainage amount: moderate Wound treatment: wound left open Patient tolerance: Patient tolerated the procedure well with no immediate complications   (including critical care time) Labs Review Labs Reviewed - No data to display  Imaging Review No results found.   EKG Interpretation None      MDM   Final diagnoses:  Chest wall abscess  Cellulitis of chest wall    49 y.o. female with pertinent PMH of prior PE on xarelto presents with chest wall abscess as above.  Drained uneventfully.  No complications.  DC home to fu with PCP.    I have reviewed all laboratory and imaging studies if ordered as above  1. Chest wall abscess   2. Cellulitis of chest wall         Debby Freiberg, MD 11/28/14 [phone]

## 2014-11-28 NOTE — ED Notes (Signed)
Pt states she has an abscess on her mid chest that is getting bigger and more painful. Pt having 6/10 pain on the area, is red and warm to touch. Pt went to UC during this week and stated on ABx with not relieve.

## 2015-02-10 ENCOUNTER — Telehealth: Payer: Self-pay | Admitting: Hematology

## 2015-02-10 NOTE — Telephone Encounter (Signed)
pt cld to CX will be out of town-will call back to r/s

## 2015-02-11 ENCOUNTER — Ambulatory Visit: Payer: BC Managed Care – PPO | Admitting: Hematology

## 2015-02-11 ENCOUNTER — Other Ambulatory Visit: Payer: BC Managed Care – PPO

## 2015-03-03 ENCOUNTER — Encounter (HOSPITAL_COMMUNITY): Payer: Self-pay | Admitting: Family Medicine

## 2015-03-03 ENCOUNTER — Emergency Department (HOSPITAL_COMMUNITY)
Admission: EM | Admit: 2015-03-03 | Discharge: 2015-03-03 | Disposition: A | Discharge status: Home / Self Care | Payer: BC Managed Care – PPO | Attending: Emergency Medicine | Admitting: Emergency Medicine

## 2015-03-03 ENCOUNTER — Emergency Department (HOSPITAL_COMMUNITY): Payer: BC Managed Care – PPO

## 2015-03-03 DIAGNOSIS — R52 Pain, unspecified: Secondary | ICD-10-CM | POA: Diagnosis present

## 2015-03-03 DIAGNOSIS — K2970 Gastritis, unspecified, without bleeding: Secondary | ICD-10-CM

## 2015-03-03 DIAGNOSIS — R05 Cough: Secondary | ICD-10-CM

## 2015-03-03 DIAGNOSIS — R059 Cough, unspecified: Secondary | ICD-10-CM

## 2015-03-03 DIAGNOSIS — Z79899 Other long term (current) drug therapy: Secondary | ICD-10-CM | POA: Insufficient documentation

## 2015-03-03 LAB — URINALYSIS, ROUTINE W REFLEX MICROSCOPIC
Bilirubin Urine: NEGATIVE
Glucose, UA: NEGATIVE mg/dL
Ketones, ur: NEGATIVE mg/dL
Nitrite: NEGATIVE
Protein, ur: NEGATIVE mg/dL
Specific Gravity, Urine: 1.021 (ref 1.005–1.030)
Urobilinogen, UA: 0.2 mg/dL (ref 0.0–1.0)
pH: 6 (ref 5.0–8.0)

## 2015-03-03 LAB — CBC
HCT: 39.8 % (ref 36.0–46.0)
Hemoglobin: 13.3 g/dL (ref 12.0–15.0)
MCH: 30.4 pg (ref 26.0–34.0)
MCHC: 33.4 g/dL (ref 30.0–36.0)
MCV: 91.1 fL (ref 78.0–100.0)
Platelets: 216 10*3/uL (ref 150–400)
RBC: 4.37 MIL/uL (ref 3.87–5.11)
RDW: 13.3 % (ref 11.5–15.5)
WBC: 9.8 10*3/uL (ref 4.0–10.5)

## 2015-03-03 LAB — I-STAT TROPONIN, ED: Troponin i, poc: 0 ng/mL (ref 0.00–0.08)

## 2015-03-03 LAB — COMPREHENSIVE METABOLIC PANEL
ALT: 14 U/L (ref 14–54)
AST: 27 U/L (ref 15–41)
Albumin: 3.5 g/dL (ref 3.5–5.0)
Alkaline Phosphatase: 90 U/L (ref 38–126)
Anion gap: 10 (ref 5–15)
BUN: 15 mg/dL (ref 6–20)
CO2: 24 mmol/L (ref 22–32)
Calcium: 8.5 mg/dL — ABNORMAL LOW (ref 8.9–10.3)
Chloride: 104 mmol/L (ref 101–111)
Creatinine, Ser: 0.91 mg/dL (ref 0.44–1.00)
GFR calc Af Amer: >60 mL/min (ref >60)
GFR calc non Af Amer: >60 mL/min (ref >60)
Glucose, Bld: 79 mg/dL (ref 65–99)
Potassium: 3.9 mmol/L (ref 3.5–5.1)
Sodium: 138 mmol/L (ref 135–145)
Total Bilirubin: 0.6 mg/dL (ref 0.3–1.2)
Total Protein: 7.4 g/dL (ref 6.5–8.1)

## 2015-03-03 LAB — URINE MICROSCOPIC-ADD ON

## 2015-03-03 LAB — LIPASE, BLOOD: Lipase: 19 U/L — ABNORMAL LOW (ref 22–51)

## 2015-03-03 MED ORDER — ONDANSETRON HCL 4 MG PO TABS
4.0000 mg | ORAL_TABLET | Freq: Once | ORAL | Status: AC
  Administered 2015-03-03: 4 mg via ORAL
  Filled 2015-03-03: qty 1

## 2015-03-03 MED ORDER — ONDANSETRON 4 MG PO TBDP: 4.0000 mg | ORAL_TABLET | Freq: Three times a day (TID) | ORAL | Status: DC | PRN

## 2015-03-03 MED ORDER — PANTOPRAZOLE SODIUM 40 MG PO TBEC
40.0000 mg | DELAYED_RELEASE_TABLET | Freq: Once | ORAL | Status: AC
  Administered 2015-03-03: 40 mg via ORAL
  Filled 2015-03-03: qty 1

## 2015-03-03 NOTE — ED Notes (Signed)
Pt ambulating independently w/ steady gait on d/c in no acute distress, A&Ox4. D/c instructions reviewed w/ pt and family - pt and family deny any further questions or concerns at present. Rx given x1  

## 2015-03-03 NOTE — ED Notes (Signed)
Pt here for aching all over with abd pain and nausea.

## 2015-03-03 NOTE — ED Provider Notes (Signed)
CSN: [id number]     Arrival date & time 03/03/15  1355 History   First MD Initiated Contact with Patient 03/03/15 1707     Chief Complaint  Patient presents with  . Generalized Body Aches  . Nausea  . Abdominal Pain     (Consider location/radiation/quality/duration/timing/severity/associated sxs/prior Treatment) Patient is a 49 y.o. female presenting with abdominal pain.  Abdominal Pain Pain location:  Epigastric and periumbilical Pain quality: aching   Pain radiates to:  Does not radiate Pain severity:  Moderate Onset quality:  Gradual Duration:  3 days Timing:  Intermittent (constant today, was there intermittently other days) Progression:  Worsening Chronicity:  New Relieved by:  Vomiting Worsened by:  Nothing tried Ineffective treatments:  None tried Associated symptoms: anorexia (low appetite), chills, constipation, nausea and vomiting (small amount just on arrival to ED)   Associated symptoms: no chest pain, no cough, no diarrhea, no dysuria, no fever, no hematemesis, no hematuria, no melena, no shortness of breath, no sore throat, no vaginal bleeding and no vaginal discharge   Risk factors comment:  PE   Past Medical History  Diagnosis Date  . Pulmonary embolism, blood-clot, obstetric 2002    on birth control-pulm blood clot-treated with coumadin-no problems now   Past Surgical History  Procedure Laterality Date  . No past surgeries    . Tubal ligation  04/21/2011    Procedure: ESSURE TUBAL STERILIZATION;  Surgeon: Agnes Lawrence, MD;  Location: [facility];  Service: Gynecology;  Laterality: Bilateral;   attempted essure,removal of IUD, hysteroscopy,         dilatation and currettage  . Iud removal  OCT 2012  . Laparoscopic tubal ligation  07/14/2011    Procedure: LAPAROSCOPIC TUBAL LIGATION;  Surgeon: Agnes Lawrence, MD;  Location: [facility];  Service: Gynecology;  Laterality: N/A;   Family History  Problem Relation Age of Onset  . Diabetes Mother   .  Diabetes Father   . Diabetes Sister    History  Substance Use Topics  . Smoking status: Never Smoker   . Smokeless tobacco: Never Used  . Alcohol Use: No   OB History    No data available     Review of Systems  Constitutional: Positive for chills. Negative for fever.  HENT: Negative for sore throat.   Eyes: Negative for visual disturbance.  Respiratory: Negative for cough and shortness of breath.   Cardiovascular: Negative for chest pain.  Gastrointestinal: Positive for nausea, vomiting (small amount just on arrival to ED), abdominal pain, constipation and anorexia (low appetite). Negative for diarrhea, melena and hematemesis.  Genitourinary: Negative for dysuria, hematuria, vaginal bleeding, vaginal discharge and difficulty urinating.  Musculoskeletal: Negative for back pain and neck pain.  Skin: Negative for rash.  Neurological: Negative for syncope and headaches.      Allergies  Sulfa antibiotics  Home Medications   Prior to Admission medications   Medication Sig Start Date End Date Taking? Authorizing Provider  naproxen sodium (ANAPROX) 220 MG tablet Take 220 mg by mouth 2 (two) times daily as needed (for pain).   Yes Historical Provider, MD  rivaroxaban (XARELTO) 20 MG TABS tablet Take 1 tablet (20 mg total) by mouth daily. Patient taking differently: Take 20 mg by mouth daily with supper.  11/12/14  Yes Truitt Merle, MD  clindamycin (CLEOCIN) 300 MG capsule Take 1 capsule (300 mg total) by mouth 4 (four) times daily. X 7 days 11/28/14   Debby Freiberg, MD  ondansetron (ZOFRAN ODT) 4 MG  disintegrating tablet Take 1 tablet (4 mg total) by mouth every 8 (eight) hours as needed for nausea or vomiting. 03/03/15   Gareth Morgan, MD   BP 105/40 mmHg  Pulse 63  Temp(Src) 98.7 F (37.1 C)  Resp 17  SpO2 96% Physical Exam  Constitutional: She is oriented to person, place, and time. She appears well-developed and well-nourished. No distress.  HENT:  Head: Normocephalic and  atraumatic.  Eyes: Conjunctivae and EOM are normal.  Neck: Normal range of motion.  Cardiovascular: Normal rate, regular rhythm, normal heart sounds and intact distal pulses.  Exam reveals no gallop and no friction rub.   No murmur heard. Pulmonary/Chest: Effort normal and breath sounds normal. No respiratory distress. She has no wheezes. She has no rales.  Abdominal: Soft. She exhibits no distension. There is tenderness in the epigastric area and periumbilical area. There is no guarding, no CVA tenderness, no tenderness at McBurney's point and negative Murphy's sign.  Musculoskeletal: She exhibits no edema or tenderness.  Neurological: She is alert and oriented to person, place, and time.  Skin: Skin is warm and dry. No rash noted. She is not diaphoretic. No erythema.  Nursing note and vitals reviewed.   ED Course  Procedures (including critical care time) Labs Review Labs Reviewed  LIPASE, BLOOD - Abnormal; Notable for the following:    Lipase 19 (*)    All other components within normal limits  COMPREHENSIVE METABOLIC PANEL - Abnormal; Notable for the following:    Calcium 8.5 (*)    All other components within normal limits  URINALYSIS, ROUTINE W REFLEX MICROSCOPIC (NOT AT [facility]) - Abnormal; Notable for the following:    APPearance CLOUDY (*)    Hgb urine dipstick LARGE (*)    Leukocytes, UA SMALL (*)    All other components within normal limits  CBC  URINE MICROSCOPIC-ADD ON  Randolm Idol, ED    Imaging Review Dg Chest 2 View  03/03/2015   CLINICAL DATA:  Body aches, abdominal pain, and weakness for 3 days.  EXAM: CHEST  2 VIEW  COMPARISON:  11/19/2012.  No significant change from priors.  FINDINGS: The heart size and mediastinal contours are within normal limits. Both lungs are clear. The visualized skeletal structures are unremarkable.  IMPRESSION: No active cardiopulmonary disease.   Electronically Signed   By: Staci Righter M.D.   On: 03/03/2015 15:10     EKG  Interpretation   Date/Time:  Wednesday March 03 2015 18:37:32 EDT Ventricular Rate:  57 PR Interval:  152 QRS Duration: 80 QT Interval:  418 QTC Calculation: 407 R Axis:   64 Text Interpretation:  Sinus rhythm Low voltage, precordial leads No  significant change since last tracing Confirmed by [facility] MD, [place]  ([id number]) on 03/04/2015 12:54:58 AM      MDM   Final diagnoses:  Gastritis  Body aches    49 year old female with a history of PE on Xarelto presents with concern of epigastric abdominal pain and body aches. DDx includes appendicitis, pancreatitis, cholecystitis, pyelonephritis, nephrolithiasis, diverticulitis, PID, ovarian torsion, ectopic pregnancy, and tuboovarian abscess.  Patient does not have any lower abdominal pain or tenderness and have low suspicion for pelvic etiology of pain, diverticulitis or appendicitis. She has a negative Murphy sign, no right upper quadrant tenderness and have low suspicion for cholecystitis.  Have low suspicion for appendicitis given no right lower quadrant tenderness. Patient does exhibit hematuria however does not have any flank pain to suggest nephrolithiasis.  Given nausea, ordered EKG  and single troponin which showed no sign of acute ischemia. CXR ordered in triage evaluated by myself and radiology WNL.   CMP, lipase and CBC within normal limits. Given patient's diffuse body aches, epigastric pain she most likely has viral syndrome and gastritis.  Discussed reasons to return to the ED in detail.  Gave protonix and zofran, able to tolerate po.  Gave rx for zofran. Patient discharged in stable condition with understanding of reasons to return.    Gareth Morgan, MD 03/04/15 [phone]

## 2015-03-05 ENCOUNTER — Emergency Department (HOSPITAL_COMMUNITY)
Admission: EM | Admit: 2015-03-05 | Discharge: 2015-03-05 | Disposition: A | Discharge status: Home / Self Care | Payer: BC Managed Care – PPO | Attending: Emergency Medicine | Admitting: Emergency Medicine

## 2015-03-05 ENCOUNTER — Encounter (HOSPITAL_COMMUNITY): Payer: Self-pay | Admitting: Emergency Medicine

## 2015-03-05 ENCOUNTER — Emergency Department (HOSPITAL_COMMUNITY): Payer: BC Managed Care – PPO

## 2015-03-05 DIAGNOSIS — R1033 Periumbilical pain: Secondary | ICD-10-CM | POA: Diagnosis present

## 2015-03-05 DIAGNOSIS — R1013 Epigastric pain: Secondary | ICD-10-CM | POA: Insufficient documentation

## 2015-03-05 DIAGNOSIS — Z86711 Personal history of pulmonary embolism: Secondary | ICD-10-CM | POA: Diagnosis not present

## 2015-03-05 DIAGNOSIS — Z9851 Tubal ligation status: Secondary | ICD-10-CM | POA: Insufficient documentation

## 2015-03-05 DIAGNOSIS — R109 Unspecified abdominal pain: Secondary | ICD-10-CM

## 2015-03-05 DIAGNOSIS — R319 Hematuria, unspecified: Secondary | ICD-10-CM | POA: Insufficient documentation

## 2015-03-05 DIAGNOSIS — R110 Nausea: Secondary | ICD-10-CM | POA: Insufficient documentation

## 2015-03-05 LAB — COMPREHENSIVE METABOLIC PANEL
ALT: 12 U/L — ABNORMAL LOW (ref 14–54)
AST: 20 U/L (ref 15–41)
Albumin: 3.2 g/dL — ABNORMAL LOW (ref 3.5–5.0)
Alkaline Phosphatase: 75 U/L (ref 38–126)
Anion gap: 7 (ref 5–15)
BUN: 15 mg/dL (ref 6–20)
CO2: 25 mmol/L (ref 22–32)
Calcium: 8.4 mg/dL — ABNORMAL LOW (ref 8.9–10.3)
Chloride: 106 mmol/L (ref 101–111)
Creatinine, Ser: 1.32 mg/dL — ABNORMAL HIGH (ref 0.44–1.00)
GFR calc Af Amer: 53 mL/min — ABNORMAL LOW (ref >60)
GFR calc non Af Amer: 46 mL/min — ABNORMAL LOW (ref >60)
Glucose, Bld: 111 mg/dL — ABNORMAL HIGH (ref 65–99)
Potassium: 4 mmol/L (ref 3.5–5.1)
Sodium: 138 mmol/L (ref 135–145)
Total Bilirubin: 0.4 mg/dL (ref 0.3–1.2)
Total Protein: 7 g/dL (ref 6.5–8.1)

## 2015-03-05 LAB — URINALYSIS, ROUTINE W REFLEX MICROSCOPIC
Bilirubin Urine: NEGATIVE
Glucose, UA: NEGATIVE mg/dL
Ketones, ur: NEGATIVE mg/dL
Leukocytes, UA: NEGATIVE
Nitrite: NEGATIVE
Protein, ur: NEGATIVE mg/dL
Specific Gravity, Urine: 1.018 (ref 1.005–1.030)
Urobilinogen, UA: 1 mg/dL (ref 0.0–1.0)
pH: 6.5 (ref 5.0–8.0)

## 2015-03-05 LAB — CBC
HCT: 35.1 % — ABNORMAL LOW (ref 36.0–46.0)
Hemoglobin: 11.8 g/dL — ABNORMAL LOW (ref 12.0–15.0)
MCH: 30.4 pg (ref 26.0–34.0)
MCHC: 33.6 g/dL (ref 30.0–36.0)
MCV: 90.5 fL (ref 78.0–100.0)
Platelets: 245 10*3/uL (ref 150–400)
RBC: 3.88 MIL/uL (ref 3.87–5.11)
RDW: 13.5 % (ref 11.5–15.5)
WBC: 8.5 10*3/uL (ref 4.0–10.5)

## 2015-03-05 LAB — URINE MICROSCOPIC-ADD ON

## 2015-03-05 LAB — LIPASE, BLOOD: Lipase: 31 U/L (ref 22–51)

## 2015-03-05 MED ORDER — ACETAMINOPHEN 500 MG PO TABS
1000.0000 mg | ORAL_TABLET | Freq: Once | ORAL | Status: AC
  Administered 2015-03-05: 1000 mg via ORAL
  Filled 2015-03-05: qty 2

## 2015-03-05 MED ORDER — ONDANSETRON 4 MG PO TBDP: 4.0000 mg | ORAL_TABLET | Freq: Three times a day (TID) | ORAL | Status: DC | PRN

## 2015-03-05 MED ORDER — TRAMADOL HCL 50 MG PO TABS: 50.0000 mg | ORAL_TABLET | Freq: Four times a day (QID) | ORAL | Status: DC | PRN

## 2015-03-05 MED ORDER — ONDANSETRON HCL 4 MG/2ML IJ SOLN
4.0000 mg | Freq: Once | INTRAMUSCULAR | Status: AC
  Administered 2015-03-05: 4 mg via INTRAVENOUS
  Filled 2015-03-05: qty 2

## 2015-03-05 MED ORDER — MAGNESIUM CITRATE PO SOLN
1.0000 | Freq: Once | ORAL | Status: AC
  Administered 2015-03-05: 1 via ORAL
  Filled 2015-03-05: qty 296

## 2015-03-05 MED ORDER — SODIUM CHLORIDE 0.9 % IV BOLUS (SEPSIS)
1000.0000 mL | Freq: Once | INTRAVENOUS | Status: AC
  Administered 2015-03-05: 1000 mL via INTRAVENOUS

## 2015-03-05 NOTE — ED Provider Notes (Signed)
CSN: [id number]     Arrival date & time 03/05/15  0233 History   First MD Initiated Contact with Patient 03/05/15 0401     Chief Complaint  Patient presents with  . Abdominal Pain     (Consider location/radiation/quality/duration/timing/severity/associated sxs/prior Treatment) HPI Comments: The pt is a 49 y/o fem Hx of 1 week of constipation - no BM in > 5 days No hx of constipation Had normal colonoscopy 1 month ago States she has had periumbilical discomfort X 1 week - constant, nothing makes better or worse, nausea is present but no vomiting, no diarrhea and no blood in stools.  She is straining but not having a BM.  Was seen yesterday - had mild hematuria (on NOAC for PE).  She has no CP, no back pain, no swelling, no rashes.  Some Myalgias.  No f/c.  Patient is a 49 y.o. female presenting with abdominal pain. The history is provided by the patient.  Abdominal Pain   Past Medical History  Diagnosis Date  . Pulmonary embolism, blood-clot, obstetric 2002    on birth control-pulm blood clot-treated with coumadin-no problems now   Past Surgical History  Procedure Laterality Date  . No past surgeries    . Tubal ligation  04/21/2011    Procedure: ESSURE TUBAL STERILIZATION;  Surgeon: Agnes Lawrence, MD;  Location: [facility];  Service: Gynecology;  Laterality: Bilateral;   attempted essure,removal of IUD, hysteroscopy,         dilatation and currettage  . Iud removal  OCT 2012  . Laparoscopic tubal ligation  07/14/2011    Procedure: LAPAROSCOPIC TUBAL LIGATION;  Surgeon: Agnes Lawrence, MD;  Location: [facility];  Service: Gynecology;  Laterality: N/A;   Family History  Problem Relation Age of Onset  . Diabetes Mother   . Diabetes Father   . Diabetes Sister    History  Substance Use Topics  . Smoking status: Never Smoker   . Smokeless tobacco: Never Used  . Alcohol Use: No   OB History    No data available     Review of Systems  Gastrointestinal: Positive for  abdominal pain.  All other systems reviewed and are negative.     Allergies  Sulfa antibiotics  Home Medications   Prior to Admission medications   Medication Sig Start Date End Date Taking? Authorizing Provider  naproxen sodium (ANAPROX) 220 MG tablet Take 220 mg by mouth 2 (two) times daily as needed (for pain).   Yes Historical Provider, MD  rivaroxaban (XARELTO) 20 MG TABS tablet Take 1 tablet (20 mg total) by mouth daily. Patient taking differently: Take 20 mg by mouth daily with supper.  11/12/14  Yes Truitt Merle, MD  ondansetron (ZOFRAN ODT) 4 MG disintegrating tablet Take 1 tablet (4 mg total) by mouth every 8 (eight) hours as needed for nausea. 03/05/15   Noemi Chapel, MD  traMADol (ULTRAM) 50 MG tablet Take 1 tablet (50 mg total) by mouth every 6 (six) hours as needed. 03/05/15   Noemi Chapel, MD   BP 116/61 mmHg  Pulse 74  Temp(Src) 98.4 F (36.9 C) (Oral)  Resp 20  SpO2 99% Physical Exam  Constitutional: She appears well-developed and well-nourished. No distress.  HENT:  Head: Normocephalic and atraumatic.  Mouth/Throat: Oropharynx is clear and moist. No oropharyngeal exudate.  Eyes: Conjunctivae and EOM are normal. Pupils are equal, round, and reactive to light. Right eye exhibits no discharge. Left eye exhibits no discharge. No scleral icterus.  Neck: Normal  range of motion. Neck supple. No JVD present. No thyromegaly present.  Cardiovascular: Normal rate, regular rhythm, normal heart sounds and intact distal pulses.  Exam reveals no gallop and no friction rub.   No murmur heard. Pulmonary/Chest: Effort normal and breath sounds normal. No respiratory distress. She has no wheezes. She has no rales.  Abdominal: Soft. Bowel sounds are normal. She exhibits no distension and no mass. There is tenderness.  Very soft abdomen without guarding,masses or focal ttp - she has increased ttp over the periumbilical area and epigastrium, no lower abd ttp.  No masses  Musculoskeletal:  Normal range of motion. She exhibits no edema or tenderness.  Lymphadenopathy:    She has no cervical adenopathy.  Neurological: She is alert. Coordination normal.  Skin: Skin is warm and dry. No rash noted. No erythema.  Psychiatric: She has a normal mood and affect. Her behavior is normal.  Nursing note and vitals reviewed.   ED Course  Procedures (including critical care time) Labs Review Labs Reviewed  COMPREHENSIVE METABOLIC PANEL - Abnormal; Notable for the following:    Glucose, Bld 111 (*)    Creatinine, Ser 1.32 (*)    Calcium 8.4 (*)    Albumin 3.2 (*)    ALT 12 (*)    GFR calc non Af Amer 46 (*)    GFR calc Af Amer 53 (*)    All other components within normal limits  CBC - Abnormal; Notable for the following:    Hemoglobin 11.8 (*)    HCT 35.1 (*)    All other components within normal limits  URINALYSIS, ROUTINE W REFLEX MICROSCOPIC (NOT AT [facility]) - Abnormal; Notable for the following:    Hgb urine dipstick MODERATE (*)    All other components within normal limits  URINE MICROSCOPIC-ADD ON - Abnormal; Notable for the following:    Squamous Epithelial / LPF MANY (*)    All other components within normal limits  LIPASE, BLOOD    Imaging Review Dg Chest 2 View  03/03/2015   CLINICAL DATA:  Body aches, abdominal pain, and weakness for 3 days.  EXAM: CHEST  2 VIEW  COMPARISON:  11/19/2012.  No significant change from priors.  FINDINGS: The heart size and mediastinal contours are within normal limits. Both lungs are clear. The visualized skeletal structures are unremarkable.  IMPRESSION: No active cardiopulmonary disease.   Electronically Signed   By: Staci Righter M.D.   On: 03/03/2015 15:10   Dg Abd 2 Views  03/05/2015   CLINICAL DATA:  Central abdominal pain, nausea and vomiting for 5 days.  EXAM: ABDOMEN - 2 VIEW  COMPARISON:  None.  FINDINGS: The abdominal gas pattern is negative for obstruction or perforation. Stool and air is present throughout the colon. No biliary  or urinary calculi are evident.  IMPRESSION: Negative.   Electronically Signed   By: Andreas Newport M.D.   On: 03/05/2015 06:07      MDM   Final diagnoses:  Abdominal pain    Ongoing abd pain - labs show slight elevation in Cr c/w prior - she had thorough w/u shy of imaging 2 days ago without etiology -   The xrays are negative - the pt does not appear overly constipated - the VS are normal and labs are unremarkable - she will be given a copy of her results and can f/u closely outpt.  Meds given in ED:  Medications  sodium chloride 0.9 % bolus 1,000 mL (1,000 mLs Intravenous New  Bag/Given 03/05/15 0501)  magnesium citrate solution 1 Bottle (1 Bottle Oral Given 03/05/15 0501)  ondansetron (ZOFRAN) injection 4 mg (4 mg Intravenous Given 03/05/15 0501)  acetaminophen (TYLENOL) tablet 1,000 mg (1,000 mg Oral Given 03/05/15 0554)    New Prescriptions   ONDANSETRON (ZOFRAN ODT) 4 MG DISINTEGRATING TABLET    Take 1 tablet (4 mg total) by mouth every 8 (eight) hours as needed for nausea.   TRAMADOL (ULTRAM) 50 MG TABLET    Take 1 tablet (50 mg total) by mouth every 6 (six) hours as needed.      Noemi Chapel, MD 03/05/15 [phone]

## 2015-03-05 NOTE — Discharge Instructions (Signed)

## 2015-03-05 NOTE — ED Notes (Signed)
Pt able to ambulate in hall, in room, and dress self independently.

## 2015-03-05 NOTE — ED Notes (Signed)
MD made aware of edits to urinalysis made.

## 2015-03-05 NOTE — ED Notes (Signed)
Pt. reports persistent low abdominal pain with nausea and constipation onset last week . Denies fever or chills.

## 2015-03-05 NOTE — ED Notes (Signed)
Pt ambulated in hall with EMT.  Gait steady and even, but pt hunched over due to pain.

## 2015-09-06 ENCOUNTER — Other Ambulatory Visit: Payer: Self-pay | Admitting: Hematology

## 2015-09-08 ENCOUNTER — Other Ambulatory Visit: Payer: Self-pay | Admitting: *Deleted

## 2015-09-08 ENCOUNTER — Telehealth: Payer: Self-pay | Admitting: Hematology

## 2015-09-08 NOTE — Telephone Encounter (Signed)
per pof to sch pt appt-cld pt and left message of time & date of r/s appt per dr Burr Medico

## 2015-10-07 ENCOUNTER — Ambulatory Visit (HOSPITAL_BASED_OUTPATIENT_CLINIC_OR_DEPARTMENT_OTHER): Payer: BC Managed Care – PPO | Admitting: Hematology

## 2015-10-07 ENCOUNTER — Other Ambulatory Visit: Payer: Self-pay | Admitting: Hematology

## 2015-10-07 ENCOUNTER — Encounter: Payer: Self-pay | Admitting: Hematology

## 2015-10-07 ENCOUNTER — Telehealth: Payer: Self-pay | Admitting: Hematology

## 2015-10-07 ENCOUNTER — Other Ambulatory Visit (HOSPITAL_BASED_OUTPATIENT_CLINIC_OR_DEPARTMENT_OTHER): Payer: BC Managed Care – PPO

## 2015-10-07 VITALS — BP 117/50 | HR 71 | Temp 98.1°F | Resp 18 | Ht 68.0 in | Wt 221.0 lb

## 2015-10-07 DIAGNOSIS — I2699 Other pulmonary embolism without acute cor pulmonale: Secondary | ICD-10-CM

## 2015-10-07 DIAGNOSIS — E669 Obesity, unspecified: Secondary | ICD-10-CM

## 2015-10-07 DIAGNOSIS — I82401 Acute embolism and thrombosis of unspecified deep veins of right lower extremity: Secondary | ICD-10-CM

## 2015-10-07 DIAGNOSIS — R739 Hyperglycemia, unspecified: Secondary | ICD-10-CM | POA: Diagnosis not present

## 2015-10-07 LAB — COMPREHENSIVE METABOLIC PANEL
ALT: 13 U/L (ref 0–55)
AST: 17 U/L (ref 5–34)
Albumin: 3.3 g/dL — ABNORMAL LOW (ref 3.5–5.0)
Alkaline Phosphatase: 98 U/L (ref 40–150)
Anion Gap: 7 mEq/L (ref 3–11)
BUN: 12.6 mg/dL (ref 7.0–26.0)
CO2: 25 mEq/L (ref 22–29)
Calcium: 8.1 mg/dL — ABNORMAL LOW (ref 8.4–10.4)
Chloride: 107 mEq/L (ref 98–109)
Creatinine: 0.8 mg/dL (ref 0.6–1.1)
EGFR: >90 mL/min/{1.73_m2} (ref >90)
Glucose: 74 mg/dl (ref 70–140)
Potassium: 3.8 mEq/L (ref 3.5–5.1)
Sodium: 139 mEq/L (ref 136–145)
Total Bilirubin: <0.3 mg/dL (ref 0.20–1.20)
Total Protein: 7.3 g/dL (ref 6.4–8.3)

## 2015-10-07 LAB — CBC WITH DIFFERENTIAL/PLATELET
BASO%: 0.9 % (ref 0.0–2.0)
Basophils Absolute: 0.1 10*3/uL (ref 0.0–0.1)
EOS%: 1.3 % (ref 0.0–7.0)
Eosinophils Absolute: 0.1 10*3/uL (ref 0.0–0.5)
HCT: 37.3 % (ref 34.8–46.6)
HGB: 11.9 g/dL (ref 11.6–15.9)
LYMPH%: 27.6 % (ref 14.0–49.7)
MCH: 29.1 pg (ref 25.1–34.0)
MCHC: 31.9 g/dL (ref 31.5–36.0)
MCV: 91 fL (ref 79.5–101.0)
MONO#: 0.6 10*3/uL (ref 0.1–0.9)
MONO%: 10.2 % (ref 0.0–14.0)
NEUT#: 3.7 10*3/uL (ref 1.5–6.5)
NEUT%: 60 % (ref 38.4–76.8)
Platelets: 210 10*3/uL (ref 145–400)
RBC: 4.1 10*6/uL (ref 3.70–5.45)
RDW: 14.3 % (ref 11.2–14.5)
WBC: 6.2 10*3/uL (ref 3.9–10.3)
lymph#: 1.7 10*3/uL (ref 0.9–3.3)

## 2015-10-07 MED ORDER — RIVAROXABAN 20 MG PO TABS: ORAL_TABLET | ORAL | Status: DC

## 2015-10-07 NOTE — Progress Notes (Signed)
Bianca [facility]  Telephone:([phone]) [phone] Fax:([phone]) [phone]  Clinic Follow up Note   Patient Care Team: Lucianne Lei, MD as PCP - General (Family Medicine) 10/07/2015  DIAGNOSIS:  1. Pulmonary embolism, acute bilateral with high clot burden in association with an asymptomatic DVT of the right lower leg  diagnosed on 06/07/2012. She was treated with Xarelto (rivaroxaban). Doppler study of the right leg carried out on 08/20/2012 and CT angiogram of the chest carried out on 08/23/2012 showed resolution of previous blood clots. Hypercoagulable workup carried out on 06/08/2012 was negative  2. DVT involving the right lower extremity, asymptomatic at presentation with clots seen in the right distal popliteal, peroneal, and posterior tibial veins on Doppler study from 06/08/2012.  3. History of pulmonary embolism in association with birth control pills in 2001.   INTERVAL HISTORY: Bianca Simmons returns for follow up. She has restarted Xarelto 4 months ago after I saw her last time, and has been compliant with it. She tolerates very well, denies any noticeable side effects. She also denies any signs of bleeding or easy bruising. She is doing well overall. She has been trying to lose some weight, actually gained some weight since her last visit. She is a [employer] examiner, has quite a bit of stress from her work, does not exercise regularly. Her primary care physician recently started her on diabetic medication.  REVIEW OF SYSTEMS:   Constitutional: Denies fevers, chills or abnormal weight loss, (+) mild fatigue Eyes: Denies blurriness of vision Ears, nose, mouth, throat, and face: Denies mucositis or sore throat Respiratory: Denies cough, dyspnea or wheezes Cardiovascular: Denies palpitation, chest discomfort or lower extremity swelling Gastrointestinal:  Denies nausea, heartburn or change in bowel habits Skin: Denies abnormal skin rashes Lymphatics: Denies new lymphadenopathy or easy  bruising Neurological:Denies numbness, tingling or new weaknesses Behavioral/Psych: Mood is stable, no new changes  All other systems were reviewed with the patient and are negative.  MEDICAL HISTORY:  Past Medical History  Diagnosis Date  . Pulmonary embolism, blood-clot, obstetric 2002    on birth control-pulm blood clot-treated with coumadin-no problems now    SURGICAL HISTORY: Past Surgical History  Procedure Laterality Date  . No past surgeries    . Tubal ligation  04/21/2011    Procedure: ESSURE TUBAL STERILIZATION;  Surgeon: Agnes Lawrence, MD;  Location: [facility];  Service: Gynecology;  Laterality: Bilateral;   attempted essure,removal of IUD, hysteroscopy,         dilatation and currettage  . Iud removal  OCT 2012  . Laparoscopic tubal ligation  07/14/2011    Procedure: LAPAROSCOPIC TUBAL LIGATION;  Surgeon: Agnes Lawrence, MD;  Location: [facility];  Service: Gynecology;  Laterality: N/A;    I have reviewed the social history and family history with the patient and they are unchanged from previous note.  ALLERGIES:  is allergic to sulfa antibiotics.  MEDICATIONS:  Current Outpatient Prescriptions  Medication Sig Dispense Refill  . JANUMET XR [phone] MG TB24 Take 1 tablet by mouth daily.    Alveda Reasons 20 MG TABS tablet TAKE 1 TABLET (20 MG TOTAL) BY MOUTH DAILY. 30 tablet 0  . naproxen sodium (ANAPROX) 220 MG tablet Take 220 mg by mouth 2 (two) times daily as needed (for pain). Reported on 10/07/2015    . ondansetron (ZOFRAN ODT) 4 MG disintegrating tablet Take 1 tablet (4 mg total) by mouth every 8 (eight) hours as needed for nausea. (Patient not taking: Reported on 10/07/2015) 10 tablet 0  .  traMADol (ULTRAM) 50 MG tablet Take 1 tablet (50 mg total) by mouth every 6 (six) hours as needed. (Patient not taking: Reported on 10/07/2015) 15 tablet 0   No current facility-administered medications for this visit.    PHYSICAL EXAMINATION: ECOG PERFORMANCE STATUS: 0 -  Asymptomatic  Filed Vitals:   10/07/15 1325  BP: 117/50  Pulse: 71  Temp: 98.1 F (36.7 C)  Resp: 18   Filed Weights   10/07/15 1325  Weight: 221 lb (100.245 kg)    GENERAL:alert, no distress and comfortable SKIN: skin color, texture, turgor are normal, no rashes or significant lesions EYES: normal, Conjunctiva are pink and non-injected, sclera clear OROPHARYNX:no exudate, no erythema and lips, buccal mucosa, and tongue normal  NECK: supple, thyroid normal size, non-tender, without nodularity LYMPH:  no palpable lymphadenopathy in the cervical, axillary or inguinal LUNGS: clear to auscultation and percussion with normal breathing effort HEART: regular rate & rhythm and no murmurs and no lower extremity edema ABDOMEN:abdomen soft, non-tender and normal bowel sounds Musculoskeletal:no cyanosis of digits and no clubbing  NEURO: alert & oriented x 3 with fluent speech, no focal motor/sensory deficits  LABORATORY DATA:  I have reviewed the data as listed CBC Latest Ref Rng 10/07/2015 03/05/2015 03/03/2015  WBC 3.9 - 10.3 10e3/uL 6.2 8.5 9.8  Hemoglobin 11.6 - 15.9 g/dL 11.9 11.8(L) 13.3  Hematocrit 34.8 - 46.6 % 37.3 35.1(L) 39.8  Platelets 145 - 400 10e3/uL 210 245 216     CMP Latest Ref Rng 03/05/2015 03/03/2015 11/12/2014  Glucose 65 - 99 mg/dL 111(H) 79 80  BUN 6 - 20 mg/dL 15 15 11.4  Creatinine 0.44 - 1.00 mg/dL 1.32(H) 0.91 0.8  Sodium 135 - 145 mmol/L 138 138 142  Potassium 3.5 - 5.1 mmol/L 4.0 3.9 4.0  Chloride 101 - 111 mmol/L 106 104 -  CO2 22 - 32 mmol/L 25 24 29   Calcium 8.9 - 10.3 mg/dL 8.4(L) 8.5(L) 8.5  Total Protein 6.5 - 8.1 g/dL 7.0 7.4 7.0  Total Bilirubin 0.3 - 1.2 mg/dL 0.4 0.6 0.39  Alkaline Phos 38 - 126 U/L 75 90 95  AST 15 - 41 U/L 20 27 14   ALT 14 - 54 U/L 12(L) 14 12      RADIOGRAPHIC STUDIES: I have personally reviewed the radiological images as listed and agreed with the findings in the report. No results found.   ASSESSMENT & PLAN:    50 year old female, with history of one episode of PE when she was on birth control pill, and second episode of unprovoked PE and DVT. She lost to follow-up subsequently.  1. Recurrent PE and DVT -She had a negative hypercoagulation workup -Giving the recurrent pulmonary and reason, especially second episode was unprovoked, young age, and low risk of bleeding, I recommend her to restart prophylactic anticoagulation indefinitely. She agrees. -She tolerating Xarelto very well, we'll continue. -We also discussed other risk modifying strategies to reduce her chance of thrombosis, such as being physical active, use compression stocks on long distance travel, avoid birth control pill, avoid smoking, etc. she voiced good understanding. I strongly encouraged her to exercise regularly, and try to lose some weight.  -She preferred to follow-up with [place] for her anticoagulation management.  2. Obesity, hyperglycemia  -she recently started on Janumet by her PCP  -I encouraged her eat healthy, and exercise regularly.  Follow-up -I refilled her Xarelto today -I'll see her back in 6 months  All questions were answered. The patient knows to call the clinic  with any problems, questions or concerns. No barriers to learning was detected.  I spent 15 minutes counseling the patient face to face. The total time spent in the appointment was 20 minutes and more than 50% was on counseling and review of test results     Truitt Merle, MD 10/07/2015 1:50 PM

## 2015-10-07 NOTE — Telephone Encounter (Signed)
Gave and printed appt sched and avs for pt for sept °

## 2016-02-04 DIAGNOSIS — E1169 Type 2 diabetes mellitus with other specified complication: Secondary | ICD-10-CM | POA: Diagnosis present

## 2016-04-10 ENCOUNTER — Ambulatory Visit: Payer: BC Managed Care – PPO | Admitting: Hematology

## 2016-04-10 ENCOUNTER — Other Ambulatory Visit: Payer: BC Managed Care – PPO

## 2016-04-11 ENCOUNTER — Ambulatory Visit: Payer: BC Managed Care – PPO | Admitting: Hematology

## 2016-04-11 ENCOUNTER — Other Ambulatory Visit: Payer: BC Managed Care – PPO

## 2016-04-19 ENCOUNTER — Ambulatory Visit (HOSPITAL_BASED_OUTPATIENT_CLINIC_OR_DEPARTMENT_OTHER): Payer: BC Managed Care – PPO | Admitting: Hematology

## 2016-04-19 ENCOUNTER — Telehealth: Payer: Self-pay | Admitting: Hematology

## 2016-04-19 ENCOUNTER — Other Ambulatory Visit (HOSPITAL_BASED_OUTPATIENT_CLINIC_OR_DEPARTMENT_OTHER): Payer: BC Managed Care – PPO

## 2016-04-19 ENCOUNTER — Encounter: Payer: Self-pay | Admitting: Hematology

## 2016-04-19 DIAGNOSIS — Z86711 Personal history of pulmonary embolism: Secondary | ICD-10-CM

## 2016-04-19 DIAGNOSIS — R739 Hyperglycemia, unspecified: Secondary | ICD-10-CM | POA: Diagnosis not present

## 2016-04-19 DIAGNOSIS — Z86718 Personal history of other venous thrombosis and embolism: Secondary | ICD-10-CM

## 2016-04-19 DIAGNOSIS — I2699 Other pulmonary embolism without acute cor pulmonale: Secondary | ICD-10-CM

## 2016-04-19 DIAGNOSIS — E669 Obesity, unspecified: Secondary | ICD-10-CM

## 2016-04-19 HISTORY — DX: Personal history of other venous thrombosis and embolism: Z86.718

## 2016-04-19 LAB — CBC WITH DIFFERENTIAL/PLATELET
BASO%: 0.3 % (ref 0.0–2.0)
Basophils Absolute: 0 10*3/uL (ref 0.0–0.1)
EOS%: 0.9 % (ref 0.0–7.0)
Eosinophils Absolute: 0 10*3/uL (ref 0.0–0.5)
HCT: 37.9 % (ref 34.8–46.6)
HGB: 12.4 g/dL (ref 11.6–15.9)
LYMPH%: 22.2 % (ref 14.0–49.7)
MCH: 29.1 pg (ref 25.1–34.0)
MCHC: 32.8 g/dL (ref 31.5–36.0)
MCV: 88.6 fL (ref 79.5–101.0)
MONO#: 0.5 10*3/uL (ref 0.1–0.9)
MONO%: 10.6 % (ref 0.0–14.0)
NEUT#: 3.1 10*3/uL (ref 1.5–6.5)
NEUT%: 66 % (ref 38.4–76.8)
Platelets: 211 10*3/uL (ref 145–400)
RBC: 4.27 10*6/uL (ref 3.70–5.45)
RDW: 13.9 % (ref 11.2–14.5)
WBC: 4.8 10*3/uL (ref 3.9–10.3)
lymph#: 1.1 10*3/uL (ref 0.9–3.3)

## 2016-04-19 LAB — COMPREHENSIVE METABOLIC PANEL
ALT: 18 U/L (ref 0–55)
AST: 18 U/L (ref 5–34)
Albumin: 3.2 g/dL — ABNORMAL LOW (ref 3.5–5.0)
Alkaline Phosphatase: 107 U/L (ref 40–150)
Anion Gap: 8 mEq/L (ref 3–11)
BUN: 11.6 mg/dL (ref 7.0–26.0)
CO2: 21 mEq/L — ABNORMAL LOW (ref 22–29)
Calcium: 8.7 mg/dL (ref 8.4–10.4)
Chloride: 108 mEq/L (ref 98–109)
Creatinine: 0.9 mg/dL (ref 0.6–1.1)
EGFR: >90 mL/min/{1.73_m2} (ref >90)
Glucose: 113 mg/dl (ref 70–140)
Potassium: 3.8 mEq/L (ref 3.5–5.1)
Sodium: 138 mEq/L (ref 136–145)
Total Bilirubin: 0.38 mg/dL (ref 0.20–1.20)
Total Protein: 7.5 g/dL (ref 6.4–8.3)

## 2016-04-19 MED ORDER — RIVAROXABAN 20 MG PO TABS: ORAL_TABLET | ORAL | 5 refills | Status: DC

## 2016-04-19 NOTE — Progress Notes (Signed)
Bianca Simmons  [phone] [phone]  Clinic Follow up Note   Patient Care Team: Lucianne Lei, MD as PCP - General (Family Medicine) 04/19/2016  DIAGNOSIS:  1. Pulmonary embolism, acute bilateral with high clot burden in association with an asymptomatic DVT of the right lower leg  diagnosed on 06/07/2012. She was treated with Xarelto (rivaroxaban). Doppler study of the right leg carried out on 08/20/2012 and CT angiogram of the chest carried out on 08/23/2012 showed resolution of previous blood clots. Hypercoagulable workup carried out on 06/08/2012 was negative  2. DVT involving the right lower extremity, asymptomatic at presentation with clots seen in the right distal popliteal, peroneal, and posterior tibial veins on Doppler study from 06/08/2012.  3. History of pulmonary embolism in association with birth control pills in 2001.   INTERVAL HISTORY: Reality returns for follow up. She has been compliant with Xarelto, and tolerating very well. She denies any episode of bleeding or bruising. She feels well, with good energy and appetite, no complaints. Her father passed away from leukemia last month.   REVIEW OF SYSTEMS:   Constitutional: Denies fevers, chills or abnormal weight loss, (+) mild fatigue Eyes: Denies blurriness of vision Ears, nose, mouth, throat, and face: Denies mucositis or sore throat Respiratory: Denies cough, dyspnea or wheezes Cardiovascular: Denies palpitation, chest discomfort or lower extremity swelling Gastrointestinal:  Denies nausea, heartburn or change in bowel habits Skin: Denies abnormal skin rashes Lymphatics: Denies new lymphadenopathy or easy bruising Neurological:Denies numbness, tingling or new weaknesses Behavioral/Psych: Mood is stable, no new changes  All other systems were reviewed with the patient and are negative.  MEDICAL HISTORY:  Past Medical History:  Diagnosis Date  . Pulmonary embolism, blood-clot,  obstetric 2002   on birth control-pulm blood clot-treated with coumadin-no problems now    SURGICAL HISTORY: Past Surgical History:  Procedure Laterality Date  . IUD REMOVAL  OCT 2012  . LAPAROSCOPIC TUBAL LIGATION  07/14/2011   Procedure: LAPAROSCOPIC TUBAL LIGATION;  Surgeon: Agnes Lawrence, MD;  Location: [facility];  Service: Gynecology;  Laterality: N/A;  . NO PAST SURGERIES    . TUBAL LIGATION  04/21/2011   Procedure: ESSURE TUBAL STERILIZATION;  Surgeon: Agnes Lawrence, MD;  Location: [facility];  Service: Gynecology;  Laterality: Bilateral;   attempted essure,removal of IUD, hysteroscopy,         dilatation and currettage    I have reviewed the social history and family history with the patient and they are unchanged from previous note.  ALLERGIES:  is allergic to sulfa antibiotics.  MEDICATIONS:  Current Outpatient Prescriptions  Medication Sig Dispense Refill  . JANUMET XR [phone] MG TB24 Take 1 tablet by mouth daily.    . naproxen sodium (ANAPROX) 220 MG tablet Take 220 mg by mouth 2 (two) times daily as needed (for pain). Reported on 10/07/2015    . rivaroxaban (XARELTO) 20 MG TABS tablet TAKE 1 TABLET (20 MG TOTAL) BY MOUTH DAILY. 30 tablet 5   No current facility-administered medications for this visit.     PHYSICAL EXAMINATION: ECOG PERFORMANCE STATUS: 0 - Asymptomatic  Vitals:   04/19/16 0915  BP: (!) 128/52  Pulse: 80  Resp: 18  Temp: 98.8 F (37.1 C)   Filed Weights   04/19/16 0915  Weight: 221 lb 14.4 oz (100.7 kg)    GENERAL:alert, no distress and comfortable SKIN: skin color, texture, turgor are normal, no rashes or significant lesions EYES: normal, Conjunctiva are pink and non-injected, sclera clear  OROPHARYNX:no exudate, no erythema and lips, buccal mucosa, and tongue normal  NECK: supple, thyroid normal size, non-tender, without nodularity LYMPH:  no palpable lymphadenopathy in the cervical, axillary or inguinal LUNGS: clear to auscultation  and percussion with normal breathing effort HEART: regular rate & rhythm and no murmurs and no lower extremity edema ABDOMEN:abdomen soft, non-tender and normal bowel sounds Musculoskeletal:no cyanosis of digits and no clubbing  NEURO: alert & oriented x 3 with fluent speech, no focal motor/sensory deficits  LABORATORY DATA:  I have reviewed the data as listed CBC Latest Ref Rng & Units 04/19/2016 10/07/2015 03/05/2015  WBC 3.9 - 10.3 10e3/uL 4.8 6.2 8.5  Hemoglobin 11.6 - 15.9 g/dL 12.4 11.9 11.8(L)  Hematocrit 34.8 - 46.6 % 37.9 37.3 35.1(L)  Platelets 145 - 400 10e3/uL 211 210 245     CMP Latest Ref Rng & Units 10/07/2015 03/05/2015 03/03/2015  Glucose 70 - 140 mg/dl 74 111(H) 79  BUN 7.0 - 26.0 mg/dL 12.6 15 15   Creatinine 0.6 - 1.1 mg/dL 0.8 1.32(H) 0.91  Sodium 136 - 145 mEq/L 139 138 138  Potassium 3.5 - 5.1 mEq/L 3.8 4.0 3.9  Chloride 101 - 111 mmol/L - 106 104  CO2 22 - 29 mEq/L 25 25 24   Calcium 8.4 - 10.4 mg/dL 8.1(L) 8.4(L) 8.5(L)  Total Protein 6.4 - 8.3 g/dL 7.3 7.0 7.4  Total Bilirubin 0.20 - 1.20 mg/dL <0.30 0.4 0.6  Alkaline Phos 40 - 150 U/L 98 75 90  AST 5 - 34 U/L 17 20 27   ALT 0 - 55 U/L 13 12(L) 14      RADIOGRAPHIC STUDIES: I have personally reviewed the radiological images as listed and agreed with the findings in the report. No results found.   ASSESSMENT & PLAN:  50 year-old female, with history of one episode of PE when she was on birth control pill, and second episode of unprovoked PE and DVT. She lost to follow-up subsequently.  1. Recurrent PE and DVT -She had a negative hypercoagulation workup -Giving the recurrent pulmonary and reason, especially second episode was unprovoked, young age, and low risk of bleeding, I recommend her to restart prophylactic anticoagulation indefinitely. She agrees. -She tolerating Xarelto very well, we'll continue. -We also discussed other risk modifying strategies to reduce her chance of thrombosis, such as being physical  active, use compression stocks on long distance travel, avoid birth control pill, avoid smoking, etc. she voiced good understanding. I strongly encouraged her to exercise regularly, and try to lose some weight.  -She preferred to follow-up with [place] for her anticoagulation management.  2. Obesity, hyperglycemia  -she recently started on Janumet by her PCP  -I encouraged her eat healthy, and exercise regularly.  Follow-up -I refilled her Xarelto today -I'll see her back in 6 months with lab   All questions were answered. The patient knows to call the clinic with any problems, questions or concerns. No barriers to learning was detected.  I spent 15 minutes counseling the patient face to face. The total time spent in the appointment was 20 minutes and more than 50% was on counseling and review of test results     Truitt Merle, MD 04/19/16 9:24 AM

## 2016-04-19 NOTE — Telephone Encounter (Signed)
Gave patient avs report and appointments March  °

## 2016-05-16 ENCOUNTER — Other Ambulatory Visit
Admission: RE | Admit: 2016-05-16 | Discharge: 2016-05-16 | Disposition: A | Discharge status: Home / Self Care | Payer: BC Managed Care – PPO | Source: Ambulatory Visit | Attending: Internal Medicine | Admitting: Internal Medicine

## 2016-05-16 DIAGNOSIS — R079 Chest pain, unspecified: Secondary | ICD-10-CM | POA: Diagnosis present

## 2016-05-16 LAB — FIBRIN DERIVATIVES D-DIMER (ARMC ONLY): Fibrin derivatives D-dimer (ARMC): 1549 — ABNORMAL HIGH (ref 0–499)

## 2016-05-17 ENCOUNTER — Telehealth: Payer: Self-pay | Admitting: *Deleted

## 2016-05-17 ENCOUNTER — Encounter: Payer: Self-pay | Admitting: *Deleted

## 2016-05-17 ENCOUNTER — Emergency Department
Admission: EM | Admit: 2016-05-17 | Discharge: 2016-05-17 | Disposition: A | Discharge status: Home / Self Care | Payer: BC Managed Care – PPO | Attending: Student in an Organized Health Care Education/Training Program | Admitting: Student in an Organized Health Care Education/Training Program

## 2016-05-17 ENCOUNTER — Ambulatory Visit
Admission: RE | Admit: 2016-05-17 | Discharge: 2016-05-17 | Disposition: A | Discharge status: Home / Self Care | Payer: BC Managed Care – PPO | Source: Ambulatory Visit | Attending: Physician Assistant | Admitting: Physician Assistant

## 2016-05-17 ENCOUNTER — Other Ambulatory Visit: Payer: Self-pay | Admitting: Physician Assistant

## 2016-05-17 DIAGNOSIS — R7989 Other specified abnormal findings of blood chemistry: Secondary | ICD-10-CM

## 2016-05-17 DIAGNOSIS — R0602 Shortness of breath: Secondary | ICD-10-CM

## 2016-05-17 DIAGNOSIS — I2699 Other pulmonary embolism without acute cor pulmonale: Secondary | ICD-10-CM

## 2016-05-17 DIAGNOSIS — Z79899 Other long term (current) drug therapy: Secondary | ICD-10-CM | POA: Insufficient documentation

## 2016-05-17 LAB — BASIC METABOLIC PANEL
Anion gap: 8 (ref 5–15)
BUN: 10 mg/dL (ref 6–20)
CO2: 22 mmol/L (ref 22–32)
Calcium: 8.7 mg/dL — ABNORMAL LOW (ref 8.9–10.3)
Chloride: 107 mmol/L (ref 101–111)
Creatinine, Ser: 0.69 mg/dL (ref 0.44–1.00)
GFR calc Af Amer: >60 mL/min (ref >60)
GFR calc non Af Amer: >60 mL/min (ref >60)
Glucose, Bld: 102 mg/dL — ABNORMAL HIGH (ref 65–99)
Potassium: 3.9 mmol/L (ref 3.5–5.1)
Sodium: 137 mmol/L (ref 135–145)

## 2016-05-17 LAB — PROTIME-INR
INR: 1.79
Prothrombin Time: 21 seconds — ABNORMAL HIGH (ref 11.4–15.2)

## 2016-05-17 LAB — CBC
HCT: 37.5 % (ref 35.0–47.0)
Hemoglobin: 12.7 g/dL (ref 12.0–16.0)
MCH: 29.9 pg (ref 26.0–34.0)
MCHC: 33.8 g/dL (ref 32.0–36.0)
MCV: 88.6 fL (ref 80.0–100.0)
Platelets: 228 10*3/uL (ref 150–440)
RBC: 4.24 MIL/uL (ref 3.80–5.20)
RDW: 13.8 % (ref 11.5–14.5)
WBC: 7.9 10*3/uL (ref 3.6–11.0)

## 2016-05-17 LAB — TROPONIN I: Troponin I: <0.03 ng/mL (ref <0.03)

## 2016-05-17 LAB — BRAIN NATRIURETIC PEPTIDE: B Natriuretic Peptide: 69 pg/mL (ref 0.0–100.0)

## 2016-05-17 MED ORDER — IOPAMIDOL (ISOVUE-370) INJECTION 76%
75.0000 mL | Freq: Once | INTRAVENOUS | Status: AC | PRN
  Administered 2016-05-17: 75 mL via INTRAVENOUS

## 2016-05-17 MED ORDER — RIVAROXABAN 15 MG PO TABS
15.0000 mg | ORAL_TABLET | Freq: Once | ORAL | Status: AC
  Administered 2016-05-17: 15 mg via ORAL
  Filled 2016-05-17: qty 1

## 2016-05-17 MED ORDER — RIVAROXABAN 15 MG PO TABS: 15.0000 mg | ORAL_TABLET | Freq: Two times a day (BID) | ORAL | 0 refills | Status: DC

## 2016-05-17 NOTE — Telephone Encounter (Addendum)
Patient called and stated that she was currently in the ER @ [facility]. Patient said that she has a blood clot on her lung, pt. Stated that they weren't going to admit her and that she wasn't ok with that. Message was sent to Dr.Feng. Pt also left message on RN vmail

## 2016-05-17 NOTE — ED Notes (Signed)
MD in room to assess patient.   

## 2016-05-17 NOTE — Telephone Encounter (Signed)
Dr. Quentin Cornwall at [facility] ED just call me, and told me that she stopped Xarelto 3 weeks ago. She is entirely stable, does not need to be admitted. So I agree with restarting Xarelto at 15 mg twice daily for 3 weeks, then changed to 20 mg daily. She does not have to see me this week, unless she wants to.  Truitt Merle MD

## 2016-05-17 NOTE — Discharge Instructions (Signed)
Stop taking your 20mg  Xarelto.  Start taking Xarelto 15mg  twice daily for the next three weeks.  You can then return to regular daily Xarelto.  Return for worsening shortness of breath, chest pain, weakness.

## 2016-05-17 NOTE — Telephone Encounter (Signed)
I called back and left a VM: OK to increase Xarelto to 15 mg twice daily, or switches I'll talk to Lovenox injection. If she wants to see me, I can see her on 10/20 morning, I sent a scheduling message.  Truitt Merle MD  05/17/2016

## 2016-05-17 NOTE — ED Provider Notes (Signed)
[facility] Emergency Department Provider Note    First MD Initiated Contact with Patient 05/17/16 1216     (approximate)  I have reviewed the triage vital signs and the nursing notes.   HISTORY  Chief Complaint Shortness of Breath    HPI Bianca Simmons is a 50 y.o. female for evaluation of bilateral PE. Patient with a history of pulmonary embolism on Xarelto for the past 5 years. Patient states that over the past 3 weeks she stopped taking her Xarelto after the loss of her father. States that she did not have any SI or intent for self-harm. States that she simply was busy and did not take her medications with her. She currently denies any active shortness of breath or chest pain. Does feel short of breath when she ambulates a long distance. No recent fevers.   Past Medical History:  Diagnosis Date  . Pulmonary embolism, blood-clot, obstetric 2002   on birth control-pulm blood clot-treated with coumadin-no problems now    Patient Active Problem List   Diagnosis Date Noted  . Personal history of venous thrombosis and embolism 04/19/2016  . Abnormal weight gain 08/29/2012  . DVT (deep venous thrombosis) ([place]) 06/08/2012  . SOB (shortness of breath) 06/07/2012  . Pulmonary embolism, bilateral ([place]) 06/07/2012  . Pulmonary embolism 10 yrs ago 06/07/2012  . Abnormal uterine bleeding 07/14/2011  . Contraceptive management 04/21/2011    Past Surgical History:  Procedure Laterality Date  . IUD REMOVAL  OCT 2012  . LAPAROSCOPIC TUBAL LIGATION  07/14/2011   Procedure: LAPAROSCOPIC TUBAL LIGATION;  Surgeon: Agnes Lawrence, MD;  Location: [facility];  Service: Gynecology;  Laterality: N/A;  . NO PAST SURGERIES    . TUBAL LIGATION  04/21/2011   Procedure: ESSURE TUBAL STERILIZATION;  Surgeon: Agnes Lawrence, MD;  Location: [facility];  Service: Gynecology;  Laterality: Bilateral;   attempted essure,removal of IUD, hysteroscopy,         dilatation and  currettage    Prior to Admission medications   Medication Sig Start Date End Date Taking? Authorizing Provider  rivaroxaban (XARELTO) 20 MG TABS tablet TAKE 1 TABLET (20 MG TOTAL) BY MOUTH DAILY. 04/19/16  Yes Truitt Merle, MD  naproxen sodium (ANAPROX) 220 MG tablet Take 220 mg by mouth 2 (two) times daily as needed (for pain). Reported on 10/07/2015    Historical Provider, MD  Rivaroxaban (XARELTO) 15 MG TABS tablet Take 1 tablet (15 mg total) by mouth 2 (two) times daily with a meal. 05/17/16 06/07/16  Merlyn Lot, MD    Allergies Sulfa antibiotics  Family History  Problem Relation Age of Onset  . Diabetes Mother   . Diabetes Father   . Diabetes Sister     Social History Social History  Substance Use Topics  . Smoking status: Never Smoker  . Smokeless tobacco: Never Used  . Alcohol use No    Review of Systems Patient denies headaches, rhinorrhea, blurry vision, numbness, shortness of breath, chest pain, edema, cough, abdominal pain, nausea, vomiting, diarrhea, dysuria, fevers, rashes or hallucinations unless otherwise stated above in HPI. ____________________________________________   PHYSICAL EXAM:  VITAL SIGNS: Vitals:   05/17/16 1407 05/17/16 1430  BP: 129/75 124/67  Pulse: 72 63  Resp: 16 15  Temp:      Constitutional: Alert and oriented. Well appearing and in no acute distress. Eyes: Conjunctivae are normal. PERRL. EOMI. Head: Atraumatic. Nose: No congestion/rhinnorhea. Mouth/Throat: Mucous membranes are moist.  Oropharynx non-erythematous. Neck: No stridor. Painless  ROM. No cervical spine tenderness to palpation Hematological/Lymphatic/Immunilogical: No cervical lymphadenopathy. Cardiovascular: Normal rate, regular rhythm. Grossly normal heart sounds.  Good peripheral circulation. Respiratory: Normal respiratory effort.  No retractions. Lungs CTAB. Gastrointestinal: Soft and nontender. No distention. No abdominal bruits. No CVA tenderness. Genitourinary:    Musculoskeletal: No lower extremity tenderness nor edema.  No joint effusions. Neurologic:  Normal speech and language. No gross focal neurologic deficits are appreciated. No gait instability. Skin:  Skin is warm, dry and intact. No rash noted. Psychiatric: Mood and affect are normal. Speech and behavior are normal.  ____________________________________________   LABS (all labs ordered are listed, but only abnormal results are displayed)  Results for orders placed or performed during the hospital encounter of 05/17/16 (from the past 24 hour(s))  Basic metabolic panel     Status: Abnormal   Collection Time: 05/17/16 12:00 PM  Result Value Ref Range   Sodium 137 135 - 145 mmol/L   Potassium 3.9 3.5 - 5.1 mmol/L   Chloride 107 101 - 111 mmol/L   CO2 22 22 - 32 mmol/L   Glucose, Bld 102 (H) 65 - 99 mg/dL   BUN 10 6 - 20 mg/dL   Creatinine, Ser 0.69 0.44 - 1.00 mg/dL   Calcium 8.7 (L) 8.9 - 10.3 mg/dL   GFR calc non Af Amer >60 >60 mL/min   GFR calc Af Amer >60 >60 mL/min   Anion gap 8 5 - 15  CBC     Status: None   Collection Time: 05/17/16 12:00 PM  Result Value Ref Range   WBC 7.9 3.6 - 11.0 K/uL   RBC 4.24 3.80 - 5.20 MIL/uL   Hemoglobin 12.7 12.0 - 16.0 g/dL   HCT 37.5 35.0 - 47.0 %   MCV 88.6 80.0 - 100.0 fL   MCH 29.9 26.0 - 34.0 pg   MCHC 33.8 32.0 - 36.0 g/dL   RDW 13.8 11.5 - 14.5 %   Platelets 228 150 - 440 K/uL  Protime-INR (order if Patient is taking Coumadin / Warfarin)     Status: Abnormal   Collection Time: 05/17/16 12:00 PM  Result Value Ref Range   Prothrombin Time 21.0 (H) 11.4 - 15.2 seconds   INR 1.79   Troponin I     Status: None   Collection Time: 05/17/16 12:00 PM  Result Value Ref Range   Troponin I <0.03 <0.03 ng/mL  Brain natriuretic peptide     Status: None   Collection Time: 05/17/16 12:00 PM  Result Value Ref Range   B Natriuretic Peptide 69.0 0.0 - 100.0 pg/mL    ____________________________________________  ____________________________________________  RADIOLOGY  I personally reviewed all radiographic images ordered to evaluate for the above acute complaints and reviewed radiology reports and findings.  These findings were personally discussed with the patient.  Please see medical record for radiology report.  ____________________________________________   PROCEDURES  Procedure(s) performed: none    Critical Care performed: no ____________________________________________   INITIAL IMPRESSION / ASSESSMENT AND PLAN / ED COURSE  Pertinent labs & imaging results that were available during my care of the patient were reviewed by me and considered in my medical decision making (see chart for details).  DDX: Pulmonary embolism, bronchitis, asthma, pneumonia, dehydration, ACS  Laylianna Lave is a 50 y.o. who presents to the ED with evidence of recurrent bilateral pulmonary embolism most likely secondary to noncompliance. The patient is currently hemodynamically stable without any hypoxia or tachypnea. Her exam is reassuring. We will check troponin  and BMP to further risk stratify. CT imaging does not show any evidence of occlusive pulmonary embolism or right heart strain.  The patient will be placed on continuous pulse oximetry and telemetry for monitoring.  Laboratory evaluation will be sent to evaluate for the above complaints.     Clinical Course  Comment By Time  Patient reassessed. Remains hemodynamic stable. No hypoxia. BNP and troponin are reassuring. Blood work is otherwise reassuring. Based on review of presentation, CT chest and [place] cardiothoracic guidelines patient will be restarted on twice a day xarelto  for 3 weeks. Patient has follow-up with hematology in [place].  I spoke with Dr. Burr Medico (patient's hematologist) who agrees to see patient in follow up.  Have discussed with the patient and available family all diagnostics and  treatments performed thus far and all questions were answered to the best of my ability. The patient demonstrates understanding and agreement with plan.  Merlyn Lot, MD 10/18 1341     ____________________________________________   FINAL CLINICAL IMPRESSION(S) / ED DIAGNOSES  Final diagnoses:  Other acute pulmonary embolism without acute cor pulmonale (HCC)      NEW MEDICATIONS STARTED DURING THIS VISIT:  Discharge Medication List as of 05/17/2016  1:45 PM    START taking these medications   Details  !! Rivaroxaban (XARELTO) 15 MG TABS tablet Take 1 tablet (15 mg total) by mouth 2 (two) times daily with a meal., Starting Wed 05/17/2016, Until Wed 06/07/2016, Print     !! - Potential duplicate medications found. Please discuss with provider.       Note:  This document was prepared using Dragon voice recognition software and may include unintentional dictation errors.    Merlyn Lot, MD 05/17/16 [phone]

## 2016-05-17 NOTE — ED Triage Notes (Signed)
States she has been feeling SOB recently, had CT angio done today and was told she had bilat PE, pt already on xarelto but states she has missed several doses, pt awake and alert

## 2016-05-18 ENCOUNTER — Telehealth: Payer: Self-pay | Admitting: Hematology

## 2016-05-18 NOTE — Telephone Encounter (Signed)
Please call her back and I can see her at 8:30am or 9am tomorrow, no lab needed.   Truitt Merle MD

## 2016-05-18 NOTE — Telephone Encounter (Signed)
"  I called yesterday about an appointment.  I do want to be seen tomorrow and plan to take of work.  The doctor in the ED said she would see me tomorrow.  I have started Xarelto.  Return number [phone]."   Advised she not take the day off until an appointment is confirmed.  Apologized for any miscommunication.

## 2016-05-18 NOTE — Telephone Encounter (Signed)
Received call from patient trying to schedule a hospital follow up appointment with the MD. The patient stated that she has called several times and left voicemail messages hasn't received a response yet.

## 2016-05-18 NOTE — Telephone Encounter (Signed)
Patient called to speak with nurse about an earlier appointment. Would like to see MD this week if possible.

## 2016-05-18 NOTE — Telephone Encounter (Signed)
Called patient who agrees to come in at 8:30 tomorrow.  Scheduling message sent.

## 2016-05-19 ENCOUNTER — Telehealth: Payer: Self-pay | Admitting: Hematology

## 2016-05-19 ENCOUNTER — Ambulatory Visit (HOSPITAL_BASED_OUTPATIENT_CLINIC_OR_DEPARTMENT_OTHER): Payer: BC Managed Care – PPO | Admitting: Hematology

## 2016-05-19 ENCOUNTER — Ambulatory Visit (HOSPITAL_BASED_OUTPATIENT_CLINIC_OR_DEPARTMENT_OTHER): Payer: BC Managed Care – PPO

## 2016-05-19 VITALS — BP 110/55 | HR 72 | Temp 98.6°F | Resp 17 | Ht 68.0 in | Wt 217.8 lb

## 2016-05-19 DIAGNOSIS — E669 Obesity, unspecified: Secondary | ICD-10-CM

## 2016-05-19 DIAGNOSIS — R739 Hyperglycemia, unspecified: Secondary | ICD-10-CM

## 2016-05-19 DIAGNOSIS — I2699 Other pulmonary embolism without acute cor pulmonale: Secondary | ICD-10-CM

## 2016-05-19 DIAGNOSIS — Z86718 Personal history of other venous thrombosis and embolism: Secondary | ICD-10-CM

## 2016-05-19 LAB — PROTIME-INR
INR: 2.5 (ref 2.00–3.50)
Protime: 30 Seconds — ABNORMAL HIGH (ref 10.6–13.4)

## 2016-05-19 LAB — CBC WITH DIFFERENTIAL/PLATELET
BASO%: 0.3 % (ref 0.0–2.0)
Basophils Absolute: 0 10*3/uL (ref 0.0–0.1)
EOS%: 1.1 % (ref 0.0–7.0)
Eosinophils Absolute: 0.1 10*3/uL (ref 0.0–0.5)
HCT: 36.3 % (ref 34.8–46.6)
HGB: 11.9 g/dL (ref 11.6–15.9)
LYMPH%: 29.3 % (ref 14.0–49.7)
MCH: 29.1 pg (ref 25.1–34.0)
MCHC: 32.8 g/dL (ref 31.5–36.0)
MCV: 88.8 fL (ref 79.5–101.0)
MONO#: 0.5 10*3/uL (ref 0.1–0.9)
MONO%: 7.9 % (ref 0.0–14.0)
NEUT#: 3.8 10*3/uL (ref 1.5–6.5)
NEUT%: 61.4 % (ref 38.4–76.8)
Platelets: 249 10*3/uL (ref 145–400)
RBC: 4.09 10*6/uL (ref 3.70–5.45)
RDW: 13.1 % (ref 11.2–14.5)
WBC: 6.2 10*3/uL (ref 3.9–10.3)
lymph#: 1.8 10*3/uL (ref 0.9–3.3)

## 2016-05-19 MED ORDER — ENOXAPARIN SODIUM 100 MG/ML ~~LOC~~ SOLN: 1.0000 mg/kg | Freq: Two times a day (BID) | SUBCUTANEOUS | 0 refills | Status: DC

## 2016-05-19 MED ORDER — WARFARIN SODIUM 5 MG PO TABS: 5.0000 mg | ORAL_TABLET | Freq: Every day | ORAL | 0 refills | Status: DC

## 2016-05-19 NOTE — Telephone Encounter (Signed)
Lab added on for today. Patient sent to lab. Follow up and lab scheduled per 05/19/16 los. AVS report and appointment schedule given to patient, per 05/19/16 los.

## 2016-05-19 NOTE — Progress Notes (Signed)
Bianca Simmons  [phone] [phone]  Clinic Follow up Note   Patient Care Team: Lucianne Lei, MD as PCP - General (Family Medicine) 05/19/2016  DIAGNOSIS:  1. Pulmonary embolism, acute bilateral with high clot burden in association with an asymptomatic DVT of the right lower leg  diagnosed on 06/07/2012. She was treated with Xarelto (rivaroxaban). Doppler study of the right leg carried out on 08/20/2012 and CT angiogram of the chest carried out on 08/23/2012 showed resolution of previous blood clots. Hypercoagulable workup carried out on 06/08/2012 was negative  2. DVT involving the right lower extremity, asymptomatic at presentation with clots seen in the right distal popliteal, peroneal, and posterior tibial veins on Doppler study from 06/08/2012.  3. History of pulmonary embolism in association with birth control pills in 2001.  4. Unprovoked bilateral PE on 05/18/2016  INTERVAL HISTORY: Tress returns for an urgent follow up. She was seen at [facility] emergency room for bilateral PE 2 days ago. She states she has been compliant with Xarelto, except a few months ago she did not take for a few weeks when her father died. She developed slightly worsening of dyspnea on exertion, fatigue, in the past 2 weeks, she was seen in the emergency room 2 days ago, and the CT chest reviewed bilateral nonocclusive pulmonary emboli involving the pulmonary artery branches to bilateral lobes. I spoke with ED physician, she was done hemodialysis stable, and she was discharged home with increased dose of Xarelto to 15 and went twice daily. She states she is still short of breath on exertion, chest pain has improved, no fever, productive cough or other new complaints.  REVIEW OF SYSTEMS:   Constitutional: Denies fevers, chills or abnormal weight loss, (+) mild fatigue Eyes: Denies blurriness of vision Ears, nose, mouth, throat, and face: Denies mucositis or sore  throat Respiratory: see HPI  Cardiovascular: Denies palpitation, chest discomfort or lower extremity swelling Gastrointestinal:  Denies nausea, heartburn or change in bowel habits Skin: Denies abnormal skin rashes Lymphatics: Denies new lymphadenopathy or easy bruising Neurological:Denies numbness, tingling or new weaknesses Behavioral/Psych: Mood is stable, no new changes  All other systems were reviewed with the patient and are negative.  MEDICAL HISTORY:  Past Medical History:  Diagnosis Date  . Pulmonary embolism, blood-clot, obstetric 2002   on birth control-pulm blood clot-treated with coumadin-no problems now    SURGICAL HISTORY: Past Surgical History:  Procedure Laterality Date  . IUD REMOVAL  OCT 2012  . LAPAROSCOPIC TUBAL LIGATION  07/14/2011   Procedure: LAPAROSCOPIC TUBAL LIGATION;  Surgeon: Agnes Lawrence, MD;  Location: [facility];  Service: Gynecology;  Laterality: N/A;  . NO PAST SURGERIES    . TUBAL LIGATION  04/21/2011   Procedure: ESSURE TUBAL STERILIZATION;  Surgeon: Agnes Lawrence, MD;  Location: [facility];  Service: Gynecology;  Laterality: Bilateral;   attempted essure,removal of IUD, hysteroscopy,         dilatation and currettage    I have reviewed the social history and family history with the patient and they are unchanged from previous note.  ALLERGIES:  is allergic to sulfa antibiotics.  MEDICATIONS:  Current Outpatient Prescriptions  Medication Sig Dispense Refill  . naproxen sodium (ANAPROX) 220 MG tablet Take 220 mg by mouth 2 (two) times daily as needed (for pain). Reported on 10/07/2015    . Rivaroxaban (XARELTO) 15 MG TABS tablet Take 1 tablet (15 mg total) by mouth 2 (two) times daily with a meal. 42 tablet 0  .  rivaroxaban (XARELTO) 20 MG TABS tablet TAKE 1 TABLET (20 MG TOTAL) BY MOUTH DAILY. 30 tablet 5   No current facility-administered medications for this visit.     PHYSICAL EXAMINATION: ECOG PERFORMANCE STATUS: 1  Vitals:    05/19/16 0900 05/19/16 0901  BP: (!) 129/44 (!) 110/55  Pulse: 72   Resp: 17   Temp: 98.6 F (37 C)    Filed Weights   05/19/16 0900  Weight: 217 lb 12.8 oz (98.8 kg)    GENERAL:alert, no distress and comfortable SKIN: skin color, texture, turgor are normal, no rashes or significant lesions EYES: normal, Conjunctiva are pink and non-injected, sclera clear OROPHARYNX:no exudate, no erythema and lips, buccal mucosa, and tongue normal  NECK: supple, thyroid normal size, non-tender, without nodularity LYMPH:  no palpable lymphadenopathy in the cervical, axillary or inguinal LUNGS: clear to auscultation and percussion with normal breathing effort HEART: regular rate & rhythm and no murmurs and no lower extremity edema ABDOMEN:abdomen soft, non-tender and normal bowel sounds Musculoskeletal:no cyanosis of digits and no clubbing  NEURO: alert & oriented x 3 with fluent speech, no focal motor/sensory deficits  LABORATORY DATA:  I have reviewed the data as listed CBC Latest Ref Rng & Units 05/17/2016 04/19/2016 10/07/2015  WBC 3.6 - 11.0 K/uL 7.9 4.8 6.2  Hemoglobin 12.0 - 16.0 g/dL 12.7 12.4 11.9  Hematocrit 35.0 - 47.0 % 37.5 37.9 37.3  Platelets 150 - 440 K/uL 228 211 210     CMP Latest Ref Rng & Units 05/17/2016 04/19/2016 10/07/2015  Glucose 65 - 99 mg/dL 102(H) 113 74  BUN 6 - 20 mg/dL 10 11.6 12.6  Creatinine 0.44 - 1.00 mg/dL 0.69 0.9 0.8  Sodium 135 - 145 mmol/L 137 138 139  Potassium 3.5 - 5.1 mmol/L 3.9 3.8 3.8  Chloride 101 - 111 mmol/L 107 - -  CO2 22 - 32 mmol/L 22 21(L) 25  Calcium 8.9 - 10.3 mg/dL 8.7(L) 8.7 8.1(L)  Total Protein 6.4 - 8.3 g/dL - 7.5 7.3  Total Bilirubin 0.20 - 1.20 mg/dL - 0.38 <0.30  Alkaline Phos 40 - 150 U/L - 107 98  AST 5 - 34 U/L - 18 17  ALT 0 - 55 U/L - 18 13      RADIOGRAPHIC STUDIES: I have personally reviewed the radiological images as listed and agreed with the findings in the report. Ct Angio Chest Pe W Or Wo Contrast  Result  Date: 05/17/2016 CLINICAL DATA:  Positive D-dimer, shortness of breath with exertion EXAM: CT ANGIOGRAPHY CHEST WITH CONTRAST TECHNIQUE: Multidetector CT imaging of the chest was performed using the standard protocol during bolus administration of intravenous contrast. Multiplanar CT image reconstructions and MIPs were obtained to evaluate the vascular anatomy. CONTRAST:  75 cc Isovue 370 COMPARISON:  Chest x-ray of 03/03/2015 and CT chest of 08/23/2012 FINDINGS: Cardiovascular: There are nonocclusive bilateral pulmonary emboli present primarily involving the pulmonary artery branches to the left upper lobe lingula and left lower lobe with lesser involvement of branches to the right upper lobe right middle lobe and right lower lobe. There is no present evidence of right heart strain. The heart is within normal limits in size. No pericardial effusion is seen. The mid ascending thoracic aorta measures 27 mm in diameter. Mediastinum/Nodes: No mediastinal or hilar adenopathy is seen. Only small mediastinal lymph nodes are present. The thyroid gland is unremarkable. Lungs/Pleura: On lung window images, no lung parenchymal infiltrate is seen. No pleural effusion is noted. No suspicious pulmonary nodule is  seen. The central airway is patent. Upper Abdomen: The upper abdomen that is visualized is unremarkable. Musculoskeletal: There are degenerative changes in the lower thoracic spine. Review of the MIP images confirms the above findings. IMPRESSION: 1. Nonocclusive pulmonary emboli bilaterally. No evidence of right heart strain. 2. No lung infiltrate or effusion. Electronically Signed   By: Ivar Drape M.D.   On: 05/17/2016 11:24     ASSESSMENT & PLAN:  50 year-old female, with history of one episode of PE when she was on birth control pill, and second episode of unprovoked PE and DVT. She lost to follow-up subsequently. She now presented with unprovoked PE again when she was on Xarelto  1. Recurrent PE and  DVT -She had a negative hypercoagulation workup -Giving the recurrent pulmonary and reason, especially second episode was unprovoked, young age, and low risk of bleeding, I recommend her to restart prophylactic anticoagulation indefinitely. She agrees. -She now developed unprovoked bilateral PE when she was on Xarelto 20 mg daily. She was slightly hypoxic with pulse ox 88% left 5 minutes walking, normal oxygen level at 99-100% at rest -She probably does not need home oxygen, but I recommend her to avoid exertion for the next few weeks -I recommend her to switch to Lovenox injection or Coumadin. After a lengthy discussion of the pros and cons of different anticoagulation agents, she agrees to switch to Coumadin. I'll start her on Lovenox 1 mg/kg every 12 hours, and Coumadin 5 mg daily, we'll stop Lovenox when she is therapeutic on Coumadin. -We also discussed other risk modifying strategies to reduce her chance of thrombosis, such as being physical active, use compression stocks on long distance travel, avoid birth control pill, avoid smoking, etc. she voiced good understanding. I strongly encouraged her to exercise regularly, and try to lose some weight.  -She preferred to follow-up with [place] for her anticoagulation management.  2. Obesity, hyperglycemia  -she recently started on Janumet by her PCP  -I encouraged her eat healthy, and exercise regularly.  Follow-up -lab today -she will start lovenox 100mg  q12h tonight, we called her pharmacy in her co-pay is $30 -she will start Coumadin 5 mg daily tonight -Return to clinic in 4 days, to repeat lab PT/INR and f/u to see if her hypoxia improves.  All questions were answered. The patient knows to call the clinic with any problems, questions or concerns. No barriers to learning was detected.  I spent 20 minutes counseling the patient face to face. The total time spent in the appointment was 25 minutes and more than 50% was on counseling and review of  test results     Truitt Merle, MD 05/19/16

## 2016-05-20 ENCOUNTER — Encounter: Payer: Self-pay | Admitting: Hematology

## 2016-05-23 ENCOUNTER — Emergency Department (HOSPITAL_COMMUNITY): Payer: BC Managed Care – PPO

## 2016-05-23 ENCOUNTER — Telehealth: Payer: Self-pay | Admitting: Hematology

## 2016-05-23 ENCOUNTER — Emergency Department (HOSPITAL_COMMUNITY)
Admission: EM | Admit: 2016-05-23 | Discharge: 2016-05-24 | Disposition: A | Discharge status: Home / Self Care | Payer: BC Managed Care – PPO | Attending: Emergency Medicine | Admitting: Emergency Medicine

## 2016-05-23 ENCOUNTER — Ambulatory Visit (HOSPITAL_BASED_OUTPATIENT_CLINIC_OR_DEPARTMENT_OTHER): Payer: BC Managed Care – PPO | Admitting: Nurse Practitioner

## 2016-05-23 ENCOUNTER — Other Ambulatory Visit (HOSPITAL_BASED_OUTPATIENT_CLINIC_OR_DEPARTMENT_OTHER): Payer: BC Managed Care – PPO

## 2016-05-23 ENCOUNTER — Other Ambulatory Visit: Payer: Self-pay

## 2016-05-23 ENCOUNTER — Encounter (HOSPITAL_COMMUNITY): Payer: Self-pay | Admitting: Emergency Medicine

## 2016-05-23 VITALS — BP 124/54 | HR 100 | Temp 98.5°F | Resp 20 | Ht 68.0 in | Wt 214.1 lb

## 2016-05-23 DIAGNOSIS — R079 Chest pain, unspecified: Secondary | ICD-10-CM | POA: Diagnosis not present

## 2016-05-23 DIAGNOSIS — I2699 Other pulmonary embolism without acute cor pulmonale: Secondary | ICD-10-CM

## 2016-05-23 DIAGNOSIS — Z86718 Personal history of other venous thrombosis and embolism: Secondary | ICD-10-CM

## 2016-05-23 DIAGNOSIS — R110 Nausea: Secondary | ICD-10-CM | POA: Diagnosis not present

## 2016-05-23 DIAGNOSIS — B349 Viral infection, unspecified: Secondary | ICD-10-CM | POA: Diagnosis not present

## 2016-05-23 DIAGNOSIS — Z7901 Long term (current) use of anticoagulants: Secondary | ICD-10-CM | POA: Diagnosis not present

## 2016-05-23 DIAGNOSIS — J40 Bronchitis, not specified as acute or chronic: Secondary | ICD-10-CM

## 2016-05-23 DIAGNOSIS — R739 Hyperglycemia, unspecified: Secondary | ICD-10-CM

## 2016-05-23 DIAGNOSIS — E669 Obesity, unspecified: Secondary | ICD-10-CM

## 2016-05-23 DIAGNOSIS — R0789 Other chest pain: Secondary | ICD-10-CM | POA: Diagnosis present

## 2016-05-23 LAB — CBC WITH DIFFERENTIAL/PLATELET
BASO%: 0.8 % (ref 0.0–2.0)
Basophils Absolute: 0 10*3/uL (ref 0.0–0.1)
EOS%: 1.6 % (ref 0.0–7.0)
Eosinophils Absolute: 0.1 10*3/uL (ref 0.0–0.5)
HCT: 36.4 % (ref 34.8–46.6)
HGB: 12.3 g/dL (ref 11.6–15.9)
LYMPH%: 24.7 % (ref 14.0–49.7)
MCH: 29.6 pg (ref 25.1–34.0)
MCHC: 33.8 g/dL (ref 31.5–36.0)
MCV: 87.5 fL (ref 79.5–101.0)
MONO#: 0.3 10*3/uL (ref 0.1–0.9)
MONO%: 6.3 % (ref 0.0–14.0)
NEUT#: 3.3 10*3/uL (ref 1.5–6.5)
NEUT%: 66.6 % (ref 38.4–76.8)
Platelets: 247 10*3/uL (ref 145–400)
RBC: 4.16 10*6/uL (ref 3.70–5.45)
RDW: 13.2 % (ref 11.2–14.5)
WBC: 4.9 10*3/uL (ref 3.9–10.3)
lymph#: 1.2 10*3/uL (ref 0.9–3.3)
nRBC: 0 % (ref 0–0)

## 2016-05-23 LAB — CBC
HCT: 36.6 % (ref 36.0–46.0)
Hemoglobin: 12 g/dL (ref 12.0–15.0)
MCH: 29.5 pg (ref 26.0–34.0)
MCHC: 32.8 g/dL (ref 30.0–36.0)
MCV: 89.9 fL (ref 78.0–100.0)
Platelets: 281 10*3/uL (ref 150–400)
RBC: 4.07 MIL/uL (ref 3.87–5.11)
RDW: 13.1 % (ref 11.5–15.5)
WBC: 7.4 10*3/uL (ref 4.0–10.5)

## 2016-05-23 LAB — BASIC METABOLIC PANEL
Anion gap: 9 (ref 5–15)
BUN: 13 mg/dL (ref 6–20)
CO2: 22 mmol/L (ref 22–32)
Calcium: 8.5 mg/dL — ABNORMAL LOW (ref 8.9–10.3)
Chloride: 108 mmol/L (ref 101–111)
Creatinine, Ser: 0.81 mg/dL (ref 0.44–1.00)
GFR calc Af Amer: >60 mL/min (ref >60)
GFR calc non Af Amer: >60 mL/min (ref >60)
Glucose, Bld: 90 mg/dL (ref 65–99)
Potassium: 3.5 mmol/L (ref 3.5–5.1)
Sodium: 139 mmol/L (ref 135–145)

## 2016-05-23 LAB — PROTIME-INR
INR: 1.3 — ABNORMAL LOW (ref 2.00–3.50)
INR: 1.22
Prothrombin Time: 15.5 seconds — ABNORMAL HIGH (ref 11.4–15.2)
Protime: 15.6 Seconds — ABNORMAL HIGH (ref 10.6–13.4)

## 2016-05-23 LAB — I-STAT TROPONIN, ED: Troponin i, poc: 0 ng/mL (ref 0.00–0.08)

## 2016-05-23 MED ORDER — IOPAMIDOL (ISOVUE-370) INJECTION 76%
100.0000 mL | Freq: Once | INTRAVENOUS | Status: AC | PRN
  Administered 2016-05-23: 100 mL via INTRAVENOUS

## 2016-05-23 NOTE — Telephone Encounter (Signed)
Appointments scheduled per 10/24 LOS. Patient given AVS report and calendar of future scheduled appointments. °

## 2016-05-23 NOTE — ED Triage Notes (Signed)
Pt c/o chest tightness that began this afternoon; pt states she currently has bilateral pulmonary emboli that she is taking lovenox and coumadin for; endorses nausea, denies vomiting; also c/o feeling lightheaded and states it hurts to swallow; A&Ox4

## 2016-05-23 NOTE — Discharge Instructions (Signed)
Please read and follow all provided instructions.  Your diagnoses today include:  1. Bronchitis    Tests performed today include: Vital signs. See below for your results today.   Medications prescribed:  Take as prescribed   Home care instructions:  Follow any educational materials contained in this packet.  Follow-up instructions: Please follow-up with your primary care provider for further evaluation of symptoms and treatment   Return instructions:  Please return to the Emergency Department if you do not get better, if you get worse, or new symptoms OR  - Fever (temperature greater than 101.54F)  - Bleeding that does not stop with holding pressure to the area    -Severe pain (please note that you may be more sore the day after your accident)  - Chest Pain  - Difficulty breathing  - Severe nausea or vomiting  - Inability to tolerate food and liquids  - Passing out  - Skin becoming red around your wounds  - Change in mental status (confusion or lethargy)  - New numbness or weakness    Please return if you have any other emergent concerns.  Additional Information:  Your vital signs today were: BP 112/59    Pulse 69    Temp 99 F (37.2 C) (Oral)    Resp 23    Ht 5\' 8"  (1.727 m)    Wt 96.6 kg    SpO2 95%    BMI 32.39 kg/m  If your blood pressure (BP) was elevated above 135/85 this visit, please have this repeated by your doctor within one month. ---------------

## 2016-05-23 NOTE — Progress Notes (Signed)
[facility] OFFICE PROGRESS NOTE   DIAGNOSIS:  1. Pulmonary embolism, acute bilateral with high clot burden in association with an asymptomatic DVT of the right lower leg  diagnosed on 06/07/2012. She was treated with Xarelto (rivaroxaban). Doppler study of the right leg carried out on 08/20/2012 and CT angiogram of the chest carried out on 08/23/2012 showed resolution of previous blood clots. Hypercoagulable workup carried out on 06/08/2012 was negative  2. DVT involving the right lower extremity, asymptomatic at presentation with clots seen in the right distal popliteal, peroneal, and posterior tibial veins on Doppler study from 06/08/2012.  3. History of pulmonary embolism in association with birth control pills in 2001.  4. Unprovoked bilateral PE on 05/18/2016   5. Initiation of Lovenox with transition to Coumadin beginning 05/19/2016  INTERVAL HISTORY:   Bianca Simmons returns as scheduled. She began Lovenox 100 mg every 12 hours and Coumadin 5 mg daily following an office visit 05/19/2016. She reports being "nauseated and weak" since this past Sunday. No shortness of breath. She had intermittent "shooting" chest pain yesterday, 4-5 times. She began having "heavy" vaginal bleeding 2 days ago similar to a menstrual cycle. Her last menstrual cycle was several years ago. No other bleeding. No fever.  Objective:  Vital signs in last 24 hours:  Blood pressure (!) 124/54, pulse 100, temperature 98.5 F (36.9 C), temperature source Oral, resp. rate 20, height 5\' 8"  (1.727 m), weight 214 lb 1.6 oz (97.1 kg), SpO2 99 %. (Vital signs at rest heart rate 79, respirations 20, blood pressure 124/54 and oxygen saturation 100%)    HEENT: No thrush or ulcers. Resp: Lungs clear bilaterally. Cardio: Regular rate and rhythm. GI: Abdomen is soft. Mild tenderness at the right upper quadrant. No organomegaly. Vascular: No leg edema. Calves soft and nontender.    Lab Results:  Lab  Results  Component Value Date   WBC 4.9 05/23/2016   HGB 12.3 05/23/2016   HCT 36.4 05/23/2016   MCV 87.5 05/23/2016   PLT 247 05/23/2016   NEUTROABS 3.3 05/23/2016   PT 15.6 INR 1.3  Imaging:  No results found.  Medications: I have reviewed the patient's current medications.  Assessment/Plan: 1. Recurrent PE and DVT with negative hypercoagulation workup. Given the recurrent pulmonary embolism, especially second episode was unprovoked, young age and low risk of bleeding Dr. Burr Medico has recommended prophylactic anticoagulation indefinitely. She developed unprovoked bilateral PE while on Xarelto 20 mg daily. Lovenox to Coumadin initiated 05/19/2016. 2. Obesity, hyperglycemia.   Disposition: Bianca Simmons appears stable. Coumadin is subtherapeutic after 4 doses. Dr. Burr Medico recommends increasing the Coumadin dose from 5 mg daily to 7.5 mg daily. She will continue Lovenox for the remainder of this week. She will return for a follow-up PT/INR on 05/26/2016.  The onset of vaginal bleeding is likely related to the combination of Lovenox and Coumadin. She will follow up with gynecology if the vaginal bleeding does not resolve.  Oxygen saturation remained 99-100% with ambulation. The intermittent chest pain may be related to the recent pulmonary emboli.  It is not clear why she is nauseated. There is a low incidence (3%) with Lovenox. She will contact the office if the nausea persists or she develops any new symptoms.  Bianca Simmons will return for a follow-up PT/INR 05/26/2016 and on 05/30/2016. She will return for a follow-up visit with Dr. Burr Medico on 06/06/2016. She will contact the office in the interim as outlined above or with any other problems.  Above reviewed  with Dr. Burr Medico.  Ned Card ANP/GNP-BC   05/23/2016  10:36 AM

## 2016-05-23 NOTE — ED Notes (Signed)
Sarah in lab is adding on the PT-INR

## 2016-05-23 NOTE — ED Provider Notes (Signed)
[facility] DEPT Provider Note   CSN: [id number] Arrival date & time: 05/23/16  1940  By signing my name below, I, Irene Pap, attest that this documentation has been prepared under the direction and in the presence of Shary Decamp, PA-C. Electronically Signed: Irene Pap, ED Scribe. 05/23/16. 8:44 PM.  History   Chief Complaint Chief Complaint  Patient presents with  . Chest Tightness   The history is provided by the patient. No language interpreter was used.   HPI Comments: Bianca Simmons is a 50 y.o. Female with a hx of PE and DVT who presents to the Emergency Department complaining of gradually worsening chest pressure that began this afternoon. She reports associated nausea, lightheadedness, upper abdominal pain, and pain with swallowing. Pt has been resting today and has been trying to not exert herself. She has not taken anything for her symptoms. She called her PCP and was told that her current symptoms were most likely due to the existing blood clots. She is currently on Lovenox and Coumadin for bilateral PE that was diagnosed on 05/17/16. Pt presented with chest pressure at that time, but her current symptoms are increased. Pt started the Lovenox and Coumadin on 05/19/16. She was previously on Xarelto for past blood clots. Pt reports that her sister has a hx of stroke. She denies hx of heart disease, HTN, or hx of smoking. Pt denies radiating pain, fever, chills, vomiting, cough, congestion, or rhinorrhea.  Past Medical History:  Diagnosis Date  . Pulmonary embolism, blood-clot, obstetric 2002   on birth control-pulm blood clot-treated with coumadin-no problems now    Patient Active Problem List   Diagnosis Date Noted  . Personal history of venous thrombosis and embolism 04/19/2016  . Abnormal weight gain 08/29/2012  . DVT (deep venous thrombosis) ([place]) 06/08/2012  . SOB (shortness of breath) 06/07/2012  . Pulmonary embolism, bilateral ([facility]) 06/07/2012  . Pulmonary  embolism 10 yrs ago 06/07/2012  . Abnormal uterine bleeding 07/14/2011  . Contraceptive management 04/21/2011    Past Surgical History:  Procedure Laterality Date  . IUD REMOVAL  OCT 2012  . LAPAROSCOPIC TUBAL LIGATION  07/14/2011   Procedure: LAPAROSCOPIC TUBAL LIGATION;  Surgeon: Agnes Lawrence, MD;  Location: [facility];  Service: Gynecology;  Laterality: N/A;  . NO PAST SURGERIES    . TUBAL LIGATION  04/21/2011   Procedure: ESSURE TUBAL STERILIZATION;  Surgeon: Agnes Lawrence, MD;  Location: [facility];  Service: Gynecology;  Laterality: Bilateral;   attempted essure,removal of IUD, hysteroscopy,         dilatation and currettage  . vaginal ablasion      OB History    No data available     Home Medications    Prior to Admission medications   Medication Sig Start Date End Date Taking? Authorizing Provider  enoxaparin (LOVENOX) 100 MG/ML injection Inject 1 mL (100 mg total) into the skin every 12 (twelve) hours. 05/19/16   Truitt Merle, MD  naproxen sodium (ANAPROX) 220 MG tablet Take 220 mg by mouth 2 (two) times daily as needed (for pain). Reported on 10/07/2015    Historical Provider, MD  warfarin (COUMADIN) 5 MG tablet Take 1 tablet (5 mg total) by mouth daily. 05/19/16   Truitt Merle, MD    Family History Family History  Problem Relation Age of Onset  . Diabetes Mother   . Diabetes Father   . Diabetes Sister     Social History Social History  Substance Use Topics  . Smoking status: Never  Smoker  . Smokeless tobacco: Never Used  . Alcohol use No     Allergies   Sulfa antibiotics   Review of Systems Review of Systems A complete 10 system review of systems was obtained and all systems are negative except as noted in the HPI and PMH.   Physical Exam Updated Vital Signs BP (!) 130/54 (BP Location: Right Arm)   Pulse 89   Temp 99 F (37.2 C) (Oral)   Resp 18   Ht 5\' 8"  (1.727 m)   Wt 213 lb (96.6 kg)   SpO2 100%   BMI 32.39 kg/m   Physical Exam    Constitutional: She is oriented to person, place, and time. Vital signs are normal. She appears well-developed and well-nourished.  HENT:  Head: Normocephalic and atraumatic.  Right Ear: Hearing normal.  Left Ear: Hearing normal.  Eyes: Conjunctivae and EOM are normal. Pupils are equal, round, and reactive to light.  Neck: Normal range of motion. Neck supple.  Cardiovascular: Normal rate, regular rhythm, normal heart sounds and intact distal pulses.  Exam reveals no gallop and no friction rub.   No murmur heard. Pulmonary/Chest: Effort normal and breath sounds normal. She has no wheezes.  Abdominal: Soft. There is no tenderness.  Musculoskeletal: Normal range of motion.  Distal pulses appreciated  Neurological: She is alert and oriented to person, place, and time.  Skin: Skin is warm and dry.  Psychiatric: She has a normal mood and affect. Her speech is normal and behavior is normal. Thought content normal.  Nursing note and vitals reviewed.  ED Treatments / Results   DIAGNOSTIC STUDIES: Oxygen Saturation is 100% on RA, normal by my interpretation.    Labs (all labs ordered are listed, but only abnormal results are displayed) Labs Reviewed  BASIC METABOLIC PANEL - Abnormal; Notable for the following:       Result Value   Calcium 8.5 (*)    All other components within normal limits  PROTIME-INR - Abnormal; Notable for the following:    Prothrombin Time 15.5 (*)    All other components within normal limits  CBC  I-STAT TROPOININ, ED   EKG  EKG Interpretation None       Radiology Dg Chest 2 View  Result Date: 05/23/2016 CLINICAL DATA:  50 y/o F; new onset central chest tightness for 2 days. EXAM: CHEST  2 VIEW COMPARISON:  03/03/2015 chest radiograph and 05/17/2016 chest CT FINDINGS: The heart size and mediastinal contours are within normal limits and stable. Both lungs are clear. Mild multilevel degenerative changes thoracic spine. IMPRESSION: Clear lungs.  Electronically Signed   By: Kristine Garbe M.D.   On: 05/23/2016 20:56   Ct Angio Chest Pe W And/or Wo Contrast  Result Date: 05/23/2016 CLINICAL DATA:  Gradually worsening chest pressure beginning this afternoon. Diagnosed with pulmonary embolism May 17, 2016. EXAM: CT ANGIOGRAPHY CHEST WITH CONTRAST TECHNIQUE: Multidetector CT imaging of the chest was performed using the standard protocol during bolus administration of intravenous contrast. Multiplanar CT image reconstructions and MIPs were obtained to evaluate the vascular anatomy. CONTRAST:  100 cc Isovue 370 COMPARISON:  CT chest May 17, 2016 FINDINGS: CARDIOVASCULAR: Adequate contrast opacification of the pulmonary artery's. Main pulmonary artery is not enlarged. Partial re- cannulization of the lingula pulmonary artery with minimal residual nonocclusive embolus. Small are, residual LEFT lower lobe segmental disc sub sign mental nonocclusive pulmonary emboli. Heart size is normal. No RIGHT heart strain. Thoracic aorta is normal course and caliber, 2 vessel arch.  Trace calcific atherosclerosis of the aortic arch. MEDIASTINUM/NODES: No lymphadenopathy by CT size criteria. LUNGS/PLEURA: Tracheobronchial tree is patent, no pneumothorax. Minimal bronchial wall thickening. Heterogeneous lung attenuation the lung bases without pleural effusion or focal consolidation. UPPER ABDOMEN: Included view of the abdomen is unremarkable. MUSCULOSKELETAL: Visualized soft tissues and included osseous structures are nonacute. Moderate degenerative change of thoracic spine. Mild broad dextroscoliosis. Review of the MIP images confirms the above findings. IMPRESSION: Resolving lingular and LEFT lower lobe nonocclusive pulmonary emboli. No acute pulmonary embolus or RIGHT heart strain. New bronchial wall wall thickening can be seen with bronchitis or reactive airway disease with findings of small airway disease. Electronically Signed   By: Elon Alas  M.D.   On: 05/23/2016 22:52    Procedures Procedures (including critical care time)  Medications Ordered in ED Medications - No data to display   Initial Impression / Assessment and Plan / ED Course  I have reviewed the triage vital signs and the nursing notes.  Pertinent labs & imaging results that were available during my care of the patient were reviewed by me and considered in my medical decision making (see chart for details).  Clinical Course   Final Clinical Impressions(s) / ED Diagnoses  I have reviewed and evaluated the relevant laboratory values I have reviewed and evaluated the relevant imaging studies.  I have interpreted the relevant EKG. I have reviewed the relevant previous healthcare records. I obtained HPI from historian. Patient discussed with supervising physician  ED Course: 8:43 PM-Discussed treatment plan which includes labs and chest x-ray with pt at bedside and pt agreed to plan.   Assessment: Pt is a 26yF with hx DVT/PE on Xarelto and Coumadin who presents with increasing chest pressure starting today. No radiation. No diaphoresis. Notes nausea. No Emesis. Recently Dx PE on 05-17-16. Seen PCP today and thought to be related to PE. On exam, pt in NAD. Nontoxic/nonseptic appearing. VSS. O2 sat WNL. Afebrile. Lungs CTA. Heart RRR. Abdomen nontender soft. Distal pulses apprecaited. CBC unremarkable. BMP unremarkable. iStat Trop negative. PT/INR below therapeutic, but pt on transition from Lovenox to Coumadin. CXR unremarkable. Discussed with supervising physician. CT Angio obtained to eval for worsening clot formation, which showed decrease in PE with no right heart strain. Showed possible Bronchitis. Plan is to DC home with follow up to PCP. At time of discharge, Patient is in no acute distress. Vital Signs are stable. Patient is able to ambulate. Patient able to tolerate PO.    Disposition/Plan:  DC Home Additional Verbal discharge instructions given and discussed  with patient.  Pt Instructed to f/u with PCP in the next week for evaluation and treatment of symptoms. Return precautions given Pt acknowledges and agrees with plan  Supervising Physician Daleen Bo, MD   Final diagnoses:  Bronchitis  Viral syndrome    I personally performed the services described in this documentation, which was scribed in my presence. The recorded information has been reviewed and is accurate.   New Prescriptions New Prescriptions   No medications on file     Shary Decamp, PA-C 05/23/16 [place], MD 05/24/16 0100

## 2016-05-25 ENCOUNTER — Telehealth: Payer: Self-pay | Admitting: *Deleted

## 2016-05-25 NOTE — Telephone Encounter (Signed)
FYI "Do I have appointment with the doctor tomorrow?  I learned I have bronchitis but I do not have a cough.  It's been two days and I don't feel better.  Is this normal?  The ED doctor did not give me anything because I'm on a blood thinner and said it's viral.  I feel tightness in my chest and have blood clots in my chest is why I'm concerned."   Lab only scheduled tomorrow.  Advised she has inflammation in her lungs, viruses 'run their course of several days to two weeks or longer'.  Symptoms vary on an individual basis.  Advised she treat the symptoms.  Let pharmacist know she's on coumadin and Lovenox asking what's safe to take for her chest congestion.  Robitussin DM or CF, mucinex may be what pharmacist recommends.  Monitor and contact PCP if fever develop or symptoms worsen within the next seven to ten days.

## 2016-05-26 ENCOUNTER — Other Ambulatory Visit (HOSPITAL_BASED_OUTPATIENT_CLINIC_OR_DEPARTMENT_OTHER): Payer: BC Managed Care – PPO

## 2016-05-26 DIAGNOSIS — Z86718 Personal history of other venous thrombosis and embolism: Secondary | ICD-10-CM

## 2016-05-26 DIAGNOSIS — I2699 Other pulmonary embolism without acute cor pulmonale: Secondary | ICD-10-CM

## 2016-05-26 LAB — PROTIME-INR
INR: 2 (ref 2.00–3.50)
Protime: 24 Seconds — ABNORMAL HIGH (ref 10.6–13.4)

## 2016-05-26 LAB — CBC WITH DIFFERENTIAL/PLATELET
BASO%: 1 % (ref 0.0–2.0)
Basophils Absolute: 0.1 10*3/uL (ref 0.0–0.1)
EOS%: 2.3 % (ref 0.0–7.0)
Eosinophils Absolute: 0.1 10*3/uL (ref 0.0–0.5)
HCT: 36.5 % (ref 34.8–46.6)
HGB: 11.8 g/dL (ref 11.6–15.9)
LYMPH%: 36.4 % (ref 14.0–49.7)
MCH: 28.9 pg (ref 25.1–34.0)
MCHC: 32.5 g/dL (ref 31.5–36.0)
MCV: 88.9 fL (ref 79.5–101.0)
MONO#: 0.5 10*3/uL (ref 0.1–0.9)
MONO%: 9.5 % (ref 0.0–14.0)
NEUT#: 2.6 10*3/uL (ref 1.5–6.5)
NEUT%: 50.8 % (ref 38.4–76.8)
Platelets: 257 10*3/uL (ref 145–400)
RBC: 4.1 10*6/uL (ref 3.70–5.45)
RDW: 13.6 % (ref 11.2–14.5)
WBC: 5.1 10*3/uL (ref 3.9–10.3)
lymph#: 1.9 10*3/uL (ref 0.9–3.3)

## 2016-05-30 ENCOUNTER — Other Ambulatory Visit (HOSPITAL_BASED_OUTPATIENT_CLINIC_OR_DEPARTMENT_OTHER): Payer: BC Managed Care – PPO

## 2016-05-30 ENCOUNTER — Other Ambulatory Visit: Payer: Self-pay | Admitting: Hematology

## 2016-05-30 DIAGNOSIS — Z86718 Personal history of other venous thrombosis and embolism: Secondary | ICD-10-CM

## 2016-05-30 DIAGNOSIS — I2699 Other pulmonary embolism without acute cor pulmonale: Secondary | ICD-10-CM | POA: Diagnosis not present

## 2016-05-30 LAB — CBC WITH DIFFERENTIAL/PLATELET
BASO%: 0.4 % (ref 0.0–2.0)
Basophils Absolute: 0 10*3/uL (ref 0.0–0.1)
EOS%: 2.4 % (ref 0.0–7.0)
Eosinophils Absolute: 0.2 10*3/uL (ref 0.0–0.5)
HCT: 36.2 % (ref 34.8–46.6)
HGB: 11.9 g/dL (ref 11.6–15.9)
LYMPH%: 24.7 % (ref 14.0–49.7)
MCH: 29.2 pg (ref 25.1–34.0)
MCHC: 32.9 g/dL (ref 31.5–36.0)
MCV: 88.9 fL (ref 79.5–101.0)
MONO#: 0.6 10*3/uL (ref 0.1–0.9)
MONO%: 8.1 % (ref 0.0–14.0)
NEUT#: 4.6 10*3/uL (ref 1.5–6.5)
NEUT%: 64.4 % (ref 38.4–76.8)
Platelets: 248 10*3/uL (ref 145–400)
RBC: 4.07 10*6/uL (ref 3.70–5.45)
RDW: 13.4 % (ref 11.2–14.5)
WBC: 7.1 10*3/uL (ref 3.9–10.3)
lymph#: 1.8 10*3/uL (ref 0.9–3.3)
nRBC: 0 % (ref 0–0)

## 2016-05-30 LAB — PROTIME-INR
INR: 3.3 (ref 2.00–3.50)
Protime: 39.6 Seconds — ABNORMAL HIGH (ref 10.6–13.4)

## 2016-06-01 ENCOUNTER — Telehealth: Payer: Self-pay | Admitting: Nurse Practitioner

## 2016-06-01 NOTE — Telephone Encounter (Signed)
Notified Bianca Simmons that Dr. Burr Medico would like for her to continue Coumadin 7.5 mg daily. Follow-up as scheduled 06/06/2016.

## 2016-06-06 ENCOUNTER — Telehealth: Payer: Self-pay | Admitting: Hematology

## 2016-06-06 ENCOUNTER — Ambulatory Visit (HOSPITAL_BASED_OUTPATIENT_CLINIC_OR_DEPARTMENT_OTHER): Payer: BC Managed Care – PPO | Admitting: Hematology

## 2016-06-06 ENCOUNTER — Other Ambulatory Visit (HOSPITAL_BASED_OUTPATIENT_CLINIC_OR_DEPARTMENT_OTHER): Payer: BC Managed Care – PPO

## 2016-06-06 VITALS — BP 111/55 | HR 75 | Temp 98.6°F | Resp 18 | Ht 68.0 in | Wt 222.5 lb

## 2016-06-06 DIAGNOSIS — I2699 Other pulmonary embolism without acute cor pulmonale: Secondary | ICD-10-CM

## 2016-06-06 DIAGNOSIS — R079 Chest pain, unspecified: Secondary | ICD-10-CM | POA: Diagnosis not present

## 2016-06-06 DIAGNOSIS — Z86718 Personal history of other venous thrombosis and embolism: Secondary | ICD-10-CM | POA: Diagnosis not present

## 2016-06-06 DIAGNOSIS — R739 Hyperglycemia, unspecified: Secondary | ICD-10-CM

## 2016-06-06 DIAGNOSIS — Z7901 Long term (current) use of anticoagulants: Secondary | ICD-10-CM

## 2016-06-06 DIAGNOSIS — E669 Obesity, unspecified: Secondary | ICD-10-CM

## 2016-06-06 LAB — CBC WITH DIFFERENTIAL/PLATELET
BASO%: 0.4 % (ref 0.0–2.0)
Basophils Absolute: 0 10*3/uL (ref 0.0–0.1)
EOS%: 1.5 % (ref 0.0–7.0)
Eosinophils Absolute: 0.1 10*3/uL (ref 0.0–0.5)
HCT: 36.1 % (ref 34.8–46.6)
HGB: 11.9 g/dL (ref 11.6–15.9)
LYMPH%: 23 % (ref 14.0–49.7)
MCH: 29.6 pg (ref 25.1–34.0)
MCHC: 33 g/dL (ref 31.5–36.0)
MCV: 89.8 fL (ref 79.5–101.0)
MONO#: 0.5 10*3/uL (ref 0.1–0.9)
MONO%: 6.2 % (ref 0.0–14.0)
NEUT#: 5.9 10*3/uL (ref 1.5–6.5)
NEUT%: 68.9 % (ref 38.4–76.8)
Platelets: 287 10*3/uL (ref 145–400)
RBC: 4.02 10*6/uL (ref 3.70–5.45)
RDW: 13.6 % (ref 11.2–14.5)
WBC: 8.6 10*3/uL (ref 3.9–10.3)
lymph#: 2 10*3/uL (ref 0.9–3.3)
nRBC: 0 % (ref 0–0)

## 2016-06-06 LAB — PROTIME-INR
INR: 2.3 (ref 2.00–3.50)
Protime: 27.6 Seconds — ABNORMAL HIGH (ref 10.6–13.4)

## 2016-06-06 NOTE — Progress Notes (Signed)
[facility]  Telephone:([phone] Fax:([phone]) [phone]  Clinic Follow up Note   Patient Care Team: Bianca Lei, MD as PCP - General (Family Medicine) 06/06/2016  DIAGNOSIS:  1. Pulmonary embolism, acute bilateral with high clot burden in association with an asymptomatic DVT of the right lower leg  diagnosed on 06/07/2012. She was treated with Xarelto (rivaroxaban). Doppler study of the right leg carried out on 08/20/2012 and CT angiogram of the chest carried out on 08/23/2012 showed resolution of previous blood clots. Hypercoagulable workup carried out on 06/08/2012 was negative  2. DVT involving the right lower extremity, asymptomatic at presentation with clots seen in the right distal popliteal, peroneal, and posterior tibial veins on Doppler study from 06/08/2012.  3. History of pulmonary embolism in association with birth control pills in 2001.  4. Unprovoked bilateral PE on 05/18/2016  CURRENT THERAPY: coumadin 7.5mg  daily   INTERVAL HISTORY: Bianca Simmons returns for an urgent follow up. She is tolerating Coumadin very well, and is compliant with the medicine, no significant side effects or bleeding. Her dyspnea on exertion has overall much improved, she is able tolerate routine activities without difficulties. She recently developed some upper chest pain, was seen by her primary care physician, felt to be related to her acid reflux, she has started Prilosec a few days ago. No other new complaints.  REVIEW OF SYSTEMS:   Constitutional: Denies fevers, chills or abnormal weight loss, (+) mild fatigue Eyes: Denies blurriness of vision Ears, nose, mouth, throat, and face: Denies mucositis or sore throat Respiratory: see HPI  Cardiovascular: Denies palpitation, chest discomfort or lower extremity swelling Gastrointestinal:  Denies nausea, heartburn or change in bowel habits Skin: Denies abnormal skin rashes Lymphatics: Denies new lymphadenopathy or easy  bruising Neurological:Denies numbness, tingling or new weaknesses Behavioral/Psych: Mood is stable, no new changes  All other systems were reviewed with the patient and are negative.  MEDICAL HISTORY:  Past Medical History:  Diagnosis Date  . Pulmonary embolism, blood-clot, obstetric 2002   on birth control-pulm blood clot-treated with coumadin-no problems now    SURGICAL HISTORY: Past Surgical History:  Procedure Laterality Date  . IUD REMOVAL  OCT 2012  . LAPAROSCOPIC TUBAL LIGATION  07/14/2011   Procedure: LAPAROSCOPIC TUBAL LIGATION;  Surgeon: Bianca Lawrence, MD;  Location: [facility];  Service: Gynecology;  Laterality: N/A;  . NO PAST SURGERIES    . TUBAL LIGATION  04/21/2011   Procedure: ESSURE TUBAL STERILIZATION;  Surgeon: Bianca Lawrence, MD;  Location: [facility];  Service: Gynecology;  Laterality: Bilateral;   attempted essure,removal of IUD, hysteroscopy,         dilatation and currettage  . vaginal ablasion      I have reviewed the social history and family history with the patient and they are unchanged from previous note.  ALLERGIES:  is allergic to sulfa antibiotics.  MEDICATIONS:  Current Outpatient Prescriptions  Medication Sig Dispense Refill  . enoxaparin (LOVENOX) 100 MG/ML injection Inject 1 mL (100 mg total) into the skin every 12 (twelve) hours. 14 Syringe 0  . naproxen sodium (ANAPROX) 220 MG tablet Take 220 mg by mouth 2 (two) times daily as needed (for pain). Reported on 10/07/2015    . warfarin (COUMADIN) 5 MG tablet Take 5 mg by mouth daily.    Marland Kitchen warfarin (COUMADIN) 5 MG tablet Take 1 tablet (5 mg total) by mouth as directed. 30 tablet 1   No current facility-administered medications for this visit.     PHYSICAL EXAMINATION:  ECOG PERFORMANCE STATUS: 1  Vitals:   06/06/16 1536  BP: (!) 111/55  Pulse: 75  Resp: 18  Temp: 98.6 F (37 C)   Filed Weights   06/06/16 1536  Weight: 222 lb 8 oz (100.9 kg)    GENERAL:alert, no distress and  comfortable SKIN: skin color, texture, turgor are normal, no rashes or significant lesions EYES: normal, Conjunctiva are pink and non-injected, sclera clear OROPHARYNX:no exudate, no erythema and lips, buccal mucosa, and tongue normal  NECK: supple, thyroid normal size, non-tender, without nodularity LYMPH:  no palpable lymphadenopathy in the cervical, axillary or inguinal LUNGS: clear to auscultation and percussion with normal breathing effort HEART: regular rate & rhythm and no murmurs and no lower extremity edema ABDOMEN:abdomen soft, non-tender and normal bowel sounds Musculoskeletal:no cyanosis of digits and no clubbing  NEURO: alert & oriented x 3 with fluent speech, no focal motor/sensory deficits  LABORATORY DATA:  I have reviewed the data as listed CBC Latest Ref Rng & Units 06/06/2016 05/30/2016 05/26/2016  WBC 3.9 - 10.3 10e3/uL 8.6 7.1 5.1  Hemoglobin 11.6 - 15.9 g/dL 11.9 11.9 11.8  Hematocrit 34.8 - 46.6 % 36.1 36.2 36.5  Platelets 145 - 400 10e3/uL 287 248 257     CMP Latest Ref Rng & Units 05/23/2016 05/17/2016 04/19/2016  Glucose 65 - 99 mg/dL 90 102(H) 113  BUN 6 - 20 mg/dL 13 10 11.6  Creatinine 0.44 - 1.00 mg/dL 0.81 0.69 0.9  Sodium 135 - 145 mmol/L 139 137 138  Potassium 3.5 - 5.1 mmol/L 3.5 3.9 3.8  Chloride 101 - 111 mmol/L 108 107 -  CO2 22 - 32 mmol/L 22 22 21(L)  Calcium 8.9 - 10.3 mg/dL 8.5(L) 8.7(L) 8.7  Total Protein 6.4 - 8.3 g/dL - - 7.5  Total Bilirubin 0.20 - 1.20 mg/dL - - 0.38  Alkaline Phos 40 - 150 U/L - - 107  AST 5 - 34 U/L - - 18  ALT 0 - 55 U/L - - 18   Results for Bianca Simmons, Bianca Simmons (MRN [id number]) as of 06/11/2016 17:20  Ref. Range 05/23/2016 09:32 05/23/2016 20:25 05/26/2016 16:09 05/30/2016 16:09 06/06/2016 15:07  Protime Latest Ref Range: 10.6 - 13.4 Seconds 15.6 (H)  24.0 (H) 39.6 (H) 27.6 (H)  Prothrombin Time Latest Ref Range: 11.4 - 15.2 seconds  15.5 (H)     INR Latest Ref Range: 2.00 - 3.50  1.30 (L) 1.22 2.00 3.30 2.30      RADIOGRAPHIC STUDIES: I have personally reviewed the radiological images as listed and agreed with the findings in the report. No results found.   ASSESSMENT & PLAN:  50 year-old female, with history of one episode of PE when she was on birth control pill, and second episode of unprovoked PE and DVT. She lost to follow-up subsequently. She presented with unprovoked PE again when she was on Xarelto in 04/2016  1. Recurrent PE and DVT -She had a negative hypercoagulation workup -Giving the recurrent pulmonary and reason, especially second episode was unprovoked, young age, and low risk of bleeding, I recommend her to restart prophylactic anticoagulation indefinitely. She agrees. -She developed unprovoked bilateral PE when she was on Xarelto 20 mg daily in 04/2016.  -I have changed her to Coumadin, she is on 715 mg daily, INR therapeutic, 2.3 today, goal is 2.0-3.0 -Her dyspnea on exertion and hypoxia has resolved -She preferred to follow-up with [place] for her anticoagulation management.  2. Obesity, hyperglycemia  -she recently started on Janumet by her PCP  -  I encouraged her eat healthy, and exercise regularly.  3. Chest pain, probably related to GERD -She has started Prilosec by her primary care physician  Follow-up -Continue Coumadin 7.5 mg daily -PT/INR in 2 weeks, if therapeutic, then every 4 weeks afterwards -I'll see her back in 3 months  All questions were answered. The patient knows to call the clinic with any problems, questions or concerns. No barriers to learning was detected.  I spent 15 minutes counseling the patient face to face. The total time spent in the appointment was 20 minutes and more than 50% was on counseling and review of test results     Bianca Merle, MD 06/06/16

## 2016-06-06 NOTE — Telephone Encounter (Signed)
All appointments scheduled per 06/06/16 los. AVS report and appointment schedule given to patient, per 06/06/16 los.

## 2016-06-11 ENCOUNTER — Encounter: Payer: Self-pay | Admitting: Hematology

## 2016-06-20 ENCOUNTER — Other Ambulatory Visit (HOSPITAL_BASED_OUTPATIENT_CLINIC_OR_DEPARTMENT_OTHER): Payer: BC Managed Care – PPO

## 2016-06-20 DIAGNOSIS — I2699 Other pulmonary embolism without acute cor pulmonale: Secondary | ICD-10-CM | POA: Diagnosis not present

## 2016-06-20 DIAGNOSIS — Z86718 Personal history of other venous thrombosis and embolism: Secondary | ICD-10-CM

## 2016-06-20 LAB — CBC WITH DIFFERENTIAL/PLATELET
BASO%: 0.5 % (ref 0.0–2.0)
Basophils Absolute: 0 10*3/uL (ref 0.0–0.1)
EOS%: 2.3 % (ref 0.0–7.0)
Eosinophils Absolute: 0.2 10*3/uL (ref 0.0–0.5)
HCT: 34 % — ABNORMAL LOW (ref 34.8–46.6)
HGB: 11.2 g/dL — ABNORMAL LOW (ref 11.6–15.9)
LYMPH%: 27 % (ref 14.0–49.7)
MCH: 29.2 pg (ref 25.1–34.0)
MCHC: 32.9 g/dL (ref 31.5–36.0)
MCV: 88.5 fL (ref 79.5–101.0)
MONO#: 0.5 10*3/uL (ref 0.1–0.9)
MONO%: 7.1 % (ref 0.0–14.0)
NEUT#: 4.7 10*3/uL (ref 1.5–6.5)
NEUT%: 63.1 % (ref 38.4–76.8)
Platelets: 208 10*3/uL (ref 145–400)
RBC: 3.84 10*6/uL (ref 3.70–5.45)
RDW: 13.5 % (ref 11.2–14.5)
WBC: 7.4 10*3/uL (ref 3.9–10.3)
lymph#: 2 10*3/uL (ref 0.9–3.3)
nRBC: 0 % (ref 0–0)

## 2016-06-20 LAB — PROTIME-INR
INR: 3.1 (ref 2.00–3.50)
Protime: 37.2 Seconds — ABNORMAL HIGH (ref 10.6–13.4)

## 2016-06-23 ENCOUNTER — Telehealth: Payer: Self-pay | Admitting: *Deleted

## 2016-06-23 NOTE — Telephone Encounter (Signed)
Spoke with patient and let her know that Dr. Burr Medico wants her to continue with the same dose of coumadin. = 7.5mg  daily.  Patient will contact her pharmacy for a refill.

## 2016-07-04 ENCOUNTER — Other Ambulatory Visit (HOSPITAL_BASED_OUTPATIENT_CLINIC_OR_DEPARTMENT_OTHER): Payer: BC Managed Care – PPO

## 2016-07-04 ENCOUNTER — Telehealth: Payer: Self-pay | Admitting: *Deleted

## 2016-07-04 ENCOUNTER — Ambulatory Visit: Payer: Self-pay | Admitting: *Deleted

## 2016-07-04 ENCOUNTER — Encounter: Payer: Self-pay | Admitting: *Deleted

## 2016-07-04 DIAGNOSIS — I2699 Other pulmonary embolism without acute cor pulmonale: Secondary | ICD-10-CM

## 2016-07-04 LAB — PROTIME-INR
INR: 4.6 — ABNORMAL HIGH (ref 2.00–3.50)
Protime: 55.2 Seconds — ABNORMAL HIGH (ref 10.6–13.4)

## 2016-07-04 NOTE — Telephone Encounter (Signed)
Called pt & informed to change coumadin dose to 5 mg MWF & 7.5 mg other days.  She has 5 mg dose & expressed understanding of change & will recheck in 2 wks.  Message to scheduler.

## 2016-07-06 ENCOUNTER — Telehealth: Payer: Self-pay | Admitting: Hematology

## 2016-07-06 NOTE — Telephone Encounter (Signed)
lvm to inform pt of 12/21 appt date/time per LOS

## 2016-07-14 ENCOUNTER — Encounter: Payer: Self-pay | Admitting: Emergency Medicine

## 2016-07-14 ENCOUNTER — Emergency Department: Payer: BC Managed Care – PPO

## 2016-07-14 ENCOUNTER — Emergency Department
Admission: EM | Admit: 2016-07-14 | Discharge: 2016-07-14 | Disposition: A | Discharge status: Home / Self Care | Payer: BC Managed Care – PPO | Attending: Emergency Medicine | Admitting: Emergency Medicine

## 2016-07-14 DIAGNOSIS — H538 Other visual disturbances: Secondary | ICD-10-CM

## 2016-07-14 DIAGNOSIS — Z7901 Long term (current) use of anticoagulants: Secondary | ICD-10-CM | POA: Diagnosis not present

## 2016-07-14 DIAGNOSIS — R42 Dizziness and giddiness: Secondary | ICD-10-CM

## 2016-07-14 LAB — URINALYSIS, COMPLETE (UACMP) WITH MICROSCOPIC
Bacteria, UA: NONE SEEN
Bilirubin Urine: NEGATIVE
Glucose, UA: NEGATIVE mg/dL
Hgb urine dipstick: NEGATIVE
Ketones, ur: NEGATIVE mg/dL
Leukocytes, UA: NEGATIVE
Nitrite: NEGATIVE
Protein, ur: NEGATIVE mg/dL
RBC / HPF: NONE SEEN RBC/hpf (ref 0–5)
Specific Gravity, Urine: 1.023 (ref 1.005–1.030)
pH: 5 (ref 5.0–8.0)

## 2016-07-14 LAB — CBC
HCT: 34.2 % — ABNORMAL LOW (ref 35.0–47.0)
Hemoglobin: 11.5 g/dL — ABNORMAL LOW (ref 12.0–16.0)
MCH: 29.2 pg (ref 26.0–34.0)
MCHC: 33.6 g/dL (ref 32.0–36.0)
MCV: 87 fL (ref 80.0–100.0)
Platelets: 251 10*3/uL (ref 150–440)
RBC: 3.93 MIL/uL (ref 3.80–5.20)
RDW: 14.4 % (ref 11.5–14.5)
WBC: 5.6 10*3/uL (ref 3.6–11.0)

## 2016-07-14 LAB — BASIC METABOLIC PANEL
Anion gap: 3 — ABNORMAL LOW (ref 5–15)
BUN: 14 mg/dL (ref 6–20)
CO2: 24 mmol/L (ref 22–32)
Calcium: 8.3 mg/dL — ABNORMAL LOW (ref 8.9–10.3)
Chloride: 107 mmol/L (ref 101–111)
Creatinine, Ser: 0.78 mg/dL (ref 0.44–1.00)
GFR calc Af Amer: >60 mL/min (ref >60)
GFR calc non Af Amer: >60 mL/min (ref >60)
Glucose, Bld: 108 mg/dL — ABNORMAL HIGH (ref 65–99)
Potassium: 3.4 mmol/L — ABNORMAL LOW (ref 3.5–5.1)
Sodium: 134 mmol/L — ABNORMAL LOW (ref 135–145)

## 2016-07-14 LAB — PROTIME-INR
INR: 2
Prothrombin Time: 23 seconds — ABNORMAL HIGH (ref 11.4–15.2)

## 2016-07-14 LAB — POCT PREGNANCY, URINE: Preg Test, Ur: NEGATIVE

## 2016-07-14 NOTE — ED Triage Notes (Addendum)
Pt reports dizziness and blurry vision onset last night while working on computer.  When she went to bed started with headache. Woke up this morning feeling fine but now reports being dizzy again. Denies symptoms other than dizziness/lightheaded at this time. Speech WNL. No facial droop noted. No difficulty with ambulation. Reports also felt like this about one week ago.

## 2016-07-14 NOTE — ED Notes (Signed)
Pt spoke with MRI on the phone. Updated on plan

## 2016-07-14 NOTE — ED Notes (Signed)
Pt alert watching tv   Family with pt.  No acute distress noted.

## 2016-07-14 NOTE — ED Notes (Signed)
Pt placed in room 7   Family at bedside .Marland Kitchen

## 2016-07-14 NOTE — ED Provider Notes (Signed)
-----------------------------------------   3:28 PM on 07/14/2016 -----------------------------------------   Blood pressure (!) 124/59, pulse 67, temperature 98.1 F (36.7 C), temperature source Oral, resp. rate 18, height 5\' 8"  (1.727 m), weight 97.5 kg, SpO2 99 %.  Assuming care from Dr. Alfred Levins.  In short, Bianca Simmons is a 50 y.o. female with a chief complaint of Dizziness .  Refer to the original H&P for additional details.  The current plan of care is to follow-up the results of the patient's MR imaging and reassess.  The patient is anticoagulated but also has a known hypercoagulability disorder.  If her imaging is normal she will be appropriate for outpatient follow-up and if abnormal will require inpatient evaluation and treatment for CVA in spite of warfarin.   ----------------------------------------- 5:00 PM on 07/14/2016 -----------------------------------------  The patient's MR imaging is all negative including MR brain, MRA head, and MRV.  I discussed these results with the patient and encouraged close outpatient follow-up but explained that there is no evidence of acute or emergent medical condition at this time.  I gave my usual and customary return precautions.      Hinda Kehr, MD 07/14/16 [phone]

## 2016-07-14 NOTE — Discharge Instructions (Signed)
As we discussed, your workup today was reassuring.  Though we do not know exactly what is causing your symptoms, it appears that you have no emergent medical condition at this time and that you are safe to go home and follow up as recommended in this paperwork. ° °Please return immediately to the Emergency Department if you develop any new or worsening symptoms that concern you. ° °

## 2016-07-14 NOTE — ED Notes (Signed)
Pt transported to MRI by this tech.

## 2016-07-14 NOTE — ED Provider Notes (Signed)
[facility] Emergency Department Provider Note  ____________________________________________  Time seen: Approximately 1:33 PM  I have reviewed the triage vital signs and the nursing notes.   HISTORY  Chief Complaint Dizziness   HPI Bianca Simmons is a 50 y.o. 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 Patient reports yesterday evening she was working at computer when she developed blurry vision and felt lightheaded like she was going to pass out. She laid down and reports that she felt a throbbing sensation in her head. She went to sleep and when she woke up this morning she was feeling better. She then drove herself in to work and as soon as she got in to work her symptoms recurred. She currently endorses blurry vision and the same throbbing sensation in her head. She denies a headache. She also endorses lightheadedness and denies vertigo. She denies slurred speech, difficulty finding words, facial droop, unilateral numbness or weakness. No personal or family history of stroke, no history of smoking.  Past Medical History:  Diagnosis Date  . Pulmonary embolism, blood-clot, obstetric 2002   on birth control-pulm blood clot-treated with coumadin-no problems now    Patient Active Problem List   Diagnosis Date Noted  . Personal history of venous thrombosis and embolism 04/19/2016  . Abnormal weight gain 08/29/2012  . DVT (deep venous thrombosis) ([place]) 06/08/2012  . SOB (shortness of breath) 06/07/2012  . Pulmonary embolism, bilateral ([place]) 06/07/2012  . Pulmonary embolism 10 yrs ago 06/07/2012  . Abnormal uterine bleeding 07/14/2011  . Contraceptive management 04/21/2011    Past Surgical History:  Procedure Laterality Date  . IUD REMOVAL  OCT 2012  . LAPAROSCOPIC TUBAL LIGATION  07/14/2011   Procedure: LAPAROSCOPIC TUBAL LIGATION;  Surgeon: Agnes Lawrence, MD;  Location: [facility];  Service: Gynecology;   Laterality: N/A;  . NO PAST SURGERIES    . TUBAL LIGATION  04/21/2011   Procedure: ESSURE TUBAL STERILIZATION;  Surgeon: Agnes Lawrence, MD;  Location: [facility];  Service: Gynecology;  Laterality: Bilateral;   attempted essure,removal of IUD, hysteroscopy,         dilatation and currettage  . vaginal ablasion      Prior to Admission medications   Medication Sig Start Date End Date Taking? Authorizing Provider  acetaminophen (TYLENOL) 500 MG tablet Take by mouth as needed.    Historical Provider, MD  naproxen sodium (ANAPROX) 220 MG tablet Take 220 mg by mouth 2 (two) times daily as needed (for pain). Reported on 10/07/2015    Historical Provider, MD  omeprazole (PRILOSEC) 20 MG capsule Take 20 mg by mouth daily.    Historical Provider, MD  warfarin (COUMADIN) 7.5 MG tablet Take 7.5 mg by mouth daily. 5 mg MWF & 7.5 mg other days 07/04/16.    Historical Provider, MD    Allergies Sulfa antibiotics  Family History  Problem Relation Age of Onset  . Diabetes Mother   . Diabetes Father   . Diabetes Sister     Social History Social History  Substance Use Topics  . Smoking status: Never Smoker  . Smokeless tobacco: Never Used  . Alcohol use No    Review of Systems  Constitutional: Negative for fever. + Lightheadedness Eyes: + blurry vision ENT: Negative for sore throat. Neck: No neck pain  Cardiovascular: Negative for chest pain. Respiratory: Negative for shortness of breath. Gastrointestinal: Negative for abdominal pain, vomiting or diarrhea. Genitourinary: Negative for dysuria. Musculoskeletal: Negative  for back pain. Skin: Negative for rash. Neurological: Negative for headaches, weakness or numbness. Psych: No SI or HI  ____________________________________________   PHYSICAL EXAM:  VITAL SIGNS: ED Triage Vitals [07/14/16 0944]  Enc Vitals Group     BP (!) 123/49     Pulse Rate 66     Resp 20     Temp 98.1 F (36.7 C)     Temp Source Oral     SpO2 100 %      Weight 215 lb (97.5 kg)     Height 5\' 8"  (1.727 m)     Head Circumference      Peak Flow      Pain Score      Pain Loc      Pain Edu?      Excl. in [place]?     Constitutional: Alert and oriented. Well appearing and in no apparent distress. HEENT:      Head: Normocephalic and atraumatic.         Eyes: Conjunctivae are normal. Sclera is non-icteric. EOMI. PERRL      Mouth/Throat: Mucous membranes are moist.       Neck: Supple with no signs of meningismus. Cardiovascular: Regular rate and rhythm. No murmurs, gallops, or rubs. 2+ symmetrical distal pulses are present in all extremities. No JVD. Respiratory: Normal respiratory effort. Lungs are clear to auscultation bilaterally. No wheezes, crackles, or rhonchi.  Gastrointestinal: Soft, non tender, and non distended with positive bowel sounds. No rebound or guarding. Musculoskeletal: Nontender with normal range of motion in all extremities. No edema, cyanosis, or erythema of extremities. Neurologic: Normal speech and language. A & O x3, PERRL, no nystagmus, CN II-XII intact, motor testing reveals good tone and bulk throughout. There is no evidence of pronator drift or dysmetria. Muscle strength is 5/5 throughout. Deep tendon reflexes are 2+ throughout with downgoing toes. Sensory examination is intact. Gait is normal. Skin: Skin is warm, dry and intact. No rash noted. Psychiatric: Mood and affect are normal. Speech and behavior are normal.  ____________________________________________   LABS (all labs ordered are listed, but only abnormal results are displayed)  Labs Reviewed  BASIC METABOLIC PANEL - Abnormal; Notable for the following:       Result Value   Sodium 134 (*)    Potassium 3.4 (*)    Glucose, Bld 108 (*)    Calcium 8.3 (*)    Anion gap 3 (*)    All other components within normal limits  CBC - Abnormal; Notable for the following:    Hemoglobin 11.5 (*)    HCT 34.2 (*)    All other components within normal limits    URINALYSIS, COMPLETE (UACMP) WITH MICROSCOPIC - Abnormal; Notable for the following:    Color, Urine YELLOW (*)    APPearance CLEAR (*)    Squamous Epithelial / LPF 0-5 (*)    All other components within normal limits  PROTIME-INR - Abnormal; Notable for the following:    Prothrombin Time 23.0 (*)    All other components within normal limits  POC URINE PREG, ED  POCT PREGNANCY, URINE   ____________________________________________  EKG  none ____________________________________________  RADIOLOGY  CT head: Negative  ____________________________________________   PROCEDURES  Procedure(s) performed: None Procedures Critical Care performed:  None ____________________________________________   INITIAL IMPRESSION / ASSESSMENT AND PLAN / ED COURSE  50 y.o. 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 Patient is neurologically intact. She is on Coumadin and endorses compliance with her medication. We'll plan for head CT, INR, basic labs. The CT is negative will pursue an MRI/MRV to rule out stroke versus central venous thrombosis.  Clinical Course as of Jul 14 1540  Fri Jul 14, 2016  1459 INR 2.0, labs and urine with no acute findings. Vital signs within normal limits. Head CT negative. MRI/MRA/MRV pending. Care transferred to Dr. Karma Greaser  [CV]    Clinical Course User Index [CV] Rudene Re, MD    Pertinent labs & imaging results that were available during my care of the patient were reviewed by me and considered in my medical decision making (see chart for details).    ____________________________________________   FINAL CLINICAL IMPRESSION(S) / ED DIAGNOSES  Final diagnoses:  Blurry vision, bilateral  Lightheadedness      NEW MEDICATIONS STARTED DURING THIS VISIT:  New Prescriptions   No medications on file     Note:  This document was prepared  using Dragon voice recognition software and may include unintentional dictation errors.    Rudene Re, MD 07/14/16 [phone]

## 2016-07-14 NOTE — ED Notes (Signed)
Resumed care from [facility].  Pt alert. Pt to mri with er tech.

## 2016-07-14 NOTE — ED Notes (Signed)
ED Provider at bedside. 

## 2016-07-20 ENCOUNTER — Other Ambulatory Visit (HOSPITAL_BASED_OUTPATIENT_CLINIC_OR_DEPARTMENT_OTHER): Payer: BC Managed Care – PPO

## 2016-07-20 DIAGNOSIS — I2699 Other pulmonary embolism without acute cor pulmonale: Secondary | ICD-10-CM | POA: Diagnosis not present

## 2016-07-20 LAB — PROTIME-INR
INR: 2.9 (ref 2.00–3.50)
Protime: 34.8 Seconds — ABNORMAL HIGH (ref 10.6–13.4)

## 2016-07-21 ENCOUNTER — Other Ambulatory Visit: Payer: Self-pay | Admitting: Hematology

## 2016-07-21 DIAGNOSIS — I2699 Other pulmonary embolism without acute cor pulmonale: Secondary | ICD-10-CM

## 2016-08-01 ENCOUNTER — Other Ambulatory Visit (HOSPITAL_BASED_OUTPATIENT_CLINIC_OR_DEPARTMENT_OTHER): Payer: BC Managed Care – PPO

## 2016-08-01 DIAGNOSIS — I2699 Other pulmonary embolism without acute cor pulmonale: Secondary | ICD-10-CM

## 2016-08-01 LAB — PROTIME-INR
INR: 1.9 — ABNORMAL LOW (ref 2.00–3.50)
Protime: 22.8 Seconds — ABNORMAL HIGH (ref 10.6–13.4)

## 2016-08-04 ENCOUNTER — Telehealth: Payer: Self-pay | Admitting: *Deleted

## 2016-08-04 NOTE — Telephone Encounter (Signed)
-----   Message from Truitt Merle, MD sent at 08/02/2016  8:50 AM EST ----- Please let pt know the lab result, no change of her coumadin dose.   Thanks  Truitt Merle  08/02/2016

## 2016-08-04 NOTE — Telephone Encounter (Signed)
Left VM on pt's mobile # regarding PT/INR & to cont same dose of coumadin & to call back if questions or needs to schedule another lab appt.

## 2016-08-24 NOTE — Progress Notes (Signed)
[place]  Telephone:([phone]) [phone] [phone]  Clinic Follow up Note   Patient Care Team: Bianca Lei, MD as PCP - General (Family Medicine) 08/29/2016  DIAGNOSIS:  1. Pulmonary embolism, acute bilateral with high clot burden in association with an asymptomatic DVT of the right lower leg  diagnosed on 06/07/2012. She was treated with Xarelto (rivaroxaban). Doppler study of the right leg carried out on 08/20/2012 and CT angiogram of the chest carried out on 08/23/2012 showed resolution of previous blood clots. Hypercoagulable workup carried out on 06/08/2012 was negative  2. DVT involving the right lower extremity, asymptomatic at presentation with clots seen in the right distal popliteal, peroneal, and posterior tibial veins on Doppler study from 06/08/2012.  3. History of pulmonary embolism in association with birth control pills in 2001.  4. Unprovoked bilateral PE on 05/18/2016  CURRENT THERAPY: coumadin 5 mg daily on MWF, and 7.5mg  daily on other days   INTERVAL HISTORY: Bianca Simmons returns for an urgent follow up. She is doing well today. For the past month she has had dizziness that she explains is like vertigo. She says it doesn't feel like the room is spinning, it feels like her head is spinning. She sees her PCP for this in February. She has some headaches. She is not sleeping well. Denies any other concerns.  REVIEW OF SYSTEMS:   Constitutional: Denies fevers, chills or abnormal weight loss, (+) mild fatigue Eyes: Denies blurriness of vision Ears, nose, mouth, throat, and face: Denies mucositis or sore throat Respiratory: see HPI  Cardiovascular: Denies palpitation, chest discomfort or lower extremity swelling Gastrointestinal:  Denies nausea, heartburn or change in bowel habits Skin: Denies abnormal skin rashes Lymphatics: Denies new lymphadenopathy or easy bruising Neurological:Denies numbness, tingling or new weaknesses Behavioral/Psych: Mood is  stable, no new changes (+) insomnia.  All other systems were reviewed with the patient and are negative.  MEDICAL HISTORY:  Past Medical History:  Diagnosis Date  . Pulmonary embolism, blood-clot, obstetric 2002   on birth control-pulm blood clot-treated with coumadin-no problems now    SURGICAL HISTORY: Past Surgical History:  Procedure Laterality Date  . IUD REMOVAL  OCT 2012  . LAPAROSCOPIC TUBAL LIGATION  07/14/2011   Procedure: LAPAROSCOPIC TUBAL LIGATION;  Surgeon: Bianca Lawrence, MD;  Location: [facility];  Service: Gynecology;  Laterality: N/A;  . NO PAST SURGERIES    . TUBAL LIGATION  04/21/2011   Procedure: ESSURE TUBAL STERILIZATION;  Surgeon: Bianca Lawrence, MD;  [facility];  Service: Gynecology;  Laterality: Bilateral;   attempted essure,removal of IUD, hysteroscopy,         dilatation and currettage  . vaginal ablasion      I have reviewed the social history and family history with the patient and they are unchanged from previous note.  ALLERGIES:  is allergic to sulfa antibiotics.  MEDICATIONS:  Current Outpatient Prescriptions  Medication Sig Dispense Refill  . acetaminophen (TYLENOL) 500 MG tablet Take by mouth as needed.    . naproxen sodium (ANAPROX) 220 MG tablet Take 220 mg by mouth 2 (two) times daily as needed (for pain). Reported on 10/07/2015    . omeprazole (PRILOSEC) 20 MG capsule Take 20 mg by mouth daily.    Marland Kitchen warfarin (COUMADIN) 7.5 MG tablet Take 7.5 mg by mouth daily. 5 mg MWF & 7.5 mg other days 07/04/16.     No current facility-administered medications for this visit.     PHYSICAL EXAMINATION: ECOG PERFORMANCE STATUS: 1  Vitals:   08/29/16 1439  BP: (!) 133/57  Pulse: 79  Resp: 17  Temp: 98.6 F (37 C)   Filed Weights   08/29/16 1439  Weight: 220 lb 4.8 oz (99.9 kg)   GENERAL:alert, no distress and comfortable SKIN: skin color, texture, turgor are normal, no rashes or significant lesions EYES: normal, Conjunctiva are  pink and non-injected, sclera clear OROPHARYNX:no exudate, no erythema and lips, buccal mucosa, and tongue normal  NECK: supple, thyroid normal size, non-tender, without nodularity LYMPH:  no palpable lymphadenopathy in the cervical, axillary or inguinal LUNGS: clear to auscultation and percussion with normal breathing effort HEART: regular rate & rhythm and no murmurs and no lower extremity edema ABDOMEN:abdomen soft, non-tender and normal bowel sounds Musculoskeletal:no cyanosis of digits and no clubbing  NEURO: alert & oriented x 3 with fluent speech, no focal motor/sensory deficits (+) dizziness, headaches.   LABORATORY DATA:  I have reviewed the data as listed CBC Latest Ref Rng & Units 08/29/2016 07/14/2016 06/20/2016  WBC 3.9 - 10.3 10e3/uL 6.1 5.6 7.4  Hemoglobin 11.6 - 15.9 g/dL 11.6 11.5(L) 11.2(L)  Hematocrit 34.8 - 46.6 % 35.4 34.2(L) 34.0(L)  Platelets 145 - 400 10e3/uL 244 251 208     CMP Latest Ref Rng & Units 07/14/2016 05/23/2016 05/17/2016  Glucose 65 - 99 mg/dL 108(H) 90 102(H)  BUN 6 - 20 mg/dL 14 13 10   Creatinine 0.44 - 1.00 mg/dL 0.78 0.81 0.69  Sodium 135 - 145 mmol/L 134(L) 139 137  Potassium 3.5 - 5.1 mmol/L 3.4(L) 3.5 3.9  Chloride 101 - 111 mmol/L 107 108 107  CO2 22 - 32 mmol/L 24 22 22   Calcium 8.9 - 10.3 mg/dL 8.3(L) 8.5(L) 8.7(L)  Total Protein 6.4 - 8.3 g/dL - - -  Total Bilirubin 0.20 - 1.20 mg/dL - - -  Alkaline Phos 40 - 150 U/L - - -  AST 5 - 34 U/L - - -  ALT 0 - 55 U/L - - -   Results for Bianca Simmons (MRN [id number]) as of 08/29/2016 14:46  Ref. Range 07/04/2016 15:44 07/14/2016 09:49 07/20/2016 13:43 08/01/2016 16:30 08/29/2016 14:24  Protime Latest Ref Range: 10.6 - 13.4 Seconds 55.2 (H)  34.8 (H) 22.8 (H) 28.8 (H)  Prothrombin Time Latest Ref Range: 11.4 - 15.2 seconds  23.0 (H)     INR Latest Ref Range: 2.00 - 3.50  4.60 (H) 2.00 2.90 1.90 (L) 2.40    RADIOGRAPHIC STUDIES: I have personally reviewed the radiological images as listed  and agreed with the findings in the report.  MRI MRA Head 07/14/2016 IMPRESSION: Negative MRI head  Negative MRA head  CT Head 07/14/2016 IMPRESSION: No acute intracranial pathology.  ASSESSMENT & PLAN:  51 y.o. female, with history of one episode of PE when she was on birth control pill, and second episode of unprovoked PE and DVT. She lost to follow-up subsequently. She presented with unprovoked PE again when she was on Xarelto in 04/2016  1. Recurrent PE and DVT -She had a negative hypercoagulation workup -Giving the recurrent pulmonary and reason, especially second episode was unprovoked, young age, and low risk of bleeding, I recommend her to restart prophylactic anticoagulation indefinitely. She agrees. -She developed unprovoked bilateral PE when she was on Xarelto 20 mg daily in 04/2016.  -She is on Coumadin 5mg  daily on MWF and  7.5 mg daily on other days, INR therapeutic, 2.4 today, goal is 2.0-3.0 -Her dyspnea on exertion and hypoxia has resolved -She preferred to follow-up  with [place] for her anticoagulation management.  2. Obesity, hyperglycemia  -she recently started on Janumet by her PCP  -I encouraged her eat healthy, and exercise regularly.  3. Intermittent dizziness -She'll follow-up with her primary care physician  Plan -Continue Coumadin 5 mg daily on MWF and 7.5 mg daily on other days -PT/INR every 4 weeks.  -I'll see her back in 6 months  All questions were answered. The patient knows to call the clinic with any problems, questions or concerns. No barriers to learning was detected.  I spent 15 minutes counseling the patient face to face. The total time spent in the appointment was 20 minutes and more than 50% was on counseling and review of test results   This document serves as a record of services personally performed by Truitt Merle, MD. It was created on her behalf by Martinique Casey, a trained medical scribe. The creation of this record is based on the scribe's  personal observations and the provider's statements to them. This document has been checked and approved by the attending provider.  I have reviewed the above documentation for accuracy and completeness and I agree with the above.   Truitt Merle, MD 08/29/16

## 2016-08-29 ENCOUNTER — Other Ambulatory Visit (HOSPITAL_BASED_OUTPATIENT_CLINIC_OR_DEPARTMENT_OTHER): Payer: BC Managed Care – PPO

## 2016-08-29 ENCOUNTER — Encounter: Payer: Self-pay | Admitting: Hematology

## 2016-08-29 ENCOUNTER — Telehealth: Payer: Self-pay | Admitting: Hematology

## 2016-08-29 ENCOUNTER — Ambulatory Visit (HOSPITAL_BASED_OUTPATIENT_CLINIC_OR_DEPARTMENT_OTHER): Payer: BC Managed Care – PPO | Admitting: Hematology

## 2016-08-29 VITALS — BP 133/57 | HR 79 | Temp 98.6°F | Resp 17 | Ht 68.0 in | Wt 220.3 lb

## 2016-08-29 DIAGNOSIS — Z86718 Personal history of other venous thrombosis and embolism: Secondary | ICD-10-CM

## 2016-08-29 DIAGNOSIS — Z86711 Personal history of pulmonary embolism: Secondary | ICD-10-CM

## 2016-08-29 DIAGNOSIS — R739 Hyperglycemia, unspecified: Secondary | ICD-10-CM | POA: Diagnosis not present

## 2016-08-29 DIAGNOSIS — R42 Dizziness and giddiness: Secondary | ICD-10-CM

## 2016-08-29 DIAGNOSIS — I2699 Other pulmonary embolism without acute cor pulmonale: Secondary | ICD-10-CM

## 2016-08-29 DIAGNOSIS — E669 Obesity, unspecified: Secondary | ICD-10-CM

## 2016-08-29 DIAGNOSIS — Z7901 Long term (current) use of anticoagulants: Secondary | ICD-10-CM

## 2016-08-29 LAB — CBC WITH DIFFERENTIAL/PLATELET
BASO%: 0.3 % (ref 0.0–2.0)
Basophils Absolute: 0 10*3/uL (ref 0.0–0.1)
EOS%: 3 % (ref 0.0–7.0)
Eosinophils Absolute: 0.2 10*3/uL (ref 0.0–0.5)
HCT: 35.4 % (ref 34.8–46.6)
HGB: 11.6 g/dL (ref 11.6–15.9)
LYMPH%: 29.3 % (ref 14.0–49.7)
MCH: 28.6 pg (ref 25.1–34.0)
MCHC: 32.8 g/dL (ref 31.5–36.0)
MCV: 87.2 fL (ref 79.5–101.0)
MONO#: 0.4 10*3/uL (ref 0.1–0.9)
MONO%: 6.1 % (ref 0.0–14.0)
NEUT#: 3.7 10*3/uL (ref 1.5–6.5)
NEUT%: 61.3 % (ref 38.4–76.8)
Platelets: 244 10*3/uL (ref 145–400)
RBC: 4.06 10*6/uL (ref 3.70–5.45)
RDW: 13.2 % (ref 11.2–14.5)
WBC: 6.1 10*3/uL (ref 3.9–10.3)
lymph#: 1.8 10*3/uL (ref 0.9–3.3)
nRBC: 0 % (ref 0–0)

## 2016-08-29 LAB — PROTIME-INR
INR: 2.4 (ref 2.00–3.50)
Protime: 28.8 Seconds — ABNORMAL HIGH (ref 10.6–13.4)

## 2016-08-29 NOTE — Telephone Encounter (Signed)
Appointments scheduled per 1/30 LOS. Patient given AVS report and calendars with future scheduled appointments. °

## 2016-09-12 ENCOUNTER — Telehealth: Payer: Self-pay | Admitting: *Deleted

## 2016-09-12 NOTE — Telephone Encounter (Signed)
-----   Message from Truitt Merle, MD sent at 09/09/2016  9:23 PM EST ----- Let her know the lab result. She is on coumadin, INR therapeutic.   Truitt Merle  09/09/2016

## 2016-09-12 NOTE — Telephone Encounter (Signed)
Left message for pt to call back regarding lab results. 

## 2016-09-20 ENCOUNTER — Other Ambulatory Visit: Payer: Self-pay | Admitting: *Deleted

## 2016-09-20 MED ORDER — WARFARIN SODIUM 7.5 MG PO TABS: 7.5000 mg | ORAL_TABLET | Freq: Every day | ORAL | 2 refills | Status: DC

## 2016-09-25 ENCOUNTER — Other Ambulatory Visit: Payer: Self-pay | Admitting: *Deleted

## 2016-09-25 MED ORDER — WARFARIN SODIUM 5 MG PO TABS: 5.0000 mg | ORAL_TABLET | ORAL | 2 refills | Status: DC

## 2016-09-26 ENCOUNTER — Other Ambulatory Visit (HOSPITAL_BASED_OUTPATIENT_CLINIC_OR_DEPARTMENT_OTHER): Payer: BC Managed Care – PPO

## 2016-09-26 DIAGNOSIS — I2699 Other pulmonary embolism without acute cor pulmonale: Secondary | ICD-10-CM

## 2016-09-26 DIAGNOSIS — Z86718 Personal history of other venous thrombosis and embolism: Secondary | ICD-10-CM

## 2016-09-26 DIAGNOSIS — Z86711 Personal history of pulmonary embolism: Secondary | ICD-10-CM | POA: Diagnosis not present

## 2016-09-26 LAB — PROTIME-INR
INR: 1.8 — ABNORMAL LOW (ref 2.00–3.50)
Protime: 21.6 Seconds — ABNORMAL HIGH (ref 10.6–13.4)

## 2016-09-27 ENCOUNTER — Telehealth: Payer: Self-pay | Admitting: *Deleted

## 2016-09-27 NOTE — Telephone Encounter (Signed)
LM for patient to call dr Ernestina Penna nurse re: coumadin and lab appt

## 2016-09-27 NOTE — Telephone Encounter (Signed)
LM for rtn call to discuss lab results

## 2016-09-27 NOTE — Telephone Encounter (Signed)
-----   Message from Truitt Merle, MD sent at 09/26/2016 10:53 PM EST ----- Let pt know the PT/INR result from today. OK to continue current coumadin dose, and repeat lab in 4 weeks.  Truitt Merle  09/26/2016

## 2016-09-29 NOTE — Telephone Encounter (Signed)
RN spoke with patient and informed her of results and instructions from MD [facility]. Patient verbalized understanding.

## 2016-10-18 ENCOUNTER — Other Ambulatory Visit: Payer: BC Managed Care – PPO

## 2016-10-18 ENCOUNTER — Ambulatory Visit: Payer: BC Managed Care – PPO | Admitting: Hematology

## 2016-10-24 ENCOUNTER — Other Ambulatory Visit (HOSPITAL_BASED_OUTPATIENT_CLINIC_OR_DEPARTMENT_OTHER): Payer: BC Managed Care – PPO

## 2016-10-24 DIAGNOSIS — Z86711 Personal history of pulmonary embolism: Secondary | ICD-10-CM | POA: Diagnosis not present

## 2016-10-24 DIAGNOSIS — I2699 Other pulmonary embolism without acute cor pulmonale: Secondary | ICD-10-CM

## 2016-10-24 DIAGNOSIS — Z86718 Personal history of other venous thrombosis and embolism: Secondary | ICD-10-CM

## 2016-10-24 LAB — CBC WITH DIFFERENTIAL/PLATELET
BASO%: 0.5 % (ref 0.0–2.0)
Basophils Absolute: 0 10*3/uL (ref 0.0–0.1)
EOS%: 2.3 % (ref 0.0–7.0)
Eosinophils Absolute: 0.2 10*3/uL (ref 0.0–0.5)
HCT: 37.9 % (ref 34.8–46.6)
HGB: 12.2 g/dL (ref 11.6–15.9)
LYMPH%: 31.3 % (ref 14.0–49.7)
MCH: 27.5 pg (ref 25.1–34.0)
MCHC: 32.3 g/dL (ref 31.5–36.0)
MCV: 85.2 fL (ref 79.5–101.0)
MONO#: 0.6 10*3/uL (ref 0.1–0.9)
MONO%: 9.5 % (ref 0.0–14.0)
NEUT#: 3.8 10*3/uL (ref 1.5–6.5)
NEUT%: 56.4 % (ref 38.4–76.8)
Platelets: 255 10*3/uL (ref 145–400)
RBC: 4.44 10*6/uL (ref 3.70–5.45)
RDW: 14.5 % (ref 11.2–14.5)
WBC: 6.7 10*3/uL (ref 3.9–10.3)
lymph#: 2.1 10*3/uL (ref 0.9–3.3)

## 2016-10-24 LAB — COMPREHENSIVE METABOLIC PANEL
ALT: 23 U/L (ref 0–55)
AST: 15 U/L (ref 5–34)
Albumin: 3.3 g/dL — ABNORMAL LOW (ref 3.5–5.0)
Alkaline Phosphatase: 117 U/L (ref 40–150)
Anion Gap: 8 mEq/L (ref 3–11)
BUN: 10.5 mg/dL (ref 7.0–26.0)
CO2: 24 mEq/L (ref 22–29)
Calcium: 9.2 mg/dL (ref 8.4–10.4)
Chloride: 107 mEq/L (ref 98–109)
Creatinine: 0.7 mg/dL (ref 0.6–1.1)
EGFR: >90 mL/min/{1.73_m2} (ref >90)
Glucose: 72 mg/dl (ref 70–140)
Potassium: 3.9 mEq/L (ref 3.5–5.1)
Sodium: 139 mEq/L (ref 136–145)
Total Bilirubin: 0.29 mg/dL (ref 0.20–1.20)
Total Protein: 7.4 g/dL (ref 6.4–8.3)

## 2016-10-24 LAB — PROTIME-INR
INR: 1.5 — ABNORMAL LOW (ref 2.00–3.50)
Protime: 18 Seconds — ABNORMAL HIGH (ref 10.6–13.4)

## 2016-10-26 ENCOUNTER — Telehealth: Payer: Self-pay | Admitting: *Deleted

## 2016-10-26 ENCOUNTER — Telehealth: Payer: Self-pay | Admitting: Hematology

## 2016-10-26 ENCOUNTER — Other Ambulatory Visit: Payer: Self-pay | Admitting: *Deleted

## 2016-10-26 DIAGNOSIS — I82409 Acute embolism and thrombosis of unspecified deep veins of unspecified lower extremity: Secondary | ICD-10-CM

## 2016-10-26 NOTE — Telephone Encounter (Signed)
Spoke with patient re 4/10 lab.

## 2016-10-26 NOTE — Telephone Encounter (Signed)
Spoke with patient.  She validates that she is taking her coumadin everyday as prescribed.  Mon- Wed-Fri is 5mg  and 7.5mg  the rest of the days.  She denies any new medications and no antibiotics. Per Dr. Burr Medico.   Patient is to change coumadin dosing and take 5mg  on Monday only and 7.5mg  the rest of the week.  We will check her labs in two weeks.  Patient is agreeable with this plan. All questions answered.  Patient knows to expect call for appt. In two weeks for lab work to check coumadin.

## 2016-11-07 ENCOUNTER — Other Ambulatory Visit: Payer: BC Managed Care – PPO

## 2016-11-10 ENCOUNTER — Other Ambulatory Visit (HOSPITAL_BASED_OUTPATIENT_CLINIC_OR_DEPARTMENT_OTHER): Payer: BC Managed Care – PPO

## 2016-11-10 DIAGNOSIS — Z86718 Personal history of other venous thrombosis and embolism: Secondary | ICD-10-CM

## 2016-11-10 DIAGNOSIS — I82409 Acute embolism and thrombosis of unspecified deep veins of unspecified lower extremity: Secondary | ICD-10-CM

## 2016-11-10 DIAGNOSIS — Z86711 Personal history of pulmonary embolism: Secondary | ICD-10-CM

## 2016-11-10 LAB — PROTIME-INR
INR: 1.8 — ABNORMAL LOW (ref 2.00–3.50)
Protime: 21.6 Seconds — ABNORMAL HIGH (ref 10.6–13.4)

## 2016-11-13 ENCOUNTER — Telehealth: Payer: Self-pay | Admitting: *Deleted

## 2016-11-13 NOTE — Telephone Encounter (Signed)
-----   Message from Truitt Merle, MD sent at 11/13/2016 11:01 AM EDT -----   ----- Message ----- From: Truitt Merle, MD Sent: 11/12/2016   4:20 PM To: Arlice Colt [facility] [address]  Please call pt and find out her current coumadin dose and let me know, will decide if she needs to increase dose slightly.  Thanks  Truitt Merle  11/12/2016

## 2016-11-13 NOTE — Telephone Encounter (Signed)
Called pt and left message on voice mail requesting a call back - what coumadin dose pt is currently taking - so Dr. Burr Medico can adjust dosage accordingly based on last lab results.

## 2016-11-21 ENCOUNTER — Other Ambulatory Visit: Payer: BC Managed Care – PPO

## 2016-12-26 ENCOUNTER — Other Ambulatory Visit: Payer: BC Managed Care – PPO

## 2016-12-27 ENCOUNTER — Other Ambulatory Visit (HOSPITAL_BASED_OUTPATIENT_CLINIC_OR_DEPARTMENT_OTHER): Payer: BC Managed Care – PPO

## 2016-12-27 DIAGNOSIS — Z86718 Personal history of other venous thrombosis and embolism: Secondary | ICD-10-CM | POA: Diagnosis not present

## 2016-12-27 DIAGNOSIS — Z86711 Personal history of pulmonary embolism: Secondary | ICD-10-CM

## 2016-12-27 DIAGNOSIS — I2699 Other pulmonary embolism without acute cor pulmonale: Secondary | ICD-10-CM

## 2016-12-27 LAB — PROTIME-INR
INR: 2.3 (ref 2.00–3.50)
Protime: 27.6 Seconds — ABNORMAL HIGH (ref 10.6–13.4)

## 2017-01-23 ENCOUNTER — Telehealth: Payer: Self-pay | Admitting: *Deleted

## 2017-01-23 ENCOUNTER — Other Ambulatory Visit (HOSPITAL_BASED_OUTPATIENT_CLINIC_OR_DEPARTMENT_OTHER): Payer: BC Managed Care – PPO

## 2017-01-23 DIAGNOSIS — Z86711 Personal history of pulmonary embolism: Secondary | ICD-10-CM | POA: Diagnosis not present

## 2017-01-23 DIAGNOSIS — I2699 Other pulmonary embolism without acute cor pulmonale: Secondary | ICD-10-CM

## 2017-01-23 DIAGNOSIS — Z86718 Personal history of other venous thrombosis and embolism: Secondary | ICD-10-CM

## 2017-01-23 LAB — CBC WITH DIFFERENTIAL/PLATELET
BASO%: 0.6 % (ref 0.0–2.0)
Basophils Absolute: 0 10*3/uL (ref 0.0–0.1)
EOS%: 0.3 % (ref 0.0–7.0)
Eosinophils Absolute: 0 10*3/uL (ref 0.0–0.5)
HCT: 41.4 % (ref 34.8–46.6)
HGB: 13.5 g/dL (ref 11.6–15.9)
LYMPH%: 40.2 % (ref 14.0–49.7)
MCH: 28 pg (ref 25.1–34.0)
MCHC: 32.6 g/dL (ref 31.5–36.0)
MCV: 85.9 fL (ref 79.5–101.0)
MONO#: 0.4 10*3/uL (ref 0.1–0.9)
MONO%: 10.1 % (ref 0.0–14.0)
NEUT#: 1.7 10*3/uL (ref 1.5–6.5)
NEUT%: 48.8 % (ref 38.4–76.8)
Platelets: 218 10*3/uL (ref 145–400)
RBC: 4.82 10*6/uL (ref 3.70–5.45)
RDW: 14.3 % (ref 11.2–14.5)
WBC: 3.6 10*3/uL — ABNORMAL LOW (ref 3.9–10.3)
lymph#: 1.4 10*3/uL (ref 0.9–3.3)
nRBC: 0 % (ref 0–0)

## 2017-01-23 LAB — PROTIME-INR
INR: 1.6 — ABNORMAL LOW (ref 2.00–3.50)
Protime: 19.2 Seconds — ABNORMAL HIGH (ref 10.6–13.4)

## 2017-01-23 NOTE — Telephone Encounter (Signed)
Left message for pt to return call tomorrow to verify coumadin dose that she is presently taking.

## 2017-01-24 ENCOUNTER — Telehealth: Payer: Self-pay | Admitting: *Deleted

## 2017-01-24 NOTE — Telephone Encounter (Signed)
Called pt back with no answer.  Left message on voice mail re :  Per Dr. Benay Spice,  Pt to take Coumadin 7.5 mg daily.  Recheck INR in 2 weeks.  Asked pt to call nurse back to confirm message and to schedule lab.

## 2017-01-24 NOTE — Telephone Encounter (Signed)
Spoke with pt and was informed that pt is taking  Coumadin  7.5 mg every day except  5 mg on Monday.  Denied bleeding nor bruising.   Stated she was prescribed diuretic by ENT provider - pt did not remember name of med.   Stated it might affect her INR level.   Pt understood she will be contacted with further instructions.

## 2017-01-26 ENCOUNTER — Telehealth: Payer: Self-pay | Admitting: *Deleted

## 2017-01-26 ENCOUNTER — Other Ambulatory Visit: Payer: Self-pay | Admitting: *Deleted

## 2017-01-26 MED ORDER — WARFARIN SODIUM 7.5 MG PO TABS: 7.5000 mg | ORAL_TABLET | Freq: Every day | ORAL | 1 refills | Status: DC

## 2017-01-26 NOTE — Telephone Encounter (Signed)
Pt is stating she needs refill on her 7.5 mg warfarin. She was wanting to make sure pharmacy is aware it is the 7.5 mg tab she is taking.

## 2017-01-26 NOTE — Telephone Encounter (Signed)
Received call from pt stating that she is taking coumadin 7.5 mg daily.  She did received message to do so & will put in LOS for labs in @ 2 wks to repeat PT/INR.  Pt states ph # listed is best # to reach her.

## 2017-01-29 ENCOUNTER — Telehealth: Payer: Self-pay | Admitting: Hematology

## 2017-01-29 NOTE — Telephone Encounter (Signed)
sw pt to confirm 7/9 lab appt at 1300 per sch msg

## 2017-02-02 ENCOUNTER — Telehealth: Payer: Self-pay | Admitting: *Deleted

## 2017-02-02 NOTE — Telephone Encounter (Signed)
TCT patient to confirm with her that her warfarin dosage/prescription is correct.  Spoke with pt. She states her medication is correct.  Pt has lab appt on 02/05/17 @ 1pm Pt is aware and will be here for that.

## 2017-02-05 ENCOUNTER — Other Ambulatory Visit (HOSPITAL_BASED_OUTPATIENT_CLINIC_OR_DEPARTMENT_OTHER): Payer: BC Managed Care – PPO

## 2017-02-05 DIAGNOSIS — Z86711 Personal history of pulmonary embolism: Secondary | ICD-10-CM | POA: Diagnosis not present

## 2017-02-05 DIAGNOSIS — Z86718 Personal history of other venous thrombosis and embolism: Secondary | ICD-10-CM | POA: Diagnosis not present

## 2017-02-05 DIAGNOSIS — I2699 Other pulmonary embolism without acute cor pulmonale: Secondary | ICD-10-CM

## 2017-02-05 LAB — PROTIME-INR
INR: 3.1 (ref 2.00–3.50)
Protime: 37.2 Seconds — ABNORMAL HIGH (ref 10.6–13.4)

## 2017-02-13 ENCOUNTER — Telehealth: Payer: Self-pay | Admitting: *Deleted

## 2017-02-13 NOTE — Telephone Encounter (Signed)
Called pt and left message on voice mail re:  Based on lab results on 02/05/17  PT  34.2  ;  INR  3.10 ;  Pt to continue with current dose of Coumadin 7.5 mg daily as per Dr. Burr Medico.  Pt already has lab and next office visit appts scheduled for  02/27/17.

## 2017-02-20 ENCOUNTER — Telehealth: Payer: Self-pay | Admitting: Hematology

## 2017-02-20 NOTE — Telephone Encounter (Signed)
R/s appt per sch message from Dr. Burr Medico - left message with appt date and time and sent reminder letter.

## 2017-02-27 ENCOUNTER — Ambulatory Visit: Payer: BC Managed Care – PPO | Admitting: Hematology

## 2017-02-27 ENCOUNTER — Other Ambulatory Visit: Payer: BC Managed Care – PPO

## 2017-03-08 ENCOUNTER — Encounter: Payer: Self-pay | Admitting: Hematology

## 2017-03-08 ENCOUNTER — Telehealth: Payer: Self-pay | Admitting: Hematology

## 2017-03-08 ENCOUNTER — Other Ambulatory Visit (HOSPITAL_BASED_OUTPATIENT_CLINIC_OR_DEPARTMENT_OTHER): Payer: Self-pay

## 2017-03-08 ENCOUNTER — Ambulatory Visit (HOSPITAL_BASED_OUTPATIENT_CLINIC_OR_DEPARTMENT_OTHER): Payer: Self-pay | Admitting: Hematology

## 2017-03-08 VITALS — BP 132/69 | HR 81 | Temp 99.0°F | Resp 17 | Ht 68.0 in | Wt 226.4 lb

## 2017-03-08 DIAGNOSIS — Z86711 Personal history of pulmonary embolism: Secondary | ICD-10-CM

## 2017-03-08 DIAGNOSIS — Z7901 Long term (current) use of anticoagulants: Secondary | ICD-10-CM

## 2017-03-08 DIAGNOSIS — Z86718 Personal history of other venous thrombosis and embolism: Secondary | ICD-10-CM

## 2017-03-08 DIAGNOSIS — I2699 Other pulmonary embolism without acute cor pulmonale: Secondary | ICD-10-CM

## 2017-03-08 DIAGNOSIS — E669 Obesity, unspecified: Secondary | ICD-10-CM

## 2017-03-08 DIAGNOSIS — R42 Dizziness and giddiness: Secondary | ICD-10-CM

## 2017-03-08 DIAGNOSIS — R739 Hyperglycemia, unspecified: Secondary | ICD-10-CM

## 2017-03-08 LAB — PROTIME-INR
INR: 4.4 — ABNORMAL HIGH (ref 2.00–3.50)
Protime: 52.8 Seconds — ABNORMAL HIGH (ref 10.6–13.4)

## 2017-03-08 NOTE — Telephone Encounter (Signed)
Scheduled appt per 8/9 los - Gave patient AVS and calender per los.  

## 2017-03-08 NOTE — Progress Notes (Signed)
[place] [place]  [phone] [phone]) [phone]  Clinic Follow up Note   Patient Care Team: Lucianne Lei, MD as PCP - General (Family Medicine) 03/08/2017  DIAGNOSIS:  1. Pulmonary embolism, acute bilateral with high clot burden in association with an asymptomatic DVT of the right lower leg  diagnosed on 06/07/2012. She was treated with Xarelto (rivaroxaban). Doppler study of the right leg carried out on 08/20/2012 and CT angiogram of the chest carried out on 08/23/2012 showed resolution of previous blood clots. Hypercoagulable workup carried out on 06/08/2012 was negative  2. DVT involving the right lower extremity, asymptomatic at presentation with clots seen in the right distal popliteal, peroneal, and posterior tibial veins on Doppler study from 06/08/2012.  3. History of pulmonary embolism in association with birth control pills in 2001.  4. Unprovoked bilateral PE on 05/18/2016  CURRENT THERAPY: coumadin  7.5mg  daily, will change to 7.5mg  daily except 5mg  on Mondays and Fridays starting 03/09/2017  INTERVAL HISTORY: [name] returns for follow up. She has been on 7.5mg  Coumadin daily and denies any other problems or bleeding. She has started taking Maxzide for about 3 months for dizziness.   REVIEW OF SYSTEMS:   Constitutional: Denies fevers, chills or abnormal weight loss, (+)occasional dizziness  Eyes: Denies blurriness of vision Ears, nose, mouth, throat, and face: Denies mucositis or sore throat Respiratory: see HPI  Cardiovascular: Denies palpitation, chest discomfort or lower extremity swelling Gastrointestinal:  Denies nausea, heartburn or change in bowel habits Skin: Denies abnormal skin rashes Lymphatics: Denies new lymphadenopathy or easy bruising Neurological:Denies numbness, tingling or new weaknesses Behavioral/Psych: Mood is stable, no new changes  All other systems were reviewed with the patient and are negative.  MEDICAL HISTORY:    Past Medical History:  Diagnosis Date  . Pulmonary embolism, blood-clot, obstetric 2002   on birth control-pulm blood clot-treated with coumadin-no problems now    SURGICAL HISTORY: Past Surgical History:  Procedure Laterality Date  . IUD REMOVAL  OCT 2012  . LAPAROSCOPIC TUBAL LIGATION  07/14/2011   Procedure: LAPAROSCOPIC TUBAL LIGATION;  Surgeon: Agnes Lawrence, MD;  Location: [facility];  Service: Gynecology;  Laterality: N/A;  . NO PAST SURGERIES    . TUBAL LIGATION  04/21/2011   Procedure: ESSURE TUBAL STERILIZATION;  Surgeon: Agnes Lawrence, MD;  Location: [facility];  Service: Gynecology;  Laterality: Bilateral;   attempted essure,removal of IUD, hysteroscopy,         dilatation and currettage  . vaginal ablasion      I have reviewed the social history and family history with the patient and they are unchanged from previous note.  ALLERGIES:  is allergic to sulfa antibiotics.  MEDICATIONS:  Current Outpatient Prescriptions  Medication Sig Dispense Refill  . acetaminophen (TYLENOL) 500 MG tablet Take by mouth as needed.    . naproxen sodium (ANAPROX) 220 MG tablet Take 220 mg by mouth 2 (two) times daily as needed (for pain). Reported on 10/07/2015    . omeprazole (PRILOSEC) 20 MG capsule Take 20 mg by mouth daily.    Marland Kitchen triamterene-hydrochlorothiazide (MAXZIDE-25) 37.5-25 MG tablet Take 1 tablet by mouth daily.    Marland Kitchen warfarin (COUMADIN) 7.5 MG tablet Take 1 tablet (7.5 mg total) by mouth daily. 01/26/17 changed to 7.5 mg daily. 90 tablet 1  . warfarin (COUMADIN) 5 MG tablet Take 1 tablet (5 mg total) by mouth as directed. (Patient not taking: Reported on 03/08/2017) 40 tablet 2   No current facility-administered medications for this  visit.     PHYSICAL EXAMINATION:  ECOG PERFORMANCE STATUS: 1  Vitals:   03/08/17 1549  BP: 132/69  Pulse: 81  Resp: 17  Temp: 99 F (37.2 C)  SpO2: 100%   Filed Weights   03/08/17 1549  Weight: 226 lb 6.4 oz (102.7 kg)    GENERAL:alert, no distress and comfortable SKIN: skin color, texture, turgor are normal, no rashes or significant lesions EYES: normal, Conjunctiva are pink and non-injected, sclera clear OROPHARYNX:no exudate, no erythema and lips, buccal mucosa, and tongue normal  NECK: supple, thyroid normal size, non-tender, without nodularity LYMPH:  no palpable lymphadenopathy in the cervical, axillary or inguinal LUNGS: clear to auscultation and percussion with normal breathing effort HEART: regular rate & rhythm and no murmurs and no lower extremity edema ABDOMEN:abdomen soft, non-tender and normal bowel sounds Musculoskeletal:no cyanosis of digits and no clubbing  NEURO: alert & oriented x 3 with fluent speech, no focal motor/sensory deficits (+) dizziness, headaches.   LABORATORY DATA:  I have reviewed the data as listed CBC Latest Ref Rng & Units 01/23/2017 10/24/2016 08/29/2016  WBC 3.9 - 10.3 10e3/uL 3.6(L) 6.7 6.1  Hemoglobin 11.6 - 15.9 g/dL 13.5 12.2 11.6  Hematocrit 34.8 - 46.6 % 41.4 37.9 35.4  Platelets 145 - 400 10e3/uL 218 255 244     CMP Latest Ref Rng & Units 10/24/2016 07/14/2016 05/23/2016  Glucose 70 - 140 mg/dl 72 108(H) 90  BUN 7.0 - 26.0 mg/dL 10.5 14 13   Creatinine 0.6 - 1.1 mg/dL 0.7 0.78 0.81  Sodium 136 - 145 mEq/L 139 134(L) 139  Potassium 3.5 - 5.1 mEq/L 3.9 3.4(L) 3.5  Chloride 101 - 111 mmol/L - 107 108  CO2 22 - 29 mEq/L 24 24 22   Calcium 8.4 - 10.4 mg/dL 9.2 8.3(L) 8.5(L)  Total Protein 6.4 - 8.3 g/dL 7.4 - -  Total Bilirubin 0.20 - 1.20 mg/dL 0.29 - -  Alkaline Phos 40 - 150 U/L 117 - -  AST 5 - 34 U/L 15 - -  ALT 0 - 55 U/L 23 - -   Results for LORELLE, MACALUSO (MRN [id number]) as of 03/08/2017 16:25  Ref. Range 01/23/2017 16:09 02/05/2017 13:03 03/08/2017 15:15  Protime Latest Ref Range: 10.6 - 13.4 Seconds 19.2 (H) 37.2 (H) 52.8 (H)  INR Latest Ref Range: 2.00 - 3.50  1.60 (L) 3.10 4.40 (H)    RADIOGRAPHIC STUDIES: I have personally reviewed the  radiological images as listed and agreed with the findings in the report.  MRI MRA Head 07/14/2016 IMPRESSION: Negative MRI head  Negative MRA head  CT Head 07/14/2016 IMPRESSION: No acute intracranial pathology.  ASSESSMENT & PLAN:  51 y.o. female, with history of one episode of PE when she was on birth control pill, and second episode of unprovoked PE and DVT. She lost to follow-up subsequently. She presented with unprovoked PE again when she was on Xarelto in 04/2016  1. Recurrent PE and DVT -She had a negative hypercoagulation workup -Giving the recurrent pulmonary and reason, especially second episode was unprovoked, young age, and low risk of bleeding, I recommend her to restart prophylactic anticoagulation indefinitely. She agrees. -She developed unprovoked bilateral PE when she was on Xarelto 20 mg daily in 04/2016.  -Her dyspnea on exertion and hypoxia has previously resolved -She is on Coumadin  7.5 mg daily now, INR super-therapeutic, 4.40 today, goal is 2.0-3.0. She will change to 7.5mg  daily except 5mg  on Mondays and Fridays  -She preferred to follow-up with [place] for  her anticoagulation management.  2. Obesity, hyperglycemia  -she recently started on Janumet by her PCP  -I encouraged her eat healthy, and exercise regularly.  3. Intermittent dizziness -She'll follow-up with her primary care physician  Plan -change Coumadin to 7.5 mg daily except 5mg  on Mondays and Fridays  -PT/INR in 2 and 4 weeks then every 4 weeks.  -I'll see her back in 6 months  All questions were answered. The patient knows to call the clinic with any problems, questions or concerns. No barriers to learning was detected.  I spent 15 minutes counseling the patient face to face. The total time spent in the appointment was 20 minutes and more than 50% was on counseling and review of test results  This document serves as a record of services personally performed by Truitt Merle, MD. It was created on  her behalf by Brandt Loosen, a trained medical scribe. The creation of this record is based on the scribe's personal observations and the provider's statements to them. This document has been checked and approved by the attending provider.   I have reviewed the above documentation for accuracy and completeness and I agree with the above.   Truitt Merle 03/08/2017

## 2017-03-22 ENCOUNTER — Other Ambulatory Visit: Payer: Self-pay

## 2017-03-29 ENCOUNTER — Other Ambulatory Visit: Payer: Self-pay

## 2017-03-30 ENCOUNTER — Other Ambulatory Visit (HOSPITAL_BASED_OUTPATIENT_CLINIC_OR_DEPARTMENT_OTHER): Payer: Self-pay

## 2017-03-30 DIAGNOSIS — Z7901 Long term (current) use of anticoagulants: Secondary | ICD-10-CM

## 2017-03-30 DIAGNOSIS — Z86711 Personal history of pulmonary embolism: Secondary | ICD-10-CM

## 2017-03-30 DIAGNOSIS — I2699 Other pulmonary embolism without acute cor pulmonale: Secondary | ICD-10-CM

## 2017-03-30 DIAGNOSIS — Z86718 Personal history of other venous thrombosis and embolism: Secondary | ICD-10-CM

## 2017-03-30 LAB — CBC WITH DIFFERENTIAL/PLATELET
BASO%: 0.5 % (ref 0.0–2.0)
Basophils Absolute: 0 10*3/uL (ref 0.0–0.1)
EOS%: 1.3 % (ref 0.0–7.0)
Eosinophils Absolute: 0.1 10*3/uL (ref 0.0–0.5)
HCT: 36 % (ref 34.8–46.6)
HGB: 11.7 g/dL (ref 11.6–15.9)
LYMPH%: 26.2 % (ref 14.0–49.7)
MCH: 28.9 pg (ref 25.1–34.0)
MCHC: 32.5 g/dL (ref 31.5–36.0)
MCV: 88.9 fL (ref 79.5–101.0)
MONO#: 0.4 10*3/uL (ref 0.1–0.9)
MONO%: 4.5 % (ref 0.0–14.0)
NEUT#: 5.7 10*3/uL (ref 1.5–6.5)
NEUT%: 67.5 % (ref 38.4–76.8)
Platelets: 259 10*3/uL (ref 145–400)
RBC: 4.05 10*6/uL (ref 3.70–5.45)
RDW: 15 % — ABNORMAL HIGH (ref 11.2–14.5)
WBC: 8.4 10*3/uL (ref 3.9–10.3)
lymph#: 2.2 10*3/uL (ref 0.9–3.3)
nRBC: 0 % (ref 0–0)

## 2017-03-30 LAB — PROTIME-INR
INR: 2.7 (ref 2.00–3.50)
Protime: 32.4 Seconds — ABNORMAL HIGH (ref 10.6–13.4)

## 2017-04-03 ENCOUNTER — Telehealth: Payer: Self-pay | Admitting: *Deleted

## 2017-04-03 NOTE — Telephone Encounter (Signed)
Message left for pt to call back to verify that she got message to call back regarding no change in coumadin dose.  She should be on  7.5 mg daily except 5 mg on Monday & Friday.

## 2017-04-05 ENCOUNTER — Other Ambulatory Visit: Payer: Self-pay

## 2017-04-12 ENCOUNTER — Other Ambulatory Visit: Payer: Self-pay

## 2017-04-19 ENCOUNTER — Other Ambulatory Visit (HOSPITAL_BASED_OUTPATIENT_CLINIC_OR_DEPARTMENT_OTHER): Payer: Self-pay

## 2017-04-19 DIAGNOSIS — Z86711 Personal history of pulmonary embolism: Secondary | ICD-10-CM

## 2017-04-19 DIAGNOSIS — Z86718 Personal history of other venous thrombosis and embolism: Secondary | ICD-10-CM

## 2017-04-19 DIAGNOSIS — I2699 Other pulmonary embolism without acute cor pulmonale: Secondary | ICD-10-CM

## 2017-04-19 LAB — COMPREHENSIVE METABOLIC PANEL
ALT: 23 U/L (ref 0–55)
AST: 22 U/L (ref 5–34)
Albumin: 3.4 g/dL — ABNORMAL LOW (ref 3.5–5.0)
Alkaline Phosphatase: 114 U/L (ref 40–150)
Anion Gap: 8 mEq/L (ref 3–11)
BUN: 12.9 mg/dL (ref 7.0–26.0)
CO2: 26 mEq/L (ref 22–29)
Calcium: 9.4 mg/dL (ref 8.4–10.4)
Chloride: 108 mEq/L (ref 98–109)
Creatinine: 0.9 mg/dL (ref 0.6–1.1)
EGFR: 90 mL/min/{1.73_m2} — ABNORMAL LOW (ref >90)
Glucose: 98 mg/dl (ref 70–140)
Potassium: 3.7 mEq/L (ref 3.5–5.1)
Sodium: 141 mEq/L (ref 136–145)
Total Bilirubin: 0.22 mg/dL (ref 0.20–1.20)
Total Protein: 7.6 g/dL (ref 6.4–8.3)

## 2017-04-19 LAB — PROTIME-INR
INR: 3.1 (ref 2.00–3.50)
Protime: 37.2 Seconds — ABNORMAL HIGH (ref 10.6–13.4)

## 2017-04-20 ENCOUNTER — Telehealth: Payer: Self-pay | Admitting: *Deleted

## 2017-04-20 NOTE — Telephone Encounter (Signed)
Pt informed to continue dosage of coumadin.  She will refill at next appt.Marland Kitchen

## 2017-04-26 ENCOUNTER — Other Ambulatory Visit: Payer: Self-pay

## 2017-05-03 ENCOUNTER — Other Ambulatory Visit (HOSPITAL_BASED_OUTPATIENT_CLINIC_OR_DEPARTMENT_OTHER): Payer: Self-pay

## 2017-05-03 DIAGNOSIS — Z86711 Personal history of pulmonary embolism: Secondary | ICD-10-CM

## 2017-05-03 DIAGNOSIS — I2699 Other pulmonary embolism without acute cor pulmonale: Secondary | ICD-10-CM

## 2017-05-03 DIAGNOSIS — Z86718 Personal history of other venous thrombosis and embolism: Secondary | ICD-10-CM

## 2017-05-03 LAB — PROTIME-INR
INR: 3 (ref 2.00–3.50)
Protime: 36 Seconds — ABNORMAL HIGH (ref 10.6–13.4)

## 2017-05-10 ENCOUNTER — Other Ambulatory Visit: Payer: Self-pay

## 2017-06-13 ENCOUNTER — Telehealth: Payer: Self-pay | Admitting: Hematology

## 2017-06-13 NOTE — Telephone Encounter (Signed)
Spoke with patient and scheduled her 3 month followup.

## 2017-06-18 ENCOUNTER — Other Ambulatory Visit (HOSPITAL_BASED_OUTPATIENT_CLINIC_OR_DEPARTMENT_OTHER): Payer: Self-pay

## 2017-06-18 ENCOUNTER — Ambulatory Visit (HOSPITAL_BASED_OUTPATIENT_CLINIC_OR_DEPARTMENT_OTHER): Payer: Self-pay | Admitting: Hematology

## 2017-06-18 ENCOUNTER — Telehealth: Payer: Self-pay | Admitting: Hematology

## 2017-06-18 ENCOUNTER — Encounter: Payer: Self-pay | Admitting: Hematology

## 2017-06-18 VITALS — BP 115/52 | HR 65 | Temp 98.0°F | Resp 17 | Ht 68.0 in | Wt 232.3 lb

## 2017-06-18 DIAGNOSIS — Z7901 Long term (current) use of anticoagulants: Secondary | ICD-10-CM

## 2017-06-18 DIAGNOSIS — Z86711 Personal history of pulmonary embolism: Secondary | ICD-10-CM

## 2017-06-18 DIAGNOSIS — R739 Hyperglycemia, unspecified: Secondary | ICD-10-CM

## 2017-06-18 DIAGNOSIS — I2699 Other pulmonary embolism without acute cor pulmonale: Secondary | ICD-10-CM

## 2017-06-18 DIAGNOSIS — R42 Dizziness and giddiness: Secondary | ICD-10-CM

## 2017-06-18 DIAGNOSIS — Z86718 Personal history of other venous thrombosis and embolism: Secondary | ICD-10-CM

## 2017-06-18 DIAGNOSIS — E669 Obesity, unspecified: Secondary | ICD-10-CM

## 2017-06-18 LAB — PROTIME-INR
INR: 2.9 (ref 2.00–3.50)
Protime: 34.8 Seconds — ABNORMAL HIGH (ref 10.6–13.4)

## 2017-06-18 MED ORDER — WARFARIN SODIUM 5 MG PO TABS: 5.0000 mg | ORAL_TABLET | ORAL | 3 refills | Status: DC

## 2017-06-18 MED ORDER — WARFARIN SODIUM 7.5 MG PO TABS: 7.5000 mg | ORAL_TABLET | Freq: Every day | ORAL | 3 refills | Status: DC

## 2017-06-18 NOTE — Telephone Encounter (Signed)
Gave avs and  Calendar for December - August 2019

## 2017-06-18 NOTE — Progress Notes (Signed)
[facility]  [phone]) [phone] [phone]) [phone]  Clinic Follow up Note   Patient Care Team: Lucianne Lei, MD as PCP - General (Family Medicine) 06/18/2017  DIAGNOSIS:  1. Pulmonary embolism, acute bilateral with high clot burden in association with an asymptomatic DVT of the right lower leg  diagnosed on 06/07/2012. She was treated with Xarelto (rivaroxaban). Doppler study of the right leg carried out on 08/20/2012 and CT angiogram of the chest carried out on 08/23/2012 showed resolution of previous blood clots. Hypercoagulable workup carried out on 06/08/2012 was negative  2. DVT involving the right lower extremity, asymptomatic at presentation with clots seen in the right distal popliteal, peroneal, and posterior tibial veins on Doppler study from 06/08/2012.  3. History of pulmonary embolism in association with birth control pills in 2001.  4. Unprovoked bilateral PE on 05/18/2016  CURRENT THERAPY: coumadin  7.5mg  daily, will change to 7.5mg  daily except 5mg  on Mondays and Fridays starting 03/09/2017  INTERVAL HISTORY:  [name] returns for follow up. She was last seen by me 6 months ago. She presents to the clinic today noting she is doing well over all. She has been tolerating 7.5 mg Coumadin well. She denies bleeding but previously had bruise on her finger which has resolved. She is not taking any other blood thinner. She would like to be referred to dietician for help to loose weight.     REVIEW OF SYSTEMS:  Constitutional: Denies fevers, chills or abnormal weight loss Eyes: Denies blurriness of vision Ears, nose, mouth, throat, and face: Denies mucositis or sore throat Respiratory: see HPI  Cardiovascular: Denies palpitation, chest discomfort or lower extremity swelling Gastrointestinal:  Denies nausea, heartburn or change in bowel habits Skin: Denies abnormal skin rashes Lymphatics: Denies new lymphadenopathy or easy bruising Neurological:Denies  numbness, tingling or new weaknesses Behavioral/Psych: Mood is stable, no new changes  All other systems were reviewed with the patient and are negative.  MEDICAL HISTORY:  Past Medical History:  Diagnosis Date  . Pulmonary embolism, blood-clot, obstetric 2002   on birth control-pulm blood clot-treated with coumadin-no problems now    SURGICAL HISTORY: Past Surgical History:  Procedure Laterality Date  . DILATATION & CURETTAGE/HYSTEROSCOPY WITH NOVASURE ABLATION N/A 07/14/2011   Performed by Lahoma Crocker, MD at [facility]  . ESSURE TUBAL STERILIZATION Bilateral 04/21/2011   Performed by Lahoma Crocker, MD at [facility]  . IUD REMOVAL  OCT 2012  . LAPAROSCOPIC TUBAL LIGATION N/A 07/14/2011   Performed by Lahoma Crocker, MD at [facility]  . NO PAST SURGERIES    . vaginal ablasion      I have reviewed the social history and family history with the patient and they are unchanged from previous note.  ALLERGIES:  is allergic to sulfa antibiotics.  MEDICATIONS:  Current Outpatient Medications  Medication Sig Dispense Refill  . warfarin (COUMADIN) 5 MG tablet Take 1 tablet (5 mg total) as directed by mouth. 20 tablet 3  . warfarin (COUMADIN) 7.5 MG tablet Take 1 tablet (7.5 mg total) daily by mouth. 7.5 mg daily except 5mg  daily on Fridays. 90 tablet 3  . acetaminophen (TYLENOL) 500 MG tablet Take by mouth as needed.    . naproxen sodium (ANAPROX) 220 MG tablet Take 220 mg by mouth 2 (two) times daily as needed (for pain). Reported on 10/07/2015     No current facility-administered medications for this visit.     PHYSICAL EXAMINATION:  ECOG PERFORMANCE STATUS: 1  Vitals:   06/18/17 1512  BP: (!) 115/52  Pulse: 65  Resp: 17  Temp: 98 F (36.7 C)  SpO2: 100%   Filed Weights   06/18/17 1512  Weight: 232 lb 4.8 oz (105.4 kg)     GENERAL:alert, no distress and comfortable SKIN: skin color, texture, turgor are normal, no rashes or significant lesions EYES: normal,  Conjunctiva are pink and non-injected, sclera clear OROPHARYNX:no exudate, no erythema and lips, buccal mucosa, and tongue normal  NECK: supple, thyroid normal size, non-tender, without nodularity LYMPH:  no palpable lymphadenopathy in the cervical, axillary or inguinal LUNGS: clear to auscultation and percussion with normal breathing effort HEART: regular rate & rhythm and no murmurs and no lower extremity edema ABDOMEN:abdomen soft, non-tender and normal bowel sounds Musculoskeletal:no cyanosis of digits and no clubbing  NEURO: alert & oriented x 3 with fluent speech, no focal motor/sensory deficits (+) dizziness, headaches.   LABORATORY DATA:  I have reviewed the data as listed CBC Latest Ref Rng & Units 03/30/2017 01/23/2017 10/24/2016  WBC 3.9 - 10.3 10e3/uL 8.4 3.6(L) 6.7  Hemoglobin 11.6 - 15.9 g/dL 11.7 13.5 12.2  Hematocrit 34.8 - 46.6 % 36.0 41.4 37.9  Platelets 145 - 400 10e3/uL 259 218 255     CMP Latest Ref Rng & Units 04/19/2017 10/24/2016 07/14/2016  Glucose 70 - 140 mg/dl 98 72 108(H)  BUN 7.0 - 26.0 mg/dL 12.9 10.5 14  Creatinine 0.6 - 1.1 mg/dL 0.9 0.7 0.78  Sodium 136 - 145 mEq/L 141 139 134(L)  Potassium 3.5 - 5.1 mEq/L 3.7 3.9 3.4(L)  Chloride 101 - 111 mmol/L - - 107  CO2 22 - 29 mEq/L 26 24 24   Calcium 8.4 - 10.4 mg/dL 9.4 9.2 8.3(L)  Total Protein 6.4 - 8.3 g/dL 7.6 7.4 -  Total Bilirubin 0.20 - 1.20 mg/dL 0.22 0.29 -  Alkaline Phos 40 - 150 U/L 114 117 -  AST 5 - 34 U/L 22 15 -  ALT 0 - 55 U/L 23 23 -    Results for Bianca Simmons, Bianca Simmons (MRN [id number]) as of 06/18/2017 08:53  Ref. Range 03/30/2017 16:15 04/19/2017 15:39 05/03/2017 16:05  Protime Latest Ref Range: 10.6 - 13.4 Seconds 32.4 (H) 37.2 (H) 36.0 (H)  INR Latest Ref Range: 2.00 - 3.50  2.70 3.10 3.00  PENDING 06/18/17   RADIOGRAPHIC STUDIES: I have personally reviewed the radiological images as listed and agreed with the findings in the report.  MRI MRA Head 07/14/2016 IMPRESSION: Negative MRI  head  Negative MRA head  CT Head 07/14/2016 IMPRESSION: No acute intracranial pathology.  ASSESSMENT & PLAN:  51 y.o. female, with history of one episode of PE when she was on birth control pill, and second episode of unprovoked PE and DVT. She lost to follow-up subsequently. She presented with unprovoked PE again when she was on Xarelto in 04/2016  1. Recurrent PE and DVT -She had a negative hypercoagulation workup -Giving the recurrent pulmonary and reason, especially second episode was unprovoked, young age, and low risk of bleeding, I recommend her to restart prophylactic anticoagulation indefinitely. She agrees. -She developed unprovoked bilateral PE when she was on Xarelto 20 mg daily in 04/2016.  -Her dyspnea on exertion and hypoxia has previously resolved -She is on Coumadin  7.5 mg daily now, INR super-therapeutic, 4.40 on 03/08/17, goal is 2.0-3.0. She will change to 7.5mg  daily except 5mg  on Mondays and Fridays  -She preferred to follow-up with [place] for her anticoagulation management. -Labs reviewed, CBC and CMP from 9/20 are overall normal. Today's  PT is 34.8 and INR is 2.90, within limit to continue coumadin at 7.5mg . Refilled today.  -Per pt request will send referral to dietician to discuss weight loss  -I discussed if she needs an invasive procedure she should contact us to hold her coumadin.  -F/u in 9 months    2. Obesity, hyperglycemia  -she recently started on Janumet by her PCP  -I encouraged her eat healthy, and exercise regularly.  3. Intermittent dizziness -She'll follow-up with her primary care physician  Plan -continue Coumadin 7.5mg  daily except 5mg  on Fridays, refilled today  -Lab monthly X9 -F/u in 9 months  -Dietician consult to discuss weight loss   All questions were answered. The patient knows to call the clinic with any problems, questions or concerns. No barriers to learning was detected.  I spent 15 minutes counseling the patient face to face.  The total time spent in the appointment was 20 minutes and more than 50% was on counseling and review of test results  This document serves as a record of services personally performed by Truitt Merle, MD. It was created on her behalf by Joslyn Devon, a trained medical scribe. The creation of this record is based on the scribe's personal observations and the provider's statements to them.    I have reviewed the above documentation for accuracy and completeness, and I agree with the above.   Truitt Merle 06/18/2017

## 2017-06-24 ENCOUNTER — Other Ambulatory Visit: Payer: Self-pay | Admitting: Hematology

## 2017-06-24 DIAGNOSIS — E669 Obesity, unspecified: Secondary | ICD-10-CM

## 2017-07-11 ENCOUNTER — Encounter: Payer: Self-pay | Admitting: Nutrition

## 2017-07-18 ENCOUNTER — Other Ambulatory Visit: Payer: Self-pay

## 2017-08-07 ENCOUNTER — Ambulatory Visit: Payer: Self-pay | Admitting: Dietician

## 2017-08-13 ENCOUNTER — Encounter: Payer: Self-pay | Admitting: Dietician

## 2017-08-13 ENCOUNTER — Encounter: Payer: BC Managed Care – PPO | Attending: Hematology | Admitting: Dietician

## 2017-08-13 DIAGNOSIS — E669 Obesity, unspecified: Secondary | ICD-10-CM | POA: Diagnosis present

## 2017-08-13 DIAGNOSIS — Z6835 Body mass index (BMI) 35.0-35.9, adult: Secondary | ICD-10-CM | POA: Insufficient documentation

## 2017-08-13 DIAGNOSIS — Z713 Dietary counseling and surveillance: Secondary | ICD-10-CM | POA: Insufficient documentation

## 2017-08-13 NOTE — Progress Notes (Signed)
  Medical Nutrition Therapy:  Appt start time: 1540 end time:  1640.   Assessment:  Primary concerns today: Patient is here today for weight loss.  "I can't stand this anymore and have not been able to lose weight on my own." History includes blood clots on coumadin.  Weight history: 125 lbs in 52's Gained weight with pregnancies but lost it with first 2 but not with the 3rd.  She continued to gain since that time. She followed Weight Watcher's in the past and lost the first time but not the second.   Highest weight is today's weight.  TANITA  BODY COMP RESULTS 08/13/16 231.4 lbs   BMI (kg/m^2) 35.2   Fat Mass (lbs) 117.6   Fat Free Mass (lbs) 113.8   Total Body Water (lbs) 82.8    Patient lives with her husband and 21 yo son and older son temporarily. Patient does the shopping and her husband does the cooking.  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   Preferred Learning Style:   No preference indicated   Learning Readiness:   Ready  Change in progress   MEDICATIONS: see list   DIETARY INTAKE:  Usual eating pattern includes 2-3 meals and 0-1 snacks per day. Avoided foods include pork, beef, fried foods    24-hr recall:  B ( AM): skips most often OR Jimmie Dean [place] sausage sandwich OR [employer] breakfast bar OR school breakfast (cookies today)  Snk ( AM): none  L (2PM): frozen entree (lean cuisine or [employer] or Counselling psychologist) Snk ( PM): occasional popcorn or chips D (8-10PM): chicken, salad OR Subway or pizza or Wendy's or other out to eat Snk ( PM): none Beverages: Coffee, 2 sugar, 3 creams, juice,   Usual physical activity: none  Estimated energy needs: 1400 calories 80-100 g protein  Progress Towards Goal(s):  In progress.   Nutritional Diagnosis:  NB-1.1 Food and nutrition-related knowledge deficit As related to balanced nutrition for weight loss.  As evidenced by patient report and diet hx.    Intervention:  Nutrition counseling/education related  to general nutrition, guidelines for weight loss and mindful eating.  Discussed benefits of incorporating exercise into her day as well as looking at her schedule to adjust times of meals and importance of staying hydrated.  Drink more water. Rethink your sweetened drinks.  Find ways to be more active. (walking the track after work, stationary bike) Avoid skipping meals.  Breakfast ideas:  Green smoothie  [place] sausage sandwich on thin bun, fruit  Oatmeal with nuts or boiled egg, fruit  Peanut butter toast, fruit Mindful eating: Eat slowly.  Stop when you are full.     Meal planning  [place]   Teaching Method Utilized:  Visual Auditory Hands on  Handouts given during visit include:  My plate  Label reading  Snack list  Meal plan card  Barriers to learning/adherence to lifestyle change: time  Demonstrated degree of understanding via:  Teach Back   Monitoring/Evaluation:  Dietary intake, exercise, label reading, and body weight in 1 month(s).

## 2017-08-13 NOTE — Patient Instructions (Addendum)
Drink more water. Rethink your sweetened drinks.  Find ways to be more active. (walking the track after work, stationary bike) Avoid skipping meals.  Breakfast ideas:  Green smoothie  [place] sausage sandwich on thin bun, fruit  Oatmeal with nuts or boiled egg, fruit  Peanut butter toast, fruit Mindful eating: Eat slowly.  Stop when you are full.     Meal planning  [facility] Simmons

## 2017-08-17 ENCOUNTER — Inpatient Hospital Stay: Payer: BC Managed Care – PPO | Attending: Hematology

## 2017-08-17 DIAGNOSIS — Z86718 Personal history of other venous thrombosis and embolism: Secondary | ICD-10-CM | POA: Insufficient documentation

## 2017-08-17 DIAGNOSIS — I2699 Other pulmonary embolism without acute cor pulmonale: Secondary | ICD-10-CM

## 2017-08-17 LAB — PROTIME-INR
INR: 1.85
Prothrombin Time: 21.2 seconds — ABNORMAL HIGH (ref 11.4–15.2)

## 2017-08-20 ENCOUNTER — Telehealth: Payer: Self-pay | Admitting: Hematology

## 2017-08-20 ENCOUNTER — Other Ambulatory Visit: Payer: Self-pay | Admitting: *Deleted

## 2017-08-20 DIAGNOSIS — I2699 Other pulmonary embolism without acute cor pulmonale: Secondary | ICD-10-CM

## 2017-08-20 NOTE — Telephone Encounter (Signed)
Spoke to patient regarding upcoming January appointments per 1/21 sch message.

## 2017-08-22 ENCOUNTER — Other Ambulatory Visit: Payer: BC Managed Care – PPO

## 2017-08-29 ENCOUNTER — Other Ambulatory Visit: Payer: BC Managed Care – PPO

## 2017-08-29 ENCOUNTER — Telehealth: Payer: Self-pay | Admitting: Hematology

## 2017-08-29 NOTE — Telephone Encounter (Signed)
Returned call to patient requesting that today's lab appointment be rescheduled until Friday.Appointment was moved

## 2017-08-31 ENCOUNTER — Inpatient Hospital Stay: Payer: BC Managed Care – PPO | Attending: Hematology

## 2017-08-31 DIAGNOSIS — Z86711 Personal history of pulmonary embolism: Secondary | ICD-10-CM | POA: Diagnosis not present

## 2017-08-31 DIAGNOSIS — I2699 Other pulmonary embolism without acute cor pulmonale: Secondary | ICD-10-CM

## 2017-08-31 DIAGNOSIS — Z86718 Personal history of other venous thrombosis and embolism: Secondary | ICD-10-CM | POA: Insufficient documentation

## 2017-08-31 LAB — PROTIME-INR
INR: 2.21
Prothrombin Time: 24.3 seconds — ABNORMAL HIGH (ref 11.4–15.2)

## 2017-09-03 ENCOUNTER — Telehealth: Payer: Self-pay | Admitting: *Deleted

## 2017-09-03 NOTE — Telephone Encounter (Signed)
-----   Message from Truitt Merle, MD sent at 09/01/2017  3:05 PM EST ----- Please let her know the lab results, INR therapeutic, continue current dose coumadin.   Truitt Merle  09/01/2017

## 2017-09-03 NOTE — Telephone Encounter (Signed)
Called pt and left message on voice mail requesting a call back to nurse for lab results and coumadin dose.

## 2017-09-10 ENCOUNTER — Telehealth: Payer: Self-pay | Admitting: *Deleted

## 2017-09-10 NOTE — Telephone Encounter (Signed)
Received vm call from pt stating that she is returning a call.  Returned call & given lab results per Dr Ernestina Penna instructions & informed to cont same dose of coumadin which is 7.5 mg daily except 5 mg on Friday's.  Appts for 09/13/17 cancelled per Dr Ernestina Penna last orders since lab done 08/31/17 & should be monthly.

## 2017-09-13 ENCOUNTER — Ambulatory Visit: Payer: Self-pay | Admitting: Hematology

## 2017-09-13 ENCOUNTER — Other Ambulatory Visit: Payer: Self-pay

## 2017-09-17 ENCOUNTER — Inpatient Hospital Stay: Payer: BC Managed Care – PPO

## 2017-09-17 DIAGNOSIS — Z86718 Personal history of other venous thrombosis and embolism: Secondary | ICD-10-CM

## 2017-09-17 DIAGNOSIS — I2699 Other pulmonary embolism without acute cor pulmonale: Secondary | ICD-10-CM

## 2017-09-17 LAB — CBC WITH DIFFERENTIAL/PLATELET
Basophils Absolute: 0 10*3/uL (ref 0.0–0.1)
Basophils Relative: 0 %
Eosinophils Absolute: 0.1 10*3/uL (ref 0.0–0.5)
Eosinophils Relative: 2 %
HCT: 38.6 % (ref 34.8–46.6)
Hemoglobin: 12.6 g/dL (ref 11.6–15.9)
Lymphocytes Relative: 30 %
Lymphs Abs: 2.1 10*3/uL (ref 0.9–3.3)
MCH: 29.4 pg (ref 25.1–34.0)
MCHC: 32.6 g/dL (ref 31.5–36.0)
MCV: 90.2 fL (ref 79.5–101.0)
Monocytes Absolute: 0.3 10*3/uL (ref 0.1–0.9)
Monocytes Relative: 5 %
Neutro Abs: 4.3 10*3/uL (ref 1.5–6.5)
Neutrophils Relative %: 63 %
Platelets: 216 10*3/uL (ref 145–400)
RBC: 4.28 MIL/uL (ref 3.70–5.45)
RDW: 14.7 % — ABNORMAL HIGH (ref 11.2–14.5)
WBC: 6.8 10*3/uL (ref 3.9–10.3)

## 2017-09-17 LAB — PROTIME-INR
INR: 1.55
Prothrombin Time: 18.5 seconds — ABNORMAL HIGH (ref 11.4–15.2)

## 2017-09-17 IMAGING — MR MR MRV HEAD W/O CM
12 series · 48 of 48 positions shown · non-contrast
Comparison: MRI brain today.

CLINICAL DATA: Dizziness. Blurred vision. History of PE and DVT. On
Coumadin.

EXAM:
MR VENOGRAM the HEAD WITHOUT CONTRAST
TECHNIQUE: Angiographic images of the intracranial venous structures were
obtained using MRV technique without intravenous contrast.

[Series 2: T1 · sagittal · 5.0mm · 0.45mm/px · 1 of 25 slices shown (1 of 2)]
[im 1/25]
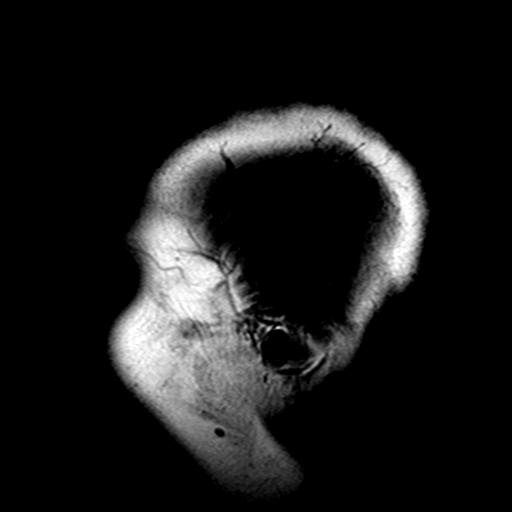

[Series 4: DWI · axial · 4.0mm · 0.94mm/px · z∈[-87,+73]mm · 4 of 41 slices shown (1 of 2)]
[im 1/41]
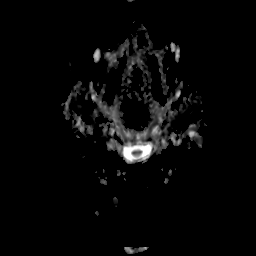
[im 14/41]
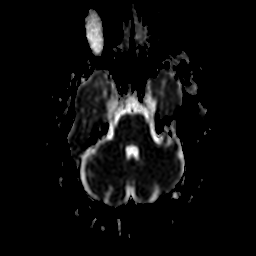
[im 27/41]
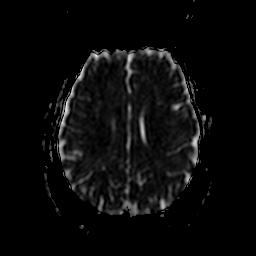
[im 41/41]
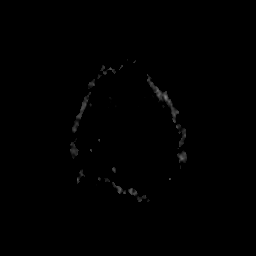

[Series 5: (id) axial · axial · 4.0mm · 0.94mm/px · z∈[-83,+73]mm · 4 of 40 slices shown]
[im 1/40]
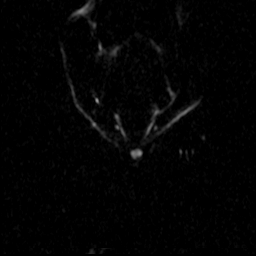
[im 14/40]
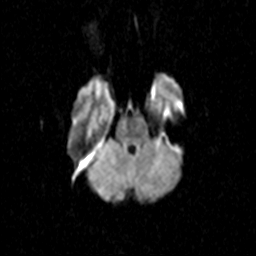
[im 27/40]
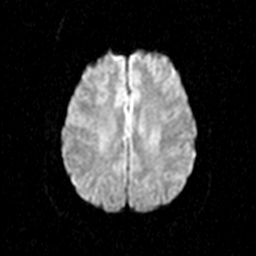
[im 40/40]
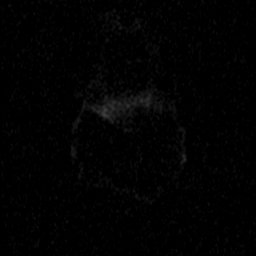

[Series 7: DWI · coronal · 5.0mm · 1.80mm/px · 3 of 35 slices shown (2 of 2)]
[im 1/35]
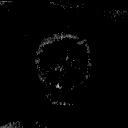
[im 18/35]
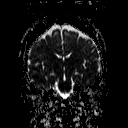
[im 35/35]
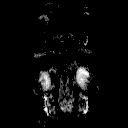

[Series 8: T2 · axial · 5.0mm · 0.45mm/px · z∈[-79,+63]mm · 2 of 23 slices shown (1 of 3)]
[im 1/23]
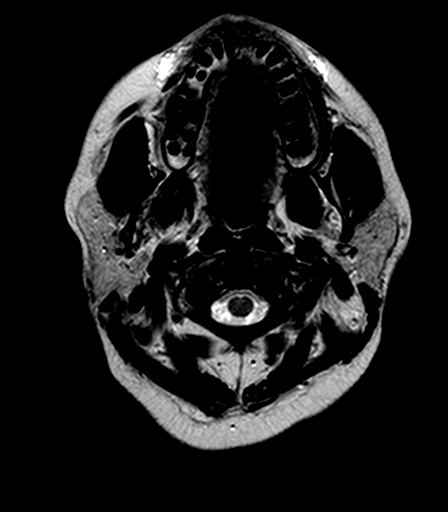
[im 23/23]
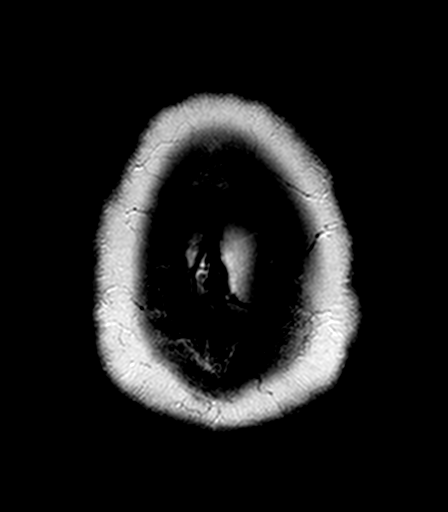

[Series 10: (id) cor · coronal · 5.0mm · 1.80mm/px · 3 of 35 slices shown]
[im 1/35]
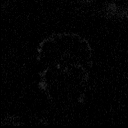
[im 18/35]
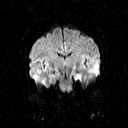
[im 35/35]
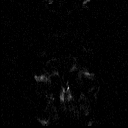

[Series 11: FLAIR · axial · 5.0mm · 0.90mm/px · z∈[-86,+57]mm · 2 of 23 slices shown]
[im 1/23]
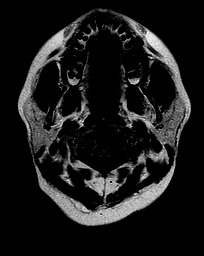
[im 23/23]
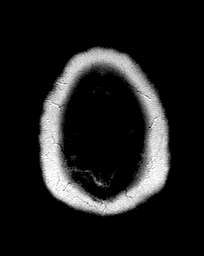

[Series 12: TOF · axial · 0.7mm · 0.74mm/px · z∈[-73,+18]mm · 12 of 131 slices shown]
[im 1/131]
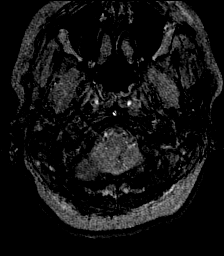
[im 12/131]
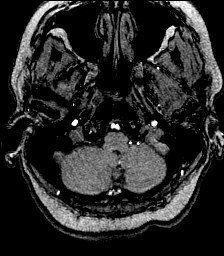
[im 24/131]
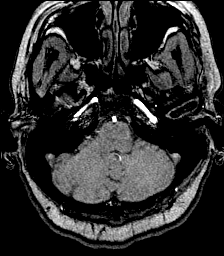
[im 36/131]
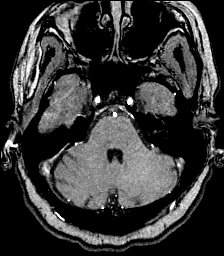
[im 48/131]
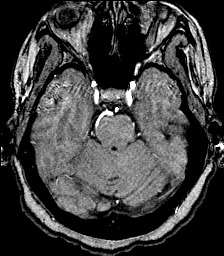
[im 60/131]
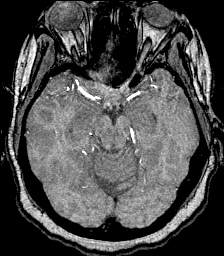
[im 71/131]
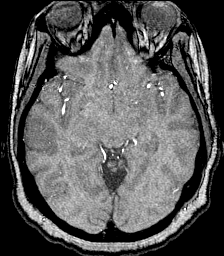
[im 83/131]
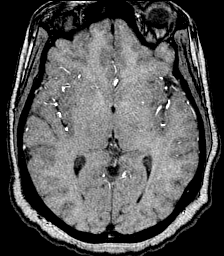
[im 95/131]
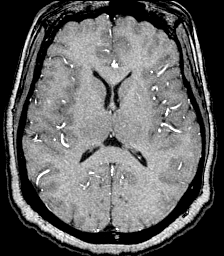
[im 107/131]
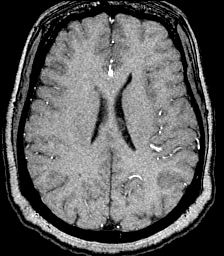
[im 119/131]
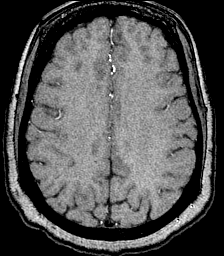
[im 131/131]
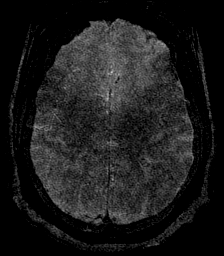

[Series 13: T2 · axial · 5.0mm · 0.45mm/px · z∈[-86,+56]mm · 2 of 23 slices shown (2 of 3)]
[im 1/23]
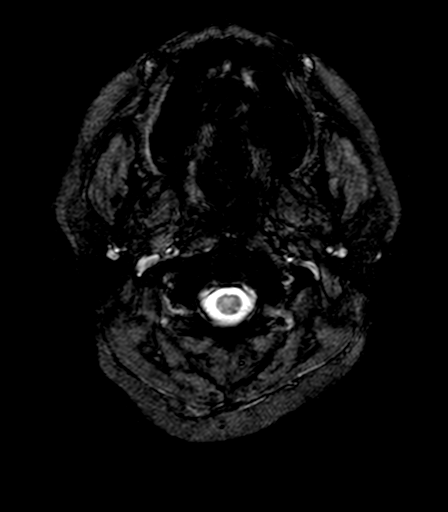
[im 23/23]
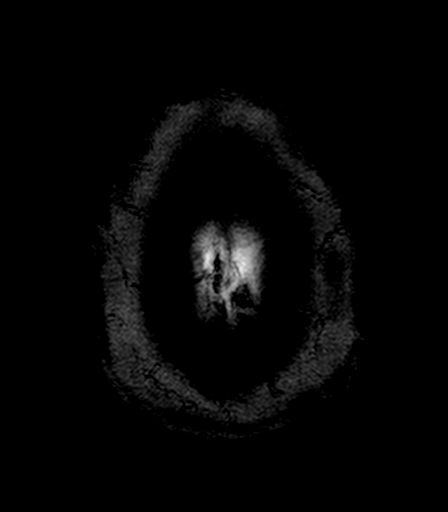

[Series 14: tof_2d_mrv · coronal · 3.0mm · 0.98mm/px · 8 of 87 slices shown]
[im 1/87]
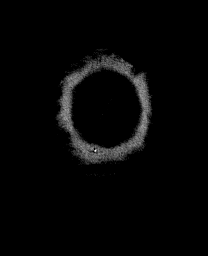
[im 13/87]
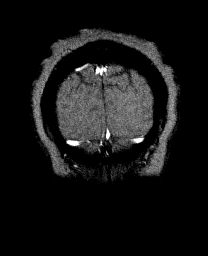
[im 25/87]
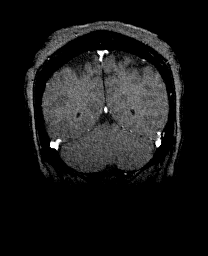
[im 37/87]
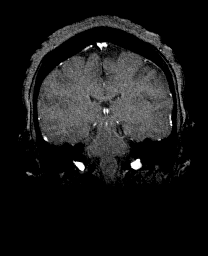
[im 50/87]
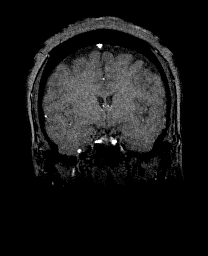
[im 62/87]
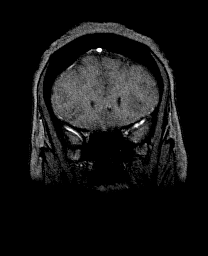
[im 74/87]
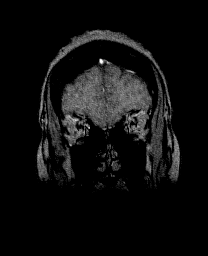
[im 87/87]
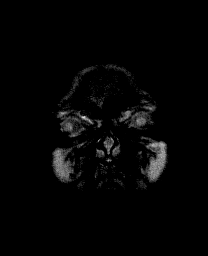

[Series 22: T1 · axial · 3.0mm · 0.45mm/px · z∈[-89,+64]mm · 5 of 52 slices shown (2 of 2)]
[im 1/52]
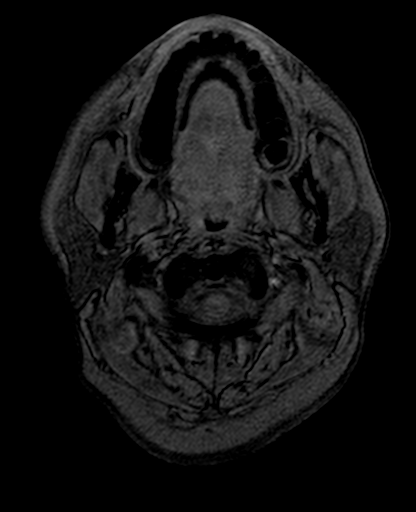
[im 13/52]
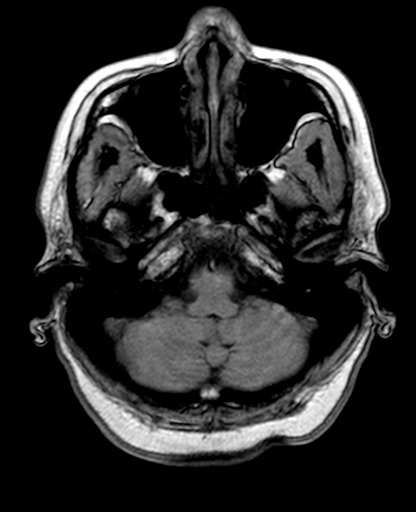
[im 26/52]
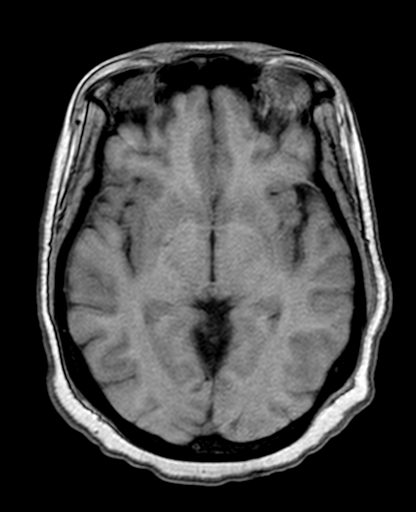
[im 39/52]
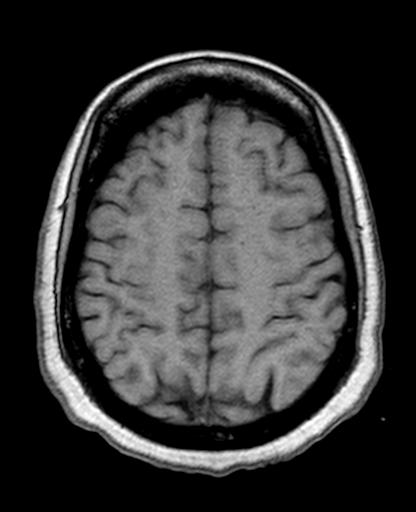
[im 52/52]
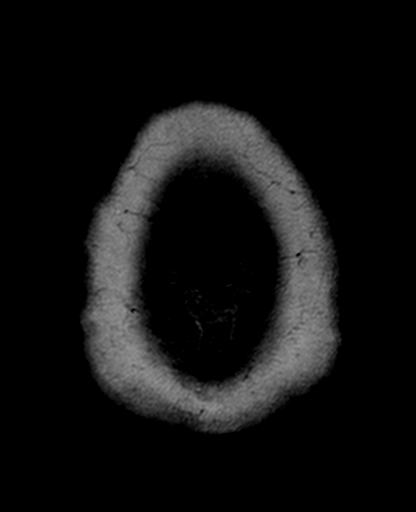

[Series 23: T2 · coronal · 5.0mm · 0.45mm/px · 2 of 27 slices shown (3 of 3)]
[im 1/27]
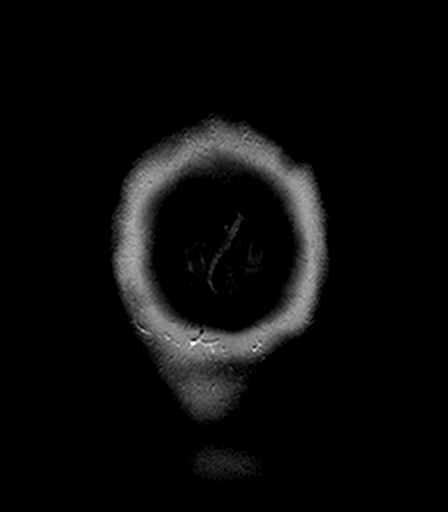
[im 27/27]
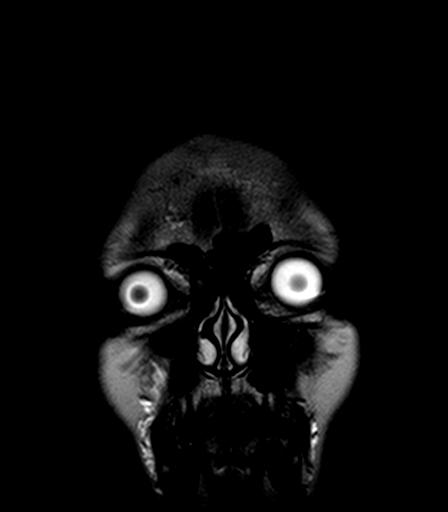

[48 of 48 positions shown; findings below may reference images not displayed]

FINDINGS: Major dural sinuses are all patent without occlusion or thrombus.
Internal cerebral veins and straight sinus are patent.
IMPRESSION: Negative

## 2017-09-18 ENCOUNTER — Encounter: Payer: BC Managed Care – PPO | Attending: Hematology | Admitting: Dietician

## 2017-09-18 DIAGNOSIS — Z713 Dietary counseling and surveillance: Secondary | ICD-10-CM | POA: Insufficient documentation

## 2017-09-18 DIAGNOSIS — Z6835 Body mass index (BMI) 35.0-35.9, adult: Secondary | ICD-10-CM | POA: Insufficient documentation

## 2017-09-18 DIAGNOSIS — E669 Obesity, unspecified: Secondary | ICD-10-CM | POA: Insufficient documentation

## 2017-09-18 NOTE — Patient Instructions (Addendum)
Consider label reading Consider weaning sugar/cream in coffee Be sure to stay hydrated.  Add slices of lemon, lime, oranges or other fruit or splash of juice to water to change the flavor.  Consider herbal tea or unsweetened tea.  Continue to work on mindfulness What could you do to increase your activity?  Find ways to increase your fruit and vegetable intake. Cooking at home (meal prep) more frequently.

## 2017-09-18 NOTE — Progress Notes (Signed)
  Medical Nutrition Therapy:  Appt start time: 9518 end time:  1600.  Assessment:  Primary concerns today: Patient is here today for follow up for weight control.  She states that things have been going very slow.  Patient has lost 2 lbs in the past month.  TANITA  BODY COMP RESULTS 09/18/17 229.4 lbs   BMI (kg/m^2) 34.9   Fat Mass (lbs) 116.6   Fat Free Mass (lbs) 112.8   Total Body Water (lbs) 82   TANITA  BODY COMP RESULTS 08/13/16 231.4 lbs   BMI (kg/m^2) 35.2   Fat Mass (lbs) 117.6   Fat Free Mass (lbs) 113.8   Total Body Water (lbs) 82.8    Weight history 125 lbs in her 20's. Gained weight with pregnancies but lost it with the first 2 but not with the 3rd.  She continued to gain since that time.  She followed Weight Watcher's in the past and lost the first time but not the second. Highest weight is 231 lbs.  Patient lives with her husband and 68 yo son.  Patient does the shopping and her husband does most of the cooking.  She states that he does not cook the healthiest but she does not want to discourage him from cooking.  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  She has to pick up her son from work and his schedule is changing again and will need wo pick him up at about 10:30 pm.  MEDICATIONS: see list  DIETARY INTAKE: Can't tolerate artificial sweeteners. 24-hr recall:  B ( AM): flat bun with [place], egg, and cheese, coffee with 2 sugar and 3 cream or an iced coffee  Snk ( AM) :none  L ( PM): frozen meal but has started to get healthier choices  Snk ( PM): light popcorn or fruit D ( PM): take out at times or fried chicken and sides that her husband cooks  Snk ( PM): none Beverages: water but sometimes will not drink as she is tired of water  Recent physical activity: has not started any plan  Estimated energy needs: 1400 calories 80-100 g protein  Progress Towards Goal(s):  In progress.   Nutritional Diagnosis:  NB-1.1 Food and nutrition-related knowledge deficit As  related to nutrition for weight loss.  As evidenced by patient report adn diet hx.    Intervention:  Nutrition counseling/education related to healthy nutrition and mindful eating continued.  Looked at barriers to behavior change such as time and schedule issues.  Encouraged her to drink enough to stay hydrated and praised for reducing her sugar beverage intake..  Consider label reading Consider weaning sugar/cream in coffee Be sure to stay hydrated.  Add slices of lemon, lime, oranges or other fruit or splash of juice to water to change the flavor.  Consider herbal tea or unsweetened tea.  Continue to work on mindfulness What could you do to increase your activity?  Find ways to increase your fruit and vegetable intake. Cooking at home (meal prep) more frequently.   Monitoring/Evaluation:  Dietary intake, exercise, label reading, and body weight in 1 month(s).

## 2017-09-19 ENCOUNTER — Telehealth: Payer: Self-pay | Admitting: *Deleted

## 2017-09-19 DIAGNOSIS — Z86711 Personal history of pulmonary embolism: Secondary | ICD-10-CM

## 2017-09-19 NOTE — Telephone Encounter (Signed)
TCT patient with PT/INR results from 2 days ago. No answer but was able to leave message on identified phone #. Pt is to continue current coumadin dosage and have labs repeated in 1-2 weeks. Scheduling request has been sent for lab.

## 2017-09-20 ENCOUNTER — Telehealth: Payer: Self-pay | Admitting: Hematology

## 2017-09-20 NOTE — Telephone Encounter (Signed)
Scheduled appt per 2/20 sch message - left message with appt date and time.  

## 2017-09-26 ENCOUNTER — Telehealth: Payer: Self-pay

## 2017-09-26 ENCOUNTER — Inpatient Hospital Stay: Payer: BC Managed Care – PPO

## 2017-09-26 DIAGNOSIS — I2699 Other pulmonary embolism without acute cor pulmonale: Secondary | ICD-10-CM

## 2017-09-26 DIAGNOSIS — Z86718 Personal history of other venous thrombosis and embolism: Secondary | ICD-10-CM | POA: Diagnosis not present

## 2017-09-26 LAB — PROTIME-INR
INR: 1.81
Prothrombin Time: 20.8 seconds — ABNORMAL HIGH (ref 11.4–15.2)

## 2017-09-26 NOTE — Telephone Encounter (Signed)
Patient going out of town and needed to get labs today. Per 2/27 los

## 2017-09-27 ENCOUNTER — Other Ambulatory Visit: Payer: BC Managed Care – PPO

## 2017-09-27 ENCOUNTER — Telehealth: Payer: Self-pay | Admitting: *Deleted

## 2017-09-27 NOTE — Telephone Encounter (Signed)
TCT patient regarding lab results from yesterday, 09/26/17.  No answer but was able to leave vm message on identified phone#  Pt is to increase her coumadin to 7.5 mg daily and we will schedule repaet lab in 2 weeks.  Scheduling message sent.

## 2017-10-15 ENCOUNTER — Inpatient Hospital Stay: Payer: BC Managed Care – PPO | Attending: Hematology

## 2017-10-15 DIAGNOSIS — I2699 Other pulmonary embolism without acute cor pulmonale: Secondary | ICD-10-CM

## 2017-10-15 DIAGNOSIS — Z86711 Personal history of pulmonary embolism: Secondary | ICD-10-CM | POA: Diagnosis not present

## 2017-10-15 DIAGNOSIS — Z86718 Personal history of other venous thrombosis and embolism: Secondary | ICD-10-CM | POA: Diagnosis present

## 2017-10-15 LAB — COMPREHENSIVE METABOLIC PANEL
ALT: 20 U/L (ref 0–55)
AST: 20 U/L (ref 5–34)
Albumin: 3.5 g/dL (ref 3.5–5.0)
Alkaline Phosphatase: 100 U/L (ref 40–150)
Anion gap: 8 (ref 3–11)
BUN: 15 mg/dL (ref 7–26)
CO2: 25 mmol/L (ref 22–29)
Calcium: 8.8 mg/dL (ref 8.4–10.4)
Chloride: 106 mmol/L (ref 98–109)
Creatinine, Ser: 0.84 mg/dL (ref 0.60–1.10)
GFR calc Af Amer: >60 mL/min (ref >60)
GFR calc non Af Amer: >60 mL/min (ref >60)
Glucose, Bld: 79 mg/dL (ref 70–140)
Potassium: 4.1 mmol/L (ref 3.5–5.1)
Sodium: 139 mmol/L (ref 136–145)
Total Bilirubin: 0.3 mg/dL (ref 0.2–1.2)
Total Protein: 7.8 g/dL (ref 6.4–8.3)

## 2017-10-15 LAB — PROTIME-INR
INR: 1.7
Prothrombin Time: 19.8 seconds — ABNORMAL HIGH (ref 11.4–15.2)

## 2017-10-16 ENCOUNTER — Telehealth: Payer: Self-pay | Admitting: Hematology

## 2017-10-16 ENCOUNTER — Telehealth: Payer: Self-pay | Admitting: Emergency Medicine

## 2017-10-16 NOTE — Telephone Encounter (Signed)
Called patient regarding lab appointment per 3/19 sch msg

## 2017-10-16 NOTE — Telephone Encounter (Signed)
Called pt regarding labs from 3/17.  No answer.  Left voicemail asking for pt to call back.

## 2017-10-18 ENCOUNTER — Telehealth: Payer: Self-pay | Admitting: *Deleted

## 2017-10-18 ENCOUNTER — Encounter: Payer: BC Managed Care – PPO | Attending: Hematology | Admitting: Dietician

## 2017-10-18 ENCOUNTER — Encounter: Payer: Self-pay | Admitting: Dietician

## 2017-10-18 DIAGNOSIS — Z6835 Body mass index (BMI) 35.0-35.9, adult: Secondary | ICD-10-CM | POA: Insufficient documentation

## 2017-10-18 DIAGNOSIS — E119 Type 2 diabetes mellitus without complications: Secondary | ICD-10-CM

## 2017-10-18 DIAGNOSIS — E669 Obesity, unspecified: Secondary | ICD-10-CM

## 2017-10-18 DIAGNOSIS — Z713 Dietary counseling and surveillance: Secondary | ICD-10-CM | POA: Insufficient documentation

## 2017-10-18 NOTE — Telephone Encounter (Signed)
Pt returned call.  Per pt, she is taking  Coumadin  7.5 mg daily.   Pt has lab scheduled for 10/29/17.  Pt voiced understanding.

## 2017-10-18 NOTE — Progress Notes (Signed)
  Medical Nutrition Therapy:  Appt start time: 3903 end time:  1600.  Assessment:  Primary concerns today: Patient is here today alone for follow up for obesity.  She states that since last visit that she was diagnosed with type 2 Diabetes and high cholesterol and has been started on Atorvastatin and Metformin.  She continues to take Coumadin for a history of blood clots. She has also started on Weight Watchers 3 weeks ago.    Weight history: 125 lbs in 20's Gained weight with pregnancies but lost it with first 2 but not with the 3rd.  She continued to gain since that time. She followed Weight Watcher's in the past and lost the first time but not the second. 230 lbs highest weight 08/13/16.  TANITA  BODY COMP RESULTS 10/18/17 226.8 lbs   BMI (kg/m^2) 34.5   Fat Mass (lbs) 112.4   Fat Free Mass (lbs) 112.4   Total Body Water (lbs)    Patient lives with her husband and 98 yo son.  Patient does the shopping and her husband cooks.   He is offended at times when she does not eat what he cooks but she is starting not always eat what he cooks due to her goals.     MEDICATIONS: see list  Diet hx:  Similar to last admit.  Drinking more water but still not enough.  Stopped drinking the coffee related to sugar and fat content.  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  Estimated energy needs: 1400 calories 80-100 g protein  Progress Towards Goal(s):  In progress.   Nutritional Diagnosis:  NB-1.1 Food and nutrition-related knowledge deficit As related to balance of carbohydrates, protein, and fat.  As evidenced by patient report.    Intervention:  Nutrition counseling/edication continued related to general nutrition, guidelines for weight loss and mindful eating.  Discussed benefits of incorporating exercise into her day as well importance of staying hydrated.  Discussed new diabetes diagnosis, insulin resistance and options for meals.  Start  walking.  Aim for 30 minutes most days. Consider pedometer app or fit bit to track steps. Continue to be mindful. Consider adding variety to your day with Weight Watcher's recipes Consider Turmeric Consider herbal tea (hot or chilled) to help increase your fluid intake.  Monitoring/Evaluation:  Dietary intake, exercise, and body weight in 1 month(s).

## 2017-10-18 NOTE — Telephone Encounter (Signed)
-----   Message from Truitt Merle, MD sent at 10/15/2017  5:12 PM EDT ----- Please let pt know her lab result, INR still below 2.0, and same as two weeks ago, not sure if she received out message about increasing her coumadin dose 2 weeks ago. If not, please let her to increase it to 7.5mg  daily. And repeat in 2 weeks again. If she did increase her dose 2 weeks ago, then stay on same dose and repeat lab in 2 weeks, thanks  Truitt Merle  10/15/2017

## 2017-10-18 NOTE — Patient Instructions (Addendum)
Start walking.  Aim for 30 minutes most days. Consider pedometer app or fit bit to track steps. Continue to be mindful. Consider adding variety to your day with Weight Watcher's recipes Consider Turmeric Consider herbal tea (hot or chilled) to help increase your fluid intake.

## 2017-10-26 ENCOUNTER — Emergency Department (HOSPITAL_COMMUNITY): Payer: BC Managed Care – PPO

## 2017-10-26 ENCOUNTER — Encounter (HOSPITAL_COMMUNITY): Payer: Self-pay | Admitting: *Deleted

## 2017-10-26 ENCOUNTER — Emergency Department (HOSPITAL_COMMUNITY)
Admission: EM | Admit: 2017-10-26 | Discharge: 2017-10-26 | Disposition: A | Discharge status: Home / Self Care | Payer: BC Managed Care – PPO | Attending: Emergency Medicine | Admitting: Emergency Medicine

## 2017-10-26 DIAGNOSIS — S42201A Unspecified fracture of upper end of right humerus, initial encounter for closed fracture: Secondary | ICD-10-CM

## 2017-10-26 DIAGNOSIS — W06XXXA Fall from bed, initial encounter: Secondary | ICD-10-CM | POA: Insufficient documentation

## 2017-10-26 DIAGNOSIS — E785 Hyperlipidemia, unspecified: Secondary | ICD-10-CM | POA: Diagnosis not present

## 2017-10-26 DIAGNOSIS — Y9389 Activity, other specified: Secondary | ICD-10-CM | POA: Insufficient documentation

## 2017-10-26 DIAGNOSIS — Z86718 Personal history of other venous thrombosis and embolism: Secondary | ICD-10-CM | POA: Diagnosis not present

## 2017-10-26 DIAGNOSIS — Z79899 Other long term (current) drug therapy: Secondary | ICD-10-CM | POA: Insufficient documentation

## 2017-10-26 DIAGNOSIS — Y92003 Bedroom of unspecified non-institutional (private) residence as the place of occurrence of the external cause: Secondary | ICD-10-CM | POA: Insufficient documentation

## 2017-10-26 DIAGNOSIS — Y998 Other external cause status: Secondary | ICD-10-CM | POA: Diagnosis not present

## 2017-10-26 DIAGNOSIS — S42254A Nondisplaced fracture of greater tuberosity of right humerus, initial encounter for closed fracture: Secondary | ICD-10-CM | POA: Diagnosis not present

## 2017-10-26 DIAGNOSIS — E119 Type 2 diabetes mellitus without complications: Secondary | ICD-10-CM | POA: Diagnosis not present

## 2017-10-26 DIAGNOSIS — Z7901 Long term (current) use of anticoagulants: Secondary | ICD-10-CM | POA: Insufficient documentation

## 2017-10-26 DIAGNOSIS — S4991XA Unspecified injury of right shoulder and upper arm, initial encounter: Secondary | ICD-10-CM | POA: Diagnosis present

## 2017-10-26 MED ORDER — HYDROCODONE-ACETAMINOPHEN 5-325 MG PO TABS
1.0000 | ORAL_TABLET | Freq: Once | ORAL | Status: AC
  Administered 2017-10-26: 1 via ORAL
  Filled 2017-10-26: qty 1

## 2017-10-26 MED ORDER — HYDROCODONE-ACETAMINOPHEN 5-325 MG PO TABS: ORAL_TABLET | ORAL | 0 refills | Status: DC

## 2017-10-26 NOTE — ED Triage Notes (Signed)
Pt complains of right shoulder pain since falling out of bed this morning. Pt has pain w/ movement.

## 2017-10-26 NOTE — Discharge Instructions (Addendum)
Take vicodin for breakthrough pain, do not drink alcohol, drive, care for children or do other critical tasks while taking vicodin. ° °Please follow with your primary care doctor in the next 2 days for a check-up. They must obtain records for further management.  ° °Do not hesitate to return to the Emergency Department for any new, worsening or concerning symptoms.  ° °

## 2017-10-26 NOTE — ED Provider Notes (Signed)
[facility] DEPT Provider Note   CSN: [id number] Arrival date & time: 10/26/17  1004     History   Chief Complaint Chief Complaint  Patient presents with  . Shoulder Pain    HPI   Blood pressure 120/62, pulse 82, temperature 97.9 F (36.6 C), temperature source Oral, resp. rate 18, SpO2 100 %.  Bianca Simmons is a 52 y.o. female complaining of severe right shoulder pain status post fall this morning.  She states she was getting up out of bed and she is not quite sure what happened but she fell onto the shoulder, should not fall onto an outstretched hand.  She takes Coumadin for PE.  Last INR was 1.7.  There is no head trauma, loss of consciousness, cervicalgia, chest pain, abdominal pain.  There was no prodrome of palpitations.  This was not a drop attack, patient believes it was mechanical.   Past Medical History:  Diagnosis Date  . Diabetes mellitus without complication ([place])   . Hyperlipidemia   . Pulmonary embolism, blood-clot, obstetric 2002   on birth control-pulm blood clot-treated with coumadin-no problems now    Patient Active Problem List   Diagnosis Date Noted  . Obesity (BMI 35.0-39.9 without comorbidity) 06/24/2017  . Personal history of venous thrombosis and embolism 04/19/2016  . Abnormal weight gain 08/29/2012  . DVT (deep venous thrombosis) ([place] 06/08/2012  . SOB (shortness of breath) 06/07/2012  . Pulmonary embolism, bilateral ([place]) 06/07/2012  . Pulmonary embolism 10 yrs ago 06/07/2012  . Abnormal uterine bleeding 07/14/2011  . Contraceptive management 04/21/2011    Past Surgical History:  Procedure Laterality Date  . IUD REMOVAL  OCT 2012  . LAPAROSCOPIC TUBAL LIGATION  07/14/2011   Procedure: LAPAROSCOPIC TUBAL LIGATION;  Surgeon: Agnes Lawrence, MD;  Location: [facility];  Service: Gynecology;  Laterality: N/A;  . NO PAST SURGERIES    . TUBAL LIGATION  04/21/2011   Procedure: ESSURE TUBAL STERILIZATION;   Surgeon: Agnes Lawrence, MD;  Location: [facility];  Service: Gynecology;  Laterality: Bilateral;   attempted essure,removal of IUD, hysteroscopy,         dilatation and currettage  . vaginal ablasion       OB History   None      Home Medications    Prior to Admission medications   Medication Sig Start Date End Date Taking? Authorizing Provider  acetaminophen (TYLENOL) 500 MG tablet Take by mouth as needed.    [provider]  atorvastatin (LIPITOR) 10 MG tablet Take 10 mg by mouth daily.    [provider]  HYDROcodone-acetaminophen (NORCO/VICODIN) 5-325 MG tablet Take 1-2 tablets by mouth every 6 hours as needed for pain. 10/26/17   Caroly Purewal, Elmyra Ricks, PA-C  metFORMIN (GLUCOPHAGE) 500 MG tablet Take 500 mg by mouth 2 (two) times daily with a meal.    [provider]  naproxen sodium (ANAPROX) 220 MG tablet Take 220 mg by mouth 2 (two) times daily as needed (for pain). Reported on 10/07/2015    [provider]  warfarin (COUMADIN) 5 MG tablet Take 1 tablet (5 mg total) as directed by mouth. 06/18/17   Truitt Merle, MD  warfarin (COUMADIN) 7.5 MG tablet Take 1 tablet (7.5 mg total) daily by mouth. 7.5 mg daily except 5mg  daily on Fridays. 06/18/17   Truitt Merle, MD  Rivaroxaban (XARELTO) 15 MG TABS tablet Take 1 tablet (15 mg total) by mouth 2 (two) times daily with a meal. 05/17/16 05/19/16  Quentin Cornwall,  Saralyn Pilar, MD    Family History Family History  Problem Relation Age of Onset  . Diabetes Mother   . Diabetes Father   . Diabetes Sister     Social History Social History   Tobacco Use  . Smoking status: Never Smoker  . Smokeless tobacco: Never Used  Substance Use Topics  . Alcohol use: No  . Drug use: No     Allergies   Sulfa antibiotics   Review of Systems Review of Systems  A complete review of systems was obtained and all systems are negative except as noted in the HPI and PMH.    Physical Exam Updated Vital Signs BP 120/62 (BP  Location: Right Arm)   Pulse 82   Temp 97.9 F (36.6 C) (Oral)   Resp 18   SpO2 100%   Physical Exam  Constitutional: She is oriented to person, place, and time. She appears well-developed and well-nourished.  HENT:  Head: Normocephalic and atraumatic.  Mouth/Throat: Oropharynx is clear and moist.  No abrasions or contusions.   No hemotympanum, battle signs or raccoon's eyes  No crepitance or tenderness to palpation along the orbital rim.  EOMI intact with no pain or diplopia  No abnormal otorrhea or rhinorrhea. Nasal septum midline.  No intraoral trauma.  Eyes: Pupils are equal, round, and reactive to light. Conjunctivae and EOM are normal.  Neck: Normal range of motion. Neck supple.  No midline C-spine  tenderness to palpation or step-offs appreciated. Patient has full range of motion without pain.  Grip/bicep/tricep strength 5/5 bilaterally. Able to differentiate between pinprick and light touch bilaterally     Cardiovascular: Normal rate, regular rhythm and intact distal pulses.  Pulmonary/Chest: Effort normal and breath sounds normal. No respiratory distress. She has no wheezes. She has no rales. She exhibits no tenderness.  No s TTP or crepitance  Abdominal: Soft. Bowel sounds are normal. She exhibits no distension and no mass. There is no tenderness. There is no rebound and no guarding.  Musculoskeletal: She exhibits tenderness. She exhibits no edema.       Arms: No deltoid numbness or weakness.  Reduced range of motion to shoulder abduction.  No bony tenderness along the elbow, snuffbox tenderness negative bilaterally, distally neurovascularly intact.  Pelvis stable, No TTP of greater trochanter bilaterally  No tenderness to percussion of Lumbar/Thoracic spinous processes. No step-offs. No paraspinal muscular TTP  Neurological: She is alert and oriented to person, place, and time.  Strength 5/5 x4 extremities   Distal sensation intact  Skin: Skin is warm.    Psychiatric: She has a normal mood and affect.  Nursing note and vitals reviewed.    ED Treatments / Results  Labs (all labs ordered are listed, but only abnormal results are displayed) Labs Reviewed - No data to display  EKG None  Radiology Dg Shoulder Right  Result Date: 10/26/2017 CLINICAL DATA:  Fall today. Weakness and difficulty raising the right arm. EXAM: RIGHT SHOULDER - 2+ VIEW COMPARISON:  None. FINDINGS: Subtle lucency at the base of the greater tuberosity, suspect nondisplaced fracture. No dislocation or shoulder separation. IMPRESSION: Probable nondisplaced fracture of the greater tuberosity proximal humerus. Electronically Signed   By: Monte Fantasia M.D.   On: 10/26/2017 10:46    Procedures Procedures (including critical care time)  Medications Ordered in ED Medications  HYDROcodone-acetaminophen (NORCO/VICODIN) 5-325 MG per tablet 1 tablet (1 tablet Oral Given 10/26/17 1243)     Initial Impression / Assessment and Plan / ED Course  I  have reviewed the triage vital signs and the nursing notes.  Pertinent labs & imaging results that were available during my care of the patient were reviewed by me and considered in my medical decision making (see chart for details).     Vitals:   10/26/17 1018 10/26/17 1244  BP: 121/60 120/62  Pulse: 81 82  Resp:  18  Temp: 97.9 F (36.6 C)   TempSrc: Oral   SpO2: 100% 100%    Medications  HYDROcodone-acetaminophen (NORCO/VICODIN) 5-325 MG per tablet 1 tablet (1 tablet Oral Given 10/26/17 1243)    Ersa Delaney is 52 y.o. female presenting with right shoulder pain status post fall, x-ray consistent with a nondisplaced fracture.  Neurovascularly intact.  Patient given sling, orthopedic referral, Vicodin for pain control work note provided.  Evaluation does not show pathology that would require ongoing emergent intervention or inpatient treatment. Pt is hemodynamically stable and mentating appropriately. Discussed  findings and plan with patient/guardian, who agrees with care plan. All questions answered. Return precautions discussed and outpatient follow up given.      Final Clinical Impressions(s) / ED Diagnoses   Final diagnoses:  Closed fracture of proximal end of right humerus, unspecified fracture morphology, initial encounter    ED Discharge Orders        Ordered    HYDROcodone-acetaminophen (NORCO/VICODIN) 5-325 MG tablet     10/26/17 1237       Kenedi Cilia, Charna Elizabeth 10/26/17 1331    Lacretia Leigh, MD 10/27/17 [phone]

## 2017-10-29 ENCOUNTER — Other Ambulatory Visit: Payer: BC Managed Care – PPO

## 2017-10-30 ENCOUNTER — Other Ambulatory Visit: Payer: BC Managed Care – PPO

## 2017-11-02 ENCOUNTER — Ambulatory Visit (INDEPENDENT_AMBULATORY_CARE_PROVIDER_SITE_OTHER): Payer: BC Managed Care – PPO | Admitting: Orthopedic Surgery

## 2017-11-02 ENCOUNTER — Encounter (INDEPENDENT_AMBULATORY_CARE_PROVIDER_SITE_OTHER): Payer: Self-pay | Admitting: Orthopedic Surgery

## 2017-11-02 ENCOUNTER — Ambulatory Visit (INDEPENDENT_AMBULATORY_CARE_PROVIDER_SITE_OTHER): Payer: BC Managed Care – PPO

## 2017-11-02 DIAGNOSIS — M25511 Pain in right shoulder: Secondary | ICD-10-CM | POA: Diagnosis not present

## 2017-11-02 DIAGNOSIS — S42254A Nondisplaced fracture of greater tuberosity of right humerus, initial encounter for closed fracture: Secondary | ICD-10-CM

## 2017-11-02 NOTE — Progress Notes (Signed)
Office Visit Note   Patient: Bianca Simmons           Date of Birth: 1965-09-28           MRN: [id number] Visit Date: 11/02/2017 Requested by: Truitt Merle, MD [place], [place] [address] PCP: Truitt Merle, MD  Subjective: Chief Complaint  Patient presents with  . Right Shoulder - Injury, Pain    HPI: Bianca Simmons is a patient with right arm injury.  She sustained a proximal humerus fracture last Friday.  Radiographs in the emergency room show nondisplaced greater tuberosity fracture.  She did not dislocate her arm.  She works as a Pharmacist, hospital for ninth graders.  She has been in a sling.  She has been taking Vicodin.              ROS: All systems reviewed are negative as they relate to the chief complaint within the history of present illness.  Patient denies  fevers or chills.   Assessment & Plan: Visit Diagnoses:  1. Acute pain of right shoulder   2. Nondisplaced fracture of greater tuberosity of right humerus, initial encounter for closed fracture     Plan: Impression is right nondisplaced greater tuberosity fracture which has not change in alignment in a week's time.  Plan is to continue sling immobilization.  Come out 3 times a day to make the elbow straight.  No lifting with the right arm.  Follow-up in 2 weeks with repeat external rotation AP view only.  If the fracture remains nondisplaced we will allow her to start doing some pendulum exercises.  Follow-Up Instructions: Return in about 2 weeks (around 11/16/2017).   Orders:  Orders Placed This Encounter  Procedures  . XR Shoulder Right   No orders of the defined types were placed in this encounter.     Procedures: No procedures performed   Clinical Data: No additional findings.  Objective: Vital Signs: There were no vitals taken for this visit.  Physical Exam:   Constitutional: Patient appears well-developed HEENT:  Head: Normocephalic Eyes:EOM are normal Neck: Normal range of  motion Cardiovascular: Normal rate Pulmonary/chest: Effort normal Neurologic: Patient is alert Skin: Skin is warm Psychiatric: Patient has normal mood and affect    Ortho Exam: Orthopedic exam demonstrates tenderness to palpation around the proximal humeral region.  Motor sensory function to the right hand is intact radial pulses intact.  Elbow range of motion nontender.  Shoulder range of motion predictably tender.  Specialty Comments:  No specialty comments available.  Imaging: Xr Shoulder Right  Result Date: 11/02/2017 Neutral AP and external rotation views of the right proximal humerus reviewed.  Nondisplaced greater tuberosity fracture again noted without significant change from radiographs 1 week ago.    PMFS History: Patient Active Problem List   Diagnosis Date Noted  . Obesity (BMI 35.0-39.9 without comorbidity) 06/24/2017  . Personal history of venous thrombosis and embolism 04/19/2016  . Abnormal weight gain 08/29/2012  . DVT (deep venous thrombosis) ([place]) 06/08/2012  . SOB (shortness of breath) 06/07/2012  . Pulmonary embolism, bilateral ([place]) 06/07/2012  . Pulmonary embolism 10 yrs ago 06/07/2012  . Abnormal uterine bleeding 07/14/2011  . Contraceptive management 04/21/2011   Past Medical History:  Diagnosis Date  . Diabetes mellitus without complication ([place])   . Hyperlipidemia   . Pulmonary embolism, blood-clot, obstetric 2002   on birth control-pulm blood clot-treated with coumadin-no problems now    Family History  Problem Relation Age of Onset  . Diabetes  Mother   . Diabetes Father   . Diabetes Sister     Past Surgical History:  Procedure Laterality Date  . IUD REMOVAL  OCT 2012  . LAPAROSCOPIC TUBAL LIGATION  07/14/2011   Procedure: LAPAROSCOPIC TUBAL LIGATION;  Surgeon: Agnes Lawrence, MD;  Location: [facility];  Service: Gynecology;  Laterality: N/A;  . NO PAST SURGERIES    . TUBAL LIGATION  04/21/2011   Procedure: ESSURE TUBAL STERILIZATION;   Surgeon: Agnes Lawrence, MD;  Location: [facility];  Service: Gynecology;  Laterality: Bilateral;   attempted essure,removal of IUD, hysteroscopy,         dilatation and currettage  . vaginal ablasion     Social History   Occupational History  . Not on file  Tobacco Use  . Smoking status: Never Smoker  . Smokeless tobacco: Never Used  Substance and Sexual Activity  . Alcohol use: No  . Drug use: No  . Sexual activity: Not on file

## 2017-11-15 ENCOUNTER — Inpatient Hospital Stay: Payer: BC Managed Care – PPO | Attending: Hematology

## 2017-11-15 DIAGNOSIS — Z86711 Personal history of pulmonary embolism: Secondary | ICD-10-CM | POA: Diagnosis not present

## 2017-11-15 DIAGNOSIS — Z86718 Personal history of other venous thrombosis and embolism: Secondary | ICD-10-CM | POA: Diagnosis present

## 2017-11-15 DIAGNOSIS — I2699 Other pulmonary embolism without acute cor pulmonale: Secondary | ICD-10-CM

## 2017-11-15 LAB — PROTIME-INR
INR: 2.47
Prothrombin Time: 26.5 seconds — ABNORMAL HIGH (ref 11.4–15.2)

## 2017-11-19 ENCOUNTER — Encounter: Payer: BC Managed Care – PPO | Attending: Hematology | Admitting: Dietician

## 2017-11-19 ENCOUNTER — Ambulatory Visit (INDEPENDENT_AMBULATORY_CARE_PROVIDER_SITE_OTHER): Payer: BC Managed Care – PPO | Admitting: Orthopedic Surgery

## 2017-11-19 DIAGNOSIS — Z713 Dietary counseling and surveillance: Secondary | ICD-10-CM | POA: Insufficient documentation

## 2017-11-19 DIAGNOSIS — Z6835 Body mass index (BMI) 35.0-35.9, adult: Secondary | ICD-10-CM | POA: Insufficient documentation

## 2017-11-19 DIAGNOSIS — E669 Obesity, unspecified: Secondary | ICD-10-CM | POA: Diagnosis present

## 2017-11-19 NOTE — Progress Notes (Signed)
  Medical Nutrition Therapy:  Appt start time: 4097 end time:  1700.  Patient arrived late.  Assessment:  Primary concerns today: Patient is here today alone for follow up for obesity.  She states that since last visit 2 months ago that she was diagnosed with type 2 Diabetes and high cholesterol and has been started on Atorvastatin and Metformin.  She continues to take Coumadin for a history of blood clots. She has also started on Weight Watchers 3 weeks ago.  She broke her shoulder in a fall and 3 weeks ago and has not started to exercise.  She states that she can walk.  She will start PT for her shoulder soon.   She has lost 2 1/2 more pounds since last visit but concerns that this is muscle mass.  She states that she is eating healthier and thinks that she is eating less.  She varies keeping track of the Weight Watcher points.    Weight history: 125 lbs in 20's Gained weight with pregnancies but lost it with first 2 but not with the 3rd.  She continued to gain since that time. She followed Weight Watcher's in the past and lost the first time but not the second. 230 lbs highest weight 08/13/16.  TANITA  BODY COMP RESULTS 11/20/17 224.2 lbs   BMI (kg/m^2) 34.1   Fat Mass (lbs) 114.2   Fat Free Mass (lbs) 110   Total Body Water (lbs) 79.8   Patient lives with her husband and 12 yo son.  Patient does the shopping and her husband cooks.   He is offended at times when she does not eat what he cooks but she is starting not always eat what he cooks due to her goals.     MEDICATIONS: see list  Diet hx:   Drinking more water but still not enough.  Stopped drinking the coffee related to sugar and fat content.  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  Estimated energy needs: 1400 calories 80-100 g protein  Progress Towards Goal(s):  In progress.   Nutritional Diagnosis:  NB-1.1 Food and nutrition-related knowledge deficit As related to balance of carbohydrates, protein, and fat.  As evidenced by patient report.    Intervention:  Nutrition counseling/edication continued related to general nutrition, guidelines for weight loss and mindful eating.  Discussed benefits of incorporating exercise into her day as well importance of staying hydrated. Discussed meal options to increase vegetables and lean protein in the diet.  Start walking.  Aim for 30 minutes most days. Consider pedometer app or fit bit to track steps. Continue to be mindful. Consider adding variety to your day with Weight Watcher's recipes Small amount of protein and carbohydrate with breakfast Consider options for variety at lunch. Consider herbal tea (hot or chilled) to help increase your fluid intake. Consider ways to increase your water.  Monitoring/Evaluation:  Dietary intake, exercise, and body weight in 1 month(s).

## 2017-11-19 NOTE — Patient Instructions (Signed)
Start walking.  Aim for 30 minutes most days. Consider pedometer app or fit bit to track steps. Continue to be mindful. Consider adding variety to your day with Weight Watcher's recipes Small amount of protein and carbohydrate with breakfast Consider options for variety at lunch. Consider herbal tea (hot or chilled) to help increase your fluid intake. Consider ways to increase your water.

## 2017-11-21 ENCOUNTER — Ambulatory Visit (INDEPENDENT_AMBULATORY_CARE_PROVIDER_SITE_OTHER): Payer: BC Managed Care – PPO

## 2017-11-21 ENCOUNTER — Encounter (INDEPENDENT_AMBULATORY_CARE_PROVIDER_SITE_OTHER): Payer: Self-pay | Admitting: Orthopedic Surgery

## 2017-11-21 ENCOUNTER — Other Ambulatory Visit: Payer: Self-pay

## 2017-11-21 ENCOUNTER — Ambulatory Visit (INDEPENDENT_AMBULATORY_CARE_PROVIDER_SITE_OTHER): Payer: BC Managed Care – PPO | Admitting: Orthopedic Surgery

## 2017-11-21 ENCOUNTER — Telehealth: Payer: Self-pay

## 2017-11-21 DIAGNOSIS — S42294D Other nondisplaced fracture of upper end of right humerus, subsequent encounter for fracture with routine healing: Secondary | ICD-10-CM

## 2017-11-21 DIAGNOSIS — I2782 Chronic pulmonary embolism: Secondary | ICD-10-CM

## 2017-11-21 MED ORDER — WARFARIN SODIUM 7.5 MG PO TABS: 7.5000 mg | ORAL_TABLET | Freq: Every day | ORAL | 3 refills | Status: DC

## 2017-11-21 NOTE — Telephone Encounter (Signed)
Spoke with patient regarding lab results. Instructed to continue taking current dose of Coumadin 7.5 mg. Patient verbalized an understanding however states she needs a refill.

## 2017-11-21 NOTE — Telephone Encounter (Signed)
-----   Message from Truitt Merle, MD sent at 11/19/2017  8:11 AM EDT ----- Please let pt know that her PT/INR was normal last week, continue current cose coumadin, thanks  Truitt Merle  11/19/2017

## 2017-11-24 ENCOUNTER — Encounter (INDEPENDENT_AMBULATORY_CARE_PROVIDER_SITE_OTHER): Payer: Self-pay | Admitting: Orthopedic Surgery

## 2017-11-24 NOTE — Progress Notes (Signed)
   Post-Op Visit Note   Patient: Bianca Simmons           Date of Birth: 19-Oct-1965           MRN: [id number] Visit Date: 11/21/2017 PCP: Truitt Merle, MD   Assessment & Plan:  Chief Complaint:  Chief Complaint  Patient presents with  . Right Shoulder - Follow-up   Visit Diagnoses:  1. Other closed nondisplaced fracture of proximal end of right humerus with routine healing, subsequent encounter     Plan: Aundrea is a patient who is now 3 weeks out right shoulder greater tuberosity fracture.  Is been in a sling.  Date of injury 10/26/2017.  On examination she has good passive range of motion with no coarse grinding or crepitus.  She is a Pharmacist, hospital.  Plan at this time is to discontinue use of the sling.  Radiographs today show no change in fracture alignment.  However I do not want her really doing any lifting with that right arm at all.  4-week return for final recheck radiographs.  Follow-Up Instructions: Return in about 1 month (around 12/19/2017).   Orders:  Orders Placed This Encounter  Procedures  . XR Shoulder Right   No orders of the defined types were placed in this encounter.   Imaging: No results found.  PMFS History: Patient Active Problem List   Diagnosis Date Noted  . Obesity (BMI 35.0-39.9 without comorbidity) 06/24/2017  . Personal history of venous thrombosis and embolism 04/19/2016  . Abnormal weight gain 08/29/2012  . DVT (deep venous thrombosis) ([place]) 06/08/2012  . SOB (shortness of breath) 06/07/2012  . Pulmonary embolism, bilateral ([place]) 06/07/2012  . Pulmonary embolism 10 yrs ago 06/07/2012  . Abnormal uterine bleeding 07/14/2011  . Contraceptive management 04/21/2011   Past Medical History:  Diagnosis Date  . Diabetes mellitus without complication ([place])   . Hyperlipidemia   . Pulmonary embolism, blood-clot, obstetric 2002   on birth control-pulm blood clot-treated with coumadin-no problems now    Family History  Problem Relation Age of Onset  .  Diabetes Mother   . Diabetes Father   . Diabetes Sister     Past Surgical History:  Procedure Laterality Date  . IUD REMOVAL  OCT 2012  . LAPAROSCOPIC TUBAL LIGATION  07/14/2011   Procedure: LAPAROSCOPIC TUBAL LIGATION;  Surgeon: Agnes Lawrence, MD;  Location: [facility];  Service: Gynecology;  Laterality: N/A;  . NO PAST SURGERIES    . TUBAL LIGATION  04/21/2011   Procedure: ESSURE TUBAL STERILIZATION;  Surgeon: Agnes Lawrence, MD;  Location: [facility];  Service: Gynecology;  Laterality: Bilateral;   attempted essure,removal of IUD, hysteroscopy,         dilatation and currettage  . vaginal ablasion     Social History   Occupational History  . Not on file  Tobacco Use  . Smoking status: Never Smoker  . Smokeless tobacco: Never Used  Substance and Sexual Activity  . Alcohol use: No  . Drug use: No  . Sexual activity: Not on file

## 2017-12-14 ENCOUNTER — Inpatient Hospital Stay: Payer: BC Managed Care – PPO | Attending: Hematology

## 2017-12-26 ENCOUNTER — Encounter (INDEPENDENT_AMBULATORY_CARE_PROVIDER_SITE_OTHER): Payer: Self-pay | Admitting: Orthopedic Surgery

## 2017-12-26 ENCOUNTER — Ambulatory Visit (INDEPENDENT_AMBULATORY_CARE_PROVIDER_SITE_OTHER): Payer: BC Managed Care – PPO | Admitting: Orthopedic Surgery

## 2017-12-26 ENCOUNTER — Ambulatory Visit (INDEPENDENT_AMBULATORY_CARE_PROVIDER_SITE_OTHER): Payer: BC Managed Care – PPO

## 2017-12-26 DIAGNOSIS — S42294D Other nondisplaced fracture of upper end of right humerus, subsequent encounter for fracture with routine healing: Secondary | ICD-10-CM

## 2017-12-30 NOTE — Progress Notes (Signed)
   Post-Op Visit Note   Patient: Bianca Simmons           Date of Birth: [birth year]           MRN: [id number] Visit Date: 12/26/2017 PCP: Truitt Merle, MD   Assessment & Plan:  Chief Complaint:  Chief Complaint  Patient presents with  . Right Shoulder - Follow-up, Fracture   Visit Diagnoses:  1. Other closed nondisplaced fracture of proximal end of right humerus with routine healing, subsequent encounter     Plan: Is a patient with right proximal humerus fracture.  Date of injury 10/26/2017.  Still having some anterior pain.  She is on blood thinners.  Hard for her to sleep on the right-hand side.  Functionally she is okay during the day.  She cannot lift anything too heavy.  Radiographs show no change in displacement of the greater tuberosity fracture.  Plan at this time is to continue current treatment and return in 4 weeks.  Could consider subacromial injection at that time if she is not improving  Follow-Up Instructions: Return in about 1 month (around 01/23/2018).   Orders:  Orders Placed This Encounter  Procedures  . XR Shoulder Right   No orders of the defined types were placed in this encounter.   Imaging: No results found.  PMFS History: Patient Active Problem List   Diagnosis Date Noted  . Obesity (BMI 35.0-39.9 without comorbidity) 06/24/2017  . Personal history of venous thrombosis and embolism 04/19/2016  . Abnormal weight gain 08/29/2012  . DVT (deep venous thrombosis) ([place]) 06/08/2012  . SOB (shortness of breath) 06/07/2012  . Pulmonary embolism, bilateral ([place]) 06/07/2012  . Pulmonary embolism 10 yrs ago 06/07/2012  . Abnormal uterine bleeding 07/14/2011  . Contraceptive management 04/21/2011   Past Medical History:  Diagnosis Date  . Diabetes mellitus without complication ([place])   . Hyperlipidemia   . Pulmonary embolism, blood-clot, obstetric 2002   on birth control-pulm blood clot-treated with coumadin-no problems now    Family History  Problem  Relation Age of Onset  . Diabetes Mother   . Diabetes Father   . Diabetes Sister     Past Surgical History:  Procedure Laterality Date  . IUD REMOVAL  OCT 2012  . LAPAROSCOPIC TUBAL LIGATION  07/14/2011   Procedure: LAPAROSCOPIC TUBAL LIGATION;  Surgeon: Agnes Lawrence, MD;  Location: [facility];  Service: Gynecology;  Laterality: N/A;  . NO PAST SURGERIES    . TUBAL LIGATION  04/21/2011   Procedure: ESSURE TUBAL STERILIZATION;  Surgeon: Agnes Lawrence, MD;  Location: [facility];  Service: Gynecology;  Laterality: Bilateral;   attempted essure,removal of IUD, hysteroscopy,         dilatation and currettage  . vaginal ablasion     Social History   Occupational History  . Not on file  Tobacco Use  . Smoking status: Never Smoker  . Smokeless tobacco: Never Used  Substance and Sexual Activity  . Alcohol use: No  . Drug use: No  . Sexual activity: Not on file

## 2018-01-14 ENCOUNTER — Inpatient Hospital Stay: Payer: BC Managed Care – PPO

## 2018-01-18 ENCOUNTER — Inpatient Hospital Stay: Payer: BC Managed Care – PPO | Attending: Hematology

## 2018-01-18 DIAGNOSIS — I2699 Other pulmonary embolism without acute cor pulmonale: Secondary | ICD-10-CM

## 2018-01-18 LAB — PROTIME-INR
INR: 2.43
Prothrombin Time: 26.2 seconds — ABNORMAL HIGH (ref 11.4–15.2)

## 2018-01-21 ENCOUNTER — Ambulatory Visit: Payer: BC Managed Care – PPO | Admitting: Dietician

## 2018-01-21 ENCOUNTER — Telehealth: Payer: Self-pay

## 2018-01-21 NOTE — Telephone Encounter (Signed)
Per Dr. Burr Medico notified patient of her PT/INR results, continue on current dose of coumadin, patient verbalized an understanding.

## 2018-01-21 NOTE — Telephone Encounter (Signed)
-----   Message from Truitt Merle, MD sent at 01/19/2018  4:53 PM EDT ----- Please let pt know her PT/INR result, continue current dose coumadin, thanks  Truitt Merle  01/19/2018

## 2018-02-06 ENCOUNTER — Telehealth: Payer: Self-pay | Admitting: Hematology

## 2018-02-06 NOTE — Telephone Encounter (Signed)
Call regarding date change

## 2018-02-13 ENCOUNTER — Inpatient Hospital Stay: Payer: BC Managed Care – PPO | Attending: Hematology

## 2018-03-14 ENCOUNTER — Ambulatory Visit: Payer: Self-pay | Admitting: Hematology

## 2018-03-14 ENCOUNTER — Other Ambulatory Visit: Payer: Self-pay

## 2018-03-18 NOTE — Progress Notes (Signed)
[name]  [phone] [phone]  Clinic Follow up Note   Patient Care Team: Dion Body, MD as PCP - General (Family Medicine) 03/21/2018  DIAGNOSIS:  1. Pulmonary embolism, acute bilateral with high clot burden in association with an asymptomatic DVT of the right lower leg  diagnosed on 06/07/2012. Bianca Simmons was treated with Xarelto (rivaroxaban). Doppler study of the right leg carried out on 08/20/2012 and CT angiogram of the chest carried out on 08/23/2012 showed resolution of previous blood clots. Hypercoagulable workup carried out on 06/08/2012 was negative  2. DVT involving the right lower extremity, asymptomatic at presentation with clots seen in the right distal popliteal, peroneal, and posterior tibial veins on Doppler study from 06/08/2012.  3. History of pulmonary embolism in association with birth control pills in 2001.  4. Unprovoked bilateral PE on 05/18/2016  CURRENT THERAPY: coumadin  7.5mg  daily  INTERVAL HISTORY:  [name] returns for follow up. Bianca Simmons is here alone. Bianca Simmons is doing well. Bianca Simmons doesn't have menses and is doing well. Bianca Simmons fell in march and fractured her humerus. Bianca Simmons is recovering well. Bianca Simmons follow-up with her family physician.      REVIEW OF SYSTEMS:  Constitutional: Denies fevers, chills or abnormal weight loss Eyes: Denies blurriness of vision Ears, nose, mouth, throat, and face: Denies mucositis or sore throat Respiratory: No cough, CP or nasal discharge  Cardiovascular: Denies palpitation, chest discomfort or lower extremity swelling Gastrointestinal:  Denies nausea, heartburn or change in bowel habits Skin: Denies abnormal skin rashes Lymphatics: Denies new lymphadenopathy or easy bruising Neurological:Denies numbness, tingling or new weaknesses Behavioral/Psych: Mood is stable, no new changes  All other systems were reviewed with the patient and are negative.  MEDICAL HISTORY:  Past Medical History:    Diagnosis Date  . Diabetes mellitus without complication ([place])   . Hyperlipidemia   . Pulmonary embolism, blood-clot, obstetric 2002   on birth control-pulm blood clot-treated with coumadin-no problems now    SURGICAL HISTORY: Past Surgical History:  Procedure Laterality Date  . IUD REMOVAL  OCT 2012  . LAPAROSCOPIC TUBAL LIGATION  07/14/2011   Procedure: LAPAROSCOPIC TUBAL LIGATION;  Surgeon: Agnes Lawrence, MD;  Location: [facility];  Service: Gynecology;  Laterality: N/A;  . NO PAST SURGERIES    . TUBAL LIGATION  04/21/2011   Procedure: ESSURE TUBAL STERILIZATION;  Surgeon: Agnes Lawrence, MD;  Location: [facility];  Service: Gynecology;  Laterality: Bilateral;   attempted essure,removal of IUD, hysteroscopy,         dilatation and currettage  . vaginal ablasion      I have reviewed the social history and family history with the patient and they are unchanged from previous note.  ALLERGIES:  is allergic to sulfa antibiotics.  MEDICATIONS:  Current Outpatient Medications  Medication Sig Dispense Refill  . atorvastatin (LIPITOR) 10 MG tablet Take 10 mg by mouth daily.    . metFORMIN (GLUCOPHAGE) 500 MG tablet Take 500 mg by mouth 2 (two) times daily with a meal.    . warfarin (COUMADIN) 7.5 MG tablet Take 1 tablet (7.5 mg total) by mouth daily. 7.5 mg daily except 5mg  daily on Fridays. 90 tablet 3  . acetaminophen (TYLENOL) 500 MG tablet Take by mouth as needed.    Marland Kitchen HYDROcodone-acetaminophen (NORCO/VICODIN) 5-325 MG tablet Take 1-2 tablets by mouth every 6 hours as needed for pain. (Patient not taking: Reported on 03/21/2018) 17 tablet 0  . naproxen sodium (ANAPROX) 220 MG tablet Take 220 mg by  mouth 2 (two) times daily as needed (for pain). Reported on 10/07/2015     No current facility-administered medications for this visit.     PHYSICAL EXAMINATION:  ECOG PERFORMANCE STATUS: 1  Vitals:   03/21/18 1002  BP: 126/61  Pulse: 67  Resp: 17  Temp: 98.3 F (36.8 C)   SpO2: 100%   Filed Weights   03/21/18 1002  Weight: 219 lb 6.4 oz (99.5 kg)     GENERAL:alert, no distress and comfortable SKIN: skin color, texture, turgor are normal, no rashes or significant lesions EYES: normal, Conjunctiva are pink and non-injected, sclera clear OROPHARYNX:no exudate, no erythema and lips, buccal mucosa, and tongue normal  NECK: supple, thyroid normal size, non-tender, without nodularity LYMPH:  no palpable lymphadenopathy in the cervical, axillary or inguinal LUNGS: clear to auscultation and percussion with normal breathing effort HEART: regular rate & rhythm and no murmurs and no lower extremity edema ABDOMEN:abdomen soft, non-tender and normal bowel sounds Musculoskeletal:no cyanosis of digits and no clubbing  NEURO: alert & oriented x 3 with fluent speech, no focal motor/sensory deficits (+) dizziness, headaches.   LABORATORY DATA:  I have reviewed the data as listed CBC Latest Ref Rng & Units 03/21/2018 09/17/2017 03/30/2017  WBC 3.9 - 10.3 K/uL 4.8 6.8 8.4  Hemoglobin 11.6 - 15.9 g/dL 12.4 12.6 11.7  Hematocrit 34.8 - 46.6 % 37.6 38.6 36.0  Platelets 145 - 400 K/uL 203 216 259     CMP Latest Ref Rng & Units 10/15/2017 04/19/2017 10/24/2016  Glucose 70 - 140 mg/dL 79 98 72  BUN 7 - 26 mg/dL 15 12.9 10.5  Creatinine 0.60 - 1.10 mg/dL 0.84 0.9 0.7  Sodium 136 - 145 mmol/L 139 141 139  Potassium 3.5 - 5.1 mmol/L 4.1 3.7 3.9  Chloride 98 - 109 mmol/L 106 - -  CO2 22 - 29 mmol/L 25 26 24   Calcium 8.4 - 10.4 mg/dL 8.8 9.4 9.2  Total Protein 6.4 - 8.3 g/dL 7.8 7.6 7.4  Total Bilirubin 0.2 - 1.2 mg/dL 0.3 0.22 0.29  Alkaline Phos 40 - 150 U/L 100 114 117  AST 5 - 34 U/L 20 22 15   ALT 0 - 55 U/L 20 23 23    Results for [name], [name] (MRN [id number]) as of 03/18/2018 18:54  Ref. Range 09/17/2017 15:35 09/26/2017 15:38 10/15/2017 15:51 11/15/2017 15:40 01/18/2018 14:51  Prothrombin Time Latest Ref Range: 11.4 - 15.2 seconds 18.5 (H) 20.8 (H) 19.8 (H) 26.5 (H) 26.2  (H)  INR Unknown 1.55 1.81 1.70 2.47 2.43     RADIOGRAPHIC STUDIES: I have personally reviewed the radiological images as listed and agreed with the findings in the report.  MRI MRA Head 07/14/2016 IMPRESSION: Negative MRI head  Negative MRA head  CT Head 07/14/2016 IMPRESSION: No acute intracranial pathology.  ASSESSMENT & PLAN:  52 y.o. female, with history of one episode of PE when Bianca Simmons was on birth control pill, and second episode of unprovoked PE and DVT. Bianca Simmons lost to follow-up subsequently. Bianca Simmons presented with unprovoked PE again when Bianca Simmons was on Xarelto in 04/2016  1. Recurrent PE and DVT -Bianca Simmons had a negative hypercoagulation workup -Giving the recurrent pulmonary and reason, especially second episode was unprovoked, young age, and low risk of bleeding, I recommend her to restart prophylactic anticoagulation indefinitely. Bianca Simmons agrees. -Bianca Simmons developed unprovoked bilateral PE when Bianca Simmons was on Xarelto 20 mg daily in 04/2016.  -Her dyspnea on exertion and hypoxia has previously resolved -Bianca Simmons was previously on Coumadin  7.5 mg  daily but INR super-therapeutic, 4.40 on 03/08/17, goal is 2.0-3.0. Bianca Simmons will change to 7.5mg  daily except 5mg  on Mondays and Fridays  -due to her subtherapeutic INR, Coumadin was changed back to 7.5 mg daily subsequently  -Bianca Simmons preferred to follow-up with [place] for her anticoagulation management. -Labs reviewed, CBC was WNLs and CMP pending -Today's PT is 23.7 and INR is 2.14, within limit to continue coumadin at 7.5mg  daily  -I discussed if Bianca Simmons needs an invasive procedure Bianca Simmons should contact us to hold her coumadin.  -F/u in 6 months    2. Obesity, hyperglycemia  -Bianca Simmons recently started on Janumet by her PCP  -I encouraged her eat healthy, and exercise regularly.  3. Intermittent dizziness -Bianca Simmons'll follow-up with her primary care physician  Plan -continue Coumadin 7.5mg  daily  -Lab every 2 months X3 -F/u in 6 month   All questions were answered. The patient  knows to call the clinic with any problems, questions or concerns. No barriers to learning was detected.  I spent 15 minutes counseling the patient face to face. The total time spent in the appointment was 20 minutes and more than 50% was on counseling and review of test results  I, Noor Dweik am acting as scribe for Dr. Truitt Merle.  I have reviewed the above documentation for accuracy and completeness, and I agree with the above.     Truitt Merle 03/21/2018

## 2018-03-21 ENCOUNTER — Telehealth: Payer: Self-pay | Admitting: Hematology

## 2018-03-21 ENCOUNTER — Inpatient Hospital Stay: Payer: BC Managed Care – PPO | Attending: Hematology

## 2018-03-21 ENCOUNTER — Inpatient Hospital Stay (HOSPITAL_BASED_OUTPATIENT_CLINIC_OR_DEPARTMENT_OTHER): Payer: BC Managed Care – PPO | Admitting: Hematology

## 2018-03-21 VITALS — BP 126/61 | HR 67 | Temp 98.3°F | Resp 17 | Ht 68.0 in | Wt 219.4 lb

## 2018-03-21 DIAGNOSIS — Z86718 Personal history of other venous thrombosis and embolism: Secondary | ICD-10-CM | POA: Diagnosis not present

## 2018-03-21 DIAGNOSIS — Z7901 Long term (current) use of anticoagulants: Secondary | ICD-10-CM

## 2018-03-21 DIAGNOSIS — I2782 Chronic pulmonary embolism: Secondary | ICD-10-CM | POA: Diagnosis present

## 2018-03-21 DIAGNOSIS — E119 Type 2 diabetes mellitus without complications: Secondary | ICD-10-CM | POA: Insufficient documentation

## 2018-03-21 DIAGNOSIS — Z79899 Other long term (current) drug therapy: Secondary | ICD-10-CM | POA: Diagnosis not present

## 2018-03-21 DIAGNOSIS — R42 Dizziness and giddiness: Secondary | ICD-10-CM | POA: Insufficient documentation

## 2018-03-21 DIAGNOSIS — E669 Obesity, unspecified: Secondary | ICD-10-CM | POA: Insufficient documentation

## 2018-03-21 DIAGNOSIS — I2699 Other pulmonary embolism without acute cor pulmonale: Secondary | ICD-10-CM

## 2018-03-21 LAB — CBC WITH DIFFERENTIAL/PLATELET
Basophils Absolute: 0 10*3/uL (ref 0.0–0.1)
Basophils Relative: 1 %
Eosinophils Absolute: 0.1 10*3/uL (ref 0.0–0.5)
Eosinophils Relative: 3 %
HCT: 37.6 % (ref 34.8–46.6)
Hemoglobin: 12.4 g/dL (ref 11.6–15.9)
Lymphocytes Relative: 28 %
Lymphs Abs: 1.3 10*3/uL (ref 0.9–3.3)
MCH: 29 pg (ref 25.1–34.0)
MCHC: 32.9 g/dL (ref 31.5–36.0)
MCV: 88.4 fL (ref 79.5–101.0)
Monocytes Absolute: 0.4 10*3/uL (ref 0.1–0.9)
Monocytes Relative: 8 %
Neutro Abs: 2.9 10*3/uL (ref 1.5–6.5)
Neutrophils Relative %: 60 %
Platelets: 203 10*3/uL (ref 145–400)
RBC: 4.26 MIL/uL (ref 3.70–5.45)
RDW: 15.2 % — ABNORMAL HIGH (ref 11.2–14.5)
WBC: 4.8 10*3/uL (ref 3.9–10.3)

## 2018-03-21 LAB — CMP (CANCER CENTER ONLY)
ALT: 19 U/L (ref 0–44)
AST: 19 U/L (ref 15–41)
Albumin: 3.5 g/dL (ref 3.5–5.0)
Alkaline Phosphatase: 76 U/L (ref 38–126)
Anion gap: 7 (ref 5–15)
BUN: 18 mg/dL (ref 6–20)
CO2: 27 mmol/L (ref 22–32)
Calcium: 9.2 mg/dL (ref 8.9–10.3)
Chloride: 108 mmol/L (ref 98–111)
Creatinine: 0.86 mg/dL (ref 0.44–1.00)
GFR, Est AFR Am: >60 mL/min (ref >60)
GFR, Estimated: >60 mL/min (ref >60)
Glucose, Bld: 94 mg/dL (ref 70–99)
Potassium: 4.5 mmol/L (ref 3.5–5.1)
Sodium: 142 mmol/L (ref 135–145)
Total Bilirubin: <0.2 mg/dL — ABNORMAL LOW (ref 0.3–1.2)
Total Protein: 7.3 g/dL (ref 6.5–8.1)

## 2018-03-21 LAB — PROTIME-INR
INR: 2.14
Prothrombin Time: 23.7 seconds — ABNORMAL HIGH (ref 11.4–15.2)

## 2018-03-21 NOTE — Telephone Encounter (Signed)
Appts scheduled AVS/Calendar printed per 8/22 los °

## 2018-03-23 ENCOUNTER — Encounter: Payer: Self-pay | Admitting: Hematology

## 2018-03-23 MED ORDER — WARFARIN SODIUM 7.5 MG PO TABS: 7.5000 mg | ORAL_TABLET | Freq: Every day | ORAL | 1 refills | Status: DC

## 2018-05-23 ENCOUNTER — Inpatient Hospital Stay: Payer: BC Managed Care – PPO | Attending: Hematology

## 2018-07-18 ENCOUNTER — Inpatient Hospital Stay: Payer: BC Managed Care – PPO | Attending: Hematology

## 2018-07-18 ENCOUNTER — Telehealth: Payer: Self-pay | Admitting: Hematology

## 2018-07-18 NOTE — Telephone Encounter (Signed)
Call pt back per 12/19 sch message - left message for patient to call back to r/s

## 2018-07-30 ENCOUNTER — Telehealth: Payer: Self-pay | Admitting: Hematology

## 2018-07-30 NOTE — Telephone Encounter (Signed)
Tried to call regarding voicemail and the call could not be completed at the time

## 2018-09-19 ENCOUNTER — Telehealth: Payer: Self-pay | Admitting: Hematology

## 2018-09-19 ENCOUNTER — Inpatient Hospital Stay: Payer: BC Managed Care – PPO | Admitting: Hematology

## 2018-09-19 ENCOUNTER — Inpatient Hospital Stay: Payer: BC Managed Care – PPO

## 2018-09-19 NOTE — Telephone Encounter (Signed)
Cancelled appt per 2/20 sch message - left message for patient to call back to r/s

## 2018-10-16 ENCOUNTER — Telehealth: Payer: Self-pay

## 2018-10-16 NOTE — Telephone Encounter (Signed)
Faxed PT/INR results to Dr. Johnsie Kindred (PCP) have not seen patient since 02/2018

## 2018-10-18 ENCOUNTER — Other Ambulatory Visit: Payer: Self-pay | Admitting: Hematology

## 2018-10-18 DIAGNOSIS — I2782 Chronic pulmonary embolism: Secondary | ICD-10-CM

## 2019-01-03 ENCOUNTER — Inpatient Hospital Stay: Admission: RE | Admit: 2019-01-03 | Payer: BC Managed Care – PPO | Source: Ambulatory Visit

## 2019-01-06 ENCOUNTER — Inpatient Hospital Stay: Admission: RE | Admit: 2019-01-06 | Payer: BC Managed Care – PPO | Source: Ambulatory Visit

## 2019-01-06 NOTE — H&P (Signed)
Chief Complaint:   Bianca Simmons is a 53 y.o. female here for Vaginal Bleeding  History of Present Illness: Patient presents for evaluation of postmenopausal bleeding:   Date of last period: last week, before that it was about 10 years ago Bleeding started:  Last week Contributing factors:  None that patient is aware of Petra Kuba of the bleeding (pattern, quantity, duration, postcoital):  Bled as if she was having a normal period, normal in color and flow, lasted 7 days Associated sx (pain, fever, changes in bowel or bladder):  none Medications (anticoagulants, hormones):  Coumadin for hx of PE x2, in 2002 and 2013 Supplements (soy, herbs, dietary):  none Family hx of breast, colon or endometrial cancer:  no Pap smear hx: 2017, No abnormal pap smears  Prior workup: none  ULTRASOUND DONE TODAY AFTER VISIT Results: Uterus=10.39 x 7.23 x 7.41cm Uterus anteverted Fibroids seen: 1)cervical=1.1cm 2)posterior to endometrium=2.7cm 3)posterior=6.3cm 4)anterior=4.5cm Endometrium=6.51mm No free fluid seen B/L ovaries appear wnl  EMBx: rare endometrial tissue that is mainly blood, possible fibroid at internal os  Past Medical History:  has a past medical history of Diabetes mellitus without complication (CMS-HCC) (4481), History of pulmonary embolism (2002 & 2013), Obesity (BMI 30-39.9), unspecified, Pure hypercholesterolemia (LDL 95 - 10/14/18) , and Type 2 diabetes mellitus with hyperlipidemia (A1c 6.3% - 10/14/18).  Past Surgical History:  has a past surgical history that includes Tubal ligation. Family History: family history includes Diabetes type II in her father, mother, sister, and sister; High blood pressure (Hypertension) in her mother; Leukemia in her father; Lymphoma in her father; Stroke in her sister and sister. Social History:  reports that she has never smoked. She has never used smokeless tobacco. She reports that she does not drink alcohol or use drugs. OB/GYN  History:          OB History    Gravida  4   Para  3   Term  3   Preterm      AB  1   Living  3     SAB      TAB      Ectopic      Molar      Multiple      Live Births            Allergies: is allergic to sulfa (sulfonamide antibiotics). Medications:  Current Outpatient Medications:  .  acetaminophen (TYLENOL) 500 MG tablet, Take 500 mg by mouth every 6 (six) hours as needed for Pain., Disp: , Rfl:  .  lisinopriL (ZESTRIL) 5 MG tablet, Take 1 tablet (5 mg total) by mouth once daily, Disp: 30 tablet, Rfl: 3 .  lovastatin (MEVACOR) 20 MG tablet, TAKE 1 TABLET BY MOUTH EVERY DAY AT NIGHT, Disp: 90 tablet, Rfl: 0 .  metFORMIN (GLUCOPHAGE) 500 MG tablet, TAKE 1 TABLET (500 MG TOTAL) BY MOUTH ONCE DAILY WITH FOOD, Disp: 90 tablet, Rfl: 0 .  warfarin (COUMADIN) 5 MG tablet, Take 5 mg by mouth once daily   , Disp: , Rfl:   Review of Systems: General:                      No fatigue or weight loss Eyes:                           No vision changes Ears:  No hearing difficulty Respiratory:                No cough or shortness of breath Pulmonary:                  No asthma or shortness of breath Cardiovascular:           No chest pain, palpitations, dyspnea on exertion Gastrointestinal:          No abdominal bloating, chronic diarrhea, constipations, masses, pain or hematochezia Genitourinary:             See HPI Lymphatic:                   No swollen lymph nodes Musculoskeletal:         No muscle weakness Neurologic:                  No extremity weakness, syncope, seizure disorder Psychiatric:                  No history of depression, delusions or suicidal/homicidal ideation   Exam:      Vitals:   10/21/18 1015  BP: 124/70   Body mass index is 32.08 kg/m.  WD  female in NAD  Pelvic:              External genitalia: vulva /labia no lesions, Tanner stage 5             Urethra: no prolapse             Vagina: normal  physiologic d/c             Cervix: no lesions, no cervical motion tenderness               Uterus: normal size shape and contour, non-tender             Adnexa: no mass,  non-tender               Rectovaginal: external exam normal  Endometrial biopsy: The cervix was cleaned with betadine, topical Hurriciane spray applied, and a single tooth tenaculum is applied to the anterior cervix. The Pipelle catheter was placed into the endometrial cavity. I was not able to get through completely to the top of the uterus, sounding was questionable, 5 cm, and possible insufficient tissue was removed. Endocervical dilation was required. Impression:   The primary encounter diagnosis was Post-menopausal bleeding. A diagnosis of Cervical os stenosis was also pertinent to this visit.  Plan:   1. Postmenopausal bleeding -We discussed the possibilities of atrophy, benign endometrial polyp, hyperplasia and cancer. -We discussed hysteroscopic polypectomy for the result of polyp. -We discussed the possibilities of hyperplasia, treatment option dependent upon grade. -Ultrasound done today after visit, found multiple fibroids, 88mm stripe.  2. Fibroid Uterus and Thickened Endometrial Stripe on Ultrasound with cervical stenosis -D&C hysteroscopy planned because of insufficient tissue, and to review fibroids.. R/B/A discussed  -I will send in Progesterone 10 mg  for patient to take for 14 days a month until definitive tissue diagnosis.

## 2019-01-07 ENCOUNTER — Other Ambulatory Visit
Admission: RE | Admit: 2019-01-07 | Discharge: 2019-01-07 | Disposition: A | Discharge status: Home / Self Care | Payer: BC Managed Care – PPO | Source: Ambulatory Visit | Attending: Obstetrics and Gynecology | Admitting: Obstetrics and Gynecology

## 2019-01-07 ENCOUNTER — Telehealth: Payer: Self-pay | Admitting: *Deleted

## 2019-01-07 ENCOUNTER — Encounter
Admission: RE | Admit: 2019-01-07 | Discharge: 2019-01-07 | Disposition: A | Discharge status: Home / Self Care | Payer: BC Managed Care – PPO | Source: Ambulatory Visit | Attending: Obstetrics and Gynecology | Admitting: Obstetrics and Gynecology

## 2019-01-07 ENCOUNTER — Other Ambulatory Visit: Payer: Self-pay

## 2019-01-07 DIAGNOSIS — Z1159 Encounter for screening for other viral diseases: Secondary | ICD-10-CM | POA: Insufficient documentation

## 2019-01-07 DIAGNOSIS — I10 Essential (primary) hypertension: Secondary | ICD-10-CM | POA: Insufficient documentation

## 2019-01-07 DIAGNOSIS — Z86711 Personal history of pulmonary embolism: Secondary | ICD-10-CM | POA: Insufficient documentation

## 2019-01-07 DIAGNOSIS — Z01818 Encounter for other preprocedural examination: Secondary | ICD-10-CM | POA: Insufficient documentation

## 2019-01-07 HISTORY — DX: Essential (primary) hypertension: I10

## 2019-01-07 LAB — TYPE AND SCREEN
ABO/RH(D): B POS
Antibody Screen: NEGATIVE

## 2019-01-07 LAB — BASIC METABOLIC PANEL
Anion gap: 6 (ref 5–15)
BUN: 13 mg/dL (ref 6–20)
CO2: 24 mmol/L (ref 22–32)
Calcium: 8.5 mg/dL — ABNORMAL LOW (ref 8.9–10.3)
Chloride: 108 mmol/L (ref 98–111)
Creatinine, Ser: 0.81 mg/dL (ref 0.44–1.00)
GFR calc Af Amer: >60 mL/min (ref >60)
GFR calc non Af Amer: >60 mL/min (ref >60)
Glucose, Bld: 90 mg/dL (ref 70–99)
Potassium: 4 mmol/L (ref 3.5–5.1)
Sodium: 138 mmol/L (ref 135–145)

## 2019-01-07 LAB — CBC
HCT: 38.4 % (ref 36.0–46.0)
Hemoglobin: 12.4 g/dL (ref 12.0–15.0)
MCH: 29.7 pg (ref 26.0–34.0)
MCHC: 32.3 g/dL (ref 30.0–36.0)
MCV: 92.1 fL (ref 80.0–100.0)
Platelets: 265 10*3/uL (ref 150–400)
RBC: 4.17 MIL/uL (ref 3.87–5.11)
RDW: 14.4 % (ref 11.5–15.5)
WBC: 6.4 10*3/uL (ref 4.0–10.5)
nRBC: 0 % (ref 0.0–0.2)

## 2019-01-07 LAB — PROTIME-INR
INR: 1.6 — ABNORMAL HIGH (ref 0.8–1.2)
Prothrombin Time: 18.7 seconds — ABNORMAL HIGH (ref 11.4–15.2)

## 2019-01-07 NOTE — Pre-Procedure Instructions (Signed)
Incentive spirometry and carb.drink given.

## 2019-01-07 NOTE — Telephone Encounter (Signed)
Received vm call from pt asking about holding coumadin prior to outpt procedure for this Friday at [facility].  It is for a D & C & hysteroscopy.  Returned call to pt & informed that message would be sent to Dr Burr Medico.

## 2019-01-07 NOTE — Patient Instructions (Addendum)
Your procedure is scheduled on: 01/10/2019 Fri Report to Same Day Surgery 2nd floor medical mall [facility] Entrance-take elevator on left to 2nd floor.  Check in with surgery information desk.) To find out your arrival time please call [phone] between 1PM - 3PM on 01/09/2019  Remember: Instructions that are not followed completely may result in serious medical risk, up to and including death, or upon the discretion of your surgeon and anesthesiologist your surgery may need to be rescheduled.    _x___ 1. Do not eat food after midnight the night before your procedure. You may drink clear liquids up to 2 hours before you are scheduled to arrive at the hospital for your procedure.  Do not drink clear liquids within 2 hours of your scheduled arrival to the hospital.  Clear liquids include  --Water or Apple juice without pulp  --Clear carbohydrate beverage such as ClearFast or Gatorade  --Black Coffee or Clear Tea (No milk, no creamers, do not add anything to                  the coffee or Tea Type 1 and type 2 diabetics should only drink water.   ____Ensure clear carbohydrate drink on the way to the hospital for bariatric patients  ____Ensure clear carbohydrate drink 3 hours before surgery for Dr Dwyane Luo patients if physician instructed.   No gum chewing or hard candies.     __x__ 2. No Alcohol for 24 hours before or after surgery.   __x__3. No Smoking or e-cigarettes for 24 prior to surgery.  Do not use any chewable tobacco products for at least 6 hour prior to surgery   ____  4. Bring all medications with you on the day of surgery if instructed.    __x__ 5. Notify your doctor if there is any change in your medical condition     (cold, fever, infections).    x___6. On the morning of surgery brush your teeth with toothpaste and water.  You may rinse your mouth with mouth wash if you wish.  Do not swallow any toothpaste or mouthwash.   Do not wear jewelry, make-up, hairpins,  clips or nail polish.  Do not wear lotions, powders, or perfumes. You may wear deodorant.  Do not shave 48 hours prior to surgery. Men may shave face and neck.  Do not bring valuables to the hospital.    [facility] is not responsible for any belongings or valuables.               Contacts, dentures or bridgework may not be worn into surgery.  Leave your suitcase in the car. After surgery it may be brought to your room.  For patients admitted to the hospital, discharge time is determined by your                       treatment team.  _  Patients discharged the day of surgery will not be allowed to drive home.  You will need someone to drive you home and stay with you the night of your procedure.    Please read over the following fact sheets that you were given:   [facility] Preparing for Surgery and or MRSA Information   _x___ Take anti-hypertensive listed below, cardiac, seizure, asthma,     anti-reflux and psychiatric medicines. These include:  1. None  2.  3.  4.  5.  6.  ____Fleets enema or Magnesium Citrate as directed.  ____ Use CHG Soap or sage wipes as directed on instruction sheet   ____ Use inhalers on the day of surgery and bring to hospital day of surgery  _x___ Stop Metformin and Janumet 2 days prior to surgery.    ____ Take 1/2 of usual insulin dose the night before surgery and none on the morning     surgery.   _x___ Follow recommendations from Cardiologist, Pulmonologist or PCP regarding          stopping Aspirin, Coumadin, Plavix ,Eliquis, Effient, or Pradaxa, and Pletal.  X____Stop Anti-inflammatories such as Advil, Aleve, Ibuprofen, Motrin, Naproxen, Naprosyn, Goodies powders or aspirin products. OK to take Tylenol and                          Celebrex.   _x___ Stop supplements until after surgery.  But may continue Vitamin D, Vitamin B,       and multivitamin.   ____ Bring C-Pap to the hospital.

## 2019-01-07 NOTE — Telephone Encounter (Signed)
I called her back and recommend her to stop Coumadin for now for the procedure, and restart the day after procedure if OK with her GYN. She has not been check her INR regularly, but agrees to return for lab and f/u with [place] in 2-3 weeks, will send a schedule message.   Truitt Merle MD

## 2019-01-08 ENCOUNTER — Telehealth: Payer: Self-pay | Admitting: Hematology

## 2019-01-08 LAB — NOVEL CORONAVIRUS, NAA (HOSP ORDER, SEND-OUT TO REF LAB; TAT 18-24 HRS): SARS-CoV-2, NAA: NOT DETECTED

## 2019-01-08 NOTE — Telephone Encounter (Signed)
Scheduled appt per sch msg. Called and spoke with patient. Confirmed date and time °

## 2019-01-09 NOTE — Pre-Procedure Instructions (Signed)
PT results sent to Dr. Leafy Ro for review.

## 2019-01-10 ENCOUNTER — Ambulatory Visit: Payer: BC Managed Care – PPO | Admitting: Certified Registered"

## 2019-01-10 ENCOUNTER — Encounter: Payer: Self-pay | Admitting: *Deleted

## 2019-01-10 ENCOUNTER — Encounter
Admission: RE | Disposition: A | Discharge status: Home / Self Care | Payer: Self-pay | Source: Home / Self Care | Attending: Obstetrics and Gynecology

## 2019-01-10 ENCOUNTER — Ambulatory Visit
Admission: RE | Admit: 2019-01-10 | Discharge: 2019-01-10 | Disposition: A | Discharge status: Home / Self Care | Payer: BC Managed Care – PPO | Attending: Obstetrics and Gynecology | Admitting: Obstetrics and Gynecology

## 2019-01-10 ENCOUNTER — Other Ambulatory Visit: Payer: Self-pay

## 2019-01-10 DIAGNOSIS — E669 Obesity, unspecified: Secondary | ICD-10-CM | POA: Diagnosis not present

## 2019-01-10 DIAGNOSIS — Z7901 Long term (current) use of anticoagulants: Secondary | ICD-10-CM | POA: Insufficient documentation

## 2019-01-10 DIAGNOSIS — Z86711 Personal history of pulmonary embolism: Secondary | ICD-10-CM | POA: Insufficient documentation

## 2019-01-10 DIAGNOSIS — E785 Hyperlipidemia, unspecified: Secondary | ICD-10-CM | POA: Diagnosis not present

## 2019-01-10 DIAGNOSIS — Z6832 Body mass index (BMI) 32.0-32.9, adult: Secondary | ICD-10-CM | POA: Insufficient documentation

## 2019-01-10 DIAGNOSIS — N950 Postmenopausal bleeding: Secondary | ICD-10-CM | POA: Insufficient documentation

## 2019-01-10 DIAGNOSIS — D259 Leiomyoma of uterus, unspecified: Secondary | ICD-10-CM | POA: Diagnosis not present

## 2019-01-10 DIAGNOSIS — E7800 Pure hypercholesterolemia, unspecified: Secondary | ICD-10-CM | POA: Diagnosis not present

## 2019-01-10 DIAGNOSIS — E119 Type 2 diabetes mellitus without complications: Secondary | ICD-10-CM | POA: Insufficient documentation

## 2019-01-10 DIAGNOSIS — Z7984 Long term (current) use of oral hypoglycemic drugs: Secondary | ICD-10-CM | POA: Insufficient documentation

## 2019-01-10 DIAGNOSIS — I10 Essential (primary) hypertension: Secondary | ICD-10-CM | POA: Diagnosis not present

## 2019-01-10 DIAGNOSIS — Z882 Allergy status to sulfonamides status: Secondary | ICD-10-CM | POA: Diagnosis not present

## 2019-01-10 DIAGNOSIS — Z79899 Other long term (current) drug therapy: Secondary | ICD-10-CM | POA: Diagnosis not present

## 2019-01-10 DIAGNOSIS — I2782 Chronic pulmonary embolism: Secondary | ICD-10-CM

## 2019-01-10 HISTORY — PX: DILATATION & CURETTAGE/HYSTEROSCOPY WITH MYOSURE: SHX6511

## 2019-01-10 HISTORY — PX: HYSTEROSCOPY WITH D & C: SHX1775

## 2019-01-10 LAB — ABO/RH: ABO/RH(D): B POS

## 2019-01-10 LAB — GLUCOSE, CAPILLARY
Glucose-Capillary: 71 mg/dL (ref 70–99)
Glucose-Capillary: 62 mg/dL — ABNORMAL LOW (ref 70–99)
Glucose-Capillary: 157 mg/dL — ABNORMAL HIGH (ref 70–99)
Glucose-Capillary: 53 mg/dL — ABNORMAL LOW (ref 70–99)
Glucose-Capillary: 138 mg/dL — ABNORMAL HIGH (ref 70–99)

## 2019-01-10 LAB — PROTIME-INR
INR: 1.2 (ref 0.8–1.2)
Prothrombin Time: 15 seconds (ref 11.4–15.2)

## 2019-01-10 SURGERY — DILATATION AND CURETTAGE /HYSTEROSCOPY
Anesthesia: General

## 2019-01-10 MED ORDER — FENTANYL CITRATE (PF) 100 MCG/2ML IJ SOLN
INTRAMUSCULAR | Status: AC
  Filled 2019-01-10: qty 2

## 2019-01-10 MED ORDER — GLYCOPYRROLATE 0.2 MG/ML IJ SOLN
INTRAMUSCULAR | Status: DC | PRN
  Administered 2019-01-10: 0.2 mg via INTRAVENOUS

## 2019-01-10 MED ORDER — DEXAMETHASONE SODIUM PHOSPHATE 4 MG/ML IJ SOLN
INTRAMUSCULAR | Status: AC
  Filled 2019-01-10: qty 1

## 2019-01-10 MED ORDER — FAMOTIDINE 20 MG PO TABS
20.0000 mg | ORAL_TABLET | Freq: Once | ORAL | Status: AC
  Administered 2019-01-10: 20 mg via ORAL

## 2019-01-10 MED ORDER — MIDAZOLAM HCL 2 MG/2ML IJ SOLN
INTRAMUSCULAR | Status: DC | PRN
  Administered 2019-01-10: 2 mg via INTRAVENOUS

## 2019-01-10 MED ORDER — FENTANYL CITRATE (PF) 100 MCG/2ML IJ SOLN
INTRAMUSCULAR | Status: DC | PRN
  Administered 2019-01-10 (×2): 50 ug via INTRAVENOUS

## 2019-01-10 MED ORDER — IBUPROFEN 800 MG PO TABS: 800.0000 mg | ORAL_TABLET | Freq: Three times a day (TID) | ORAL | 1 refills | Status: AC

## 2019-01-10 MED ORDER — ONDANSETRON HCL 4 MG/2ML IJ SOLN: 4.0000 mg | Freq: Once | INTRAMUSCULAR | Status: DC | PRN

## 2019-01-10 MED ORDER — PROPOFOL 10 MG/ML IV BOLUS
INTRAVENOUS | Status: DC | PRN
  Administered 2019-01-10: 150 mg via INTRAVENOUS
  Administered 2019-01-10: 50 mg via INTRAVENOUS

## 2019-01-10 MED ORDER — KETOROLAC TROMETHAMINE 30 MG/ML IJ SOLN
INTRAMUSCULAR | Status: AC
  Administered 2019-01-10: 30 mg via INTRAVENOUS
  Filled 2019-01-10: qty 1

## 2019-01-10 MED ORDER — DEXTROSE 50 % IV SOLN
25.0000 mL | Freq: Once | INTRAVENOUS | Status: AC
  Administered 2019-01-10: 25 mL via INTRAVENOUS

## 2019-01-10 MED ORDER — DEXTROSE-NACL 5-0.2 % IV SOLN
INTRAVENOUS | Status: DC
  Administered 2019-01-10: 17:00:00 via INTRAVENOUS

## 2019-01-10 MED ORDER — KETOROLAC TROMETHAMINE 30 MG/ML IJ SOLN
30.0000 mg | Freq: Once | INTRAMUSCULAR | Status: AC
  Administered 2019-01-10: 17:00:00 30 mg via INTRAVENOUS

## 2019-01-10 MED ORDER — PROPOFOL 10 MG/ML IV BOLUS
INTRAVENOUS | Status: AC
  Filled 2019-01-10: qty 20

## 2019-01-10 MED ORDER — LACTATED RINGERS IV SOLN: INTRAVENOUS | Status: DC

## 2019-01-10 MED ORDER — FENTANYL CITRATE (PF) 100 MCG/2ML IJ SOLN: 25.0000 ug | INTRAMUSCULAR | Status: DC | PRN

## 2019-01-10 MED ORDER — SODIUM CHLORIDE 0.9 % IV SOLN
INTRAVENOUS | Status: DC
  Administered 2019-01-10: 13:00:00 via INTRAVENOUS

## 2019-01-10 MED ORDER — DEXTROSE-NACL 5-0.9 % IV SOLN
INTRAVENOUS | Status: DC
  Administered 2019-01-10: 18:00:00 via INTRAVENOUS

## 2019-01-10 MED ORDER — FAMOTIDINE 20 MG PO TABS
ORAL_TABLET | ORAL | Status: AC
  Administered 2019-01-10: 20 mg via ORAL
  Filled 2019-01-10: qty 1

## 2019-01-10 MED ORDER — LIDOCAINE HCL (CARDIAC) PF 100 MG/5ML IV SOSY
PREFILLED_SYRINGE | INTRAVENOUS | Status: DC | PRN
  Administered 2019-01-10: 100 mg via INTRAVENOUS

## 2019-01-10 MED ORDER — DEXAMETHASONE SODIUM PHOSPHATE 10 MG/ML IJ SOLN
INTRAMUSCULAR | Status: DC | PRN
  Administered 2019-01-10: 4 mg via INTRAVENOUS

## 2019-01-10 MED ORDER — MIDAZOLAM HCL 2 MG/2ML IJ SOLN
INTRAMUSCULAR | Status: AC
  Filled 2019-01-10: qty 2

## 2019-01-10 MED ORDER — PHENYLEPHRINE HCL (PRESSORS) 10 MG/ML IV SOLN
INTRAVENOUS | Status: DC | PRN
  Administered 2019-01-10 (×2): 100 ug via INTRAVENOUS
  Administered 2019-01-10: 200 ug via INTRAVENOUS

## 2019-01-10 MED ORDER — ONDANSETRON HCL 4 MG/2ML IJ SOLN
INTRAMUSCULAR | Status: DC | PRN
  Administered 2019-01-10: 4 mg via INTRAVENOUS

## 2019-01-10 MED ORDER — DEXTROSE 50 % IV SOLN
INTRAVENOUS | Status: AC
  Filled 2019-01-10: qty 50

## 2019-01-10 MED ORDER — ONDANSETRON HCL 4 MG/2ML IJ SOLN
INTRAMUSCULAR | Status: AC
  Filled 2019-01-10: qty 2

## 2019-01-10 SURGICAL SUPPLY — 20 items
BAG INFUSER PRESSURE 100CC (MISCELLANEOUS) ×2 IMPLANT
CANISTER SUC SOCK COL 7IN (MISCELLANEOUS) ×2 IMPLANT
CANISTER SUCT 3000ML PPV (MISCELLANEOUS) ×2 IMPLANT
CATH ROBINSON RED A/P 16FR (CATHETERS) ×2 IMPLANT
COVER WAND RF STERILE (DRAPES) ×2 IMPLANT
DEVICE MYOSURE LITE (MISCELLANEOUS) IMPLANT
ELECT REM PT RETURN 9FT ADLT (ELECTROSURGICAL) ×2
ELECTRODE REM PT RTRN 9FT ADLT (ELECTROSURGICAL) ×1 IMPLANT
GLOVE BIO SURGEON STRL SZ7 (GLOVE) ×2 IMPLANT
GLOVE INDICATOR 7.5 STRL GRN (GLOVE) ×2 IMPLANT
GOWN STRL REUS W/ TWL LRG LVL3 (GOWN DISPOSABLE) ×2 IMPLANT
GOWN STRL REUS W/TWL LRG LVL3 (GOWN DISPOSABLE) ×2
KIT PROCEDURE FLUENT (KITS) ×2 IMPLANT
KIT TURNOVER CYSTO (KITS) ×2 IMPLANT
PACK DNC HYST (MISCELLANEOUS) ×2 IMPLANT
PAD OB MATERNITY 4.3X12.25 (PERSONAL CARE ITEMS) ×2 IMPLANT
PAD PREP 24X41 OB/GYN DISP (PERSONAL CARE ITEMS) ×2 IMPLANT
SOL .9 NS 3000ML IRR  AL (IV SOLUTION) ×1
SOL .9 NS 3000ML IRR UROMATIC (IV SOLUTION) ×1 IMPLANT
TUBING CONNECTING 10 (TUBING) ×2 IMPLANT

## 2019-01-10 NOTE — Anesthesia Preprocedure Evaluation (Signed)
Anesthesia Evaluation  Patient identified by MRN, date of birth, ID band Patient awake    Reviewed: Allergy & Precautions, H&P , NPO status , Patient's Chart, lab work & pertinent test results, reviewed documented beta blocker date and time   Airway Mallampati: II  TM Distance: >3 FB Neck ROM: full    Dental  (+) Teeth Intact   Pulmonary neg pulmonary ROS,    Pulmonary exam normal        Cardiovascular Exercise Tolerance: Good hypertension, negative cardio ROS Normal cardiovascular exam Rate:Normal     Neuro/Psych negative neurological ROS  negative psych ROS   GI/Hepatic negative GI ROS, Neg liver ROS,   Endo/Other  negative endocrine ROSdiabetes  Renal/GU negative Renal ROS  negative genitourinary   Musculoskeletal   Abdominal   Peds  Hematology negative hematology ROS (+)   Anesthesia Other Findings   Reproductive/Obstetrics negative OB ROS                             Anesthesia Physical Anesthesia Plan  ASA: III  Anesthesia Plan: General LMA   Post-op Pain Management:    Induction:   PONV Risk Score and Plan:   Airway Management Planned:   Additional Equipment:   Intra-op Plan:   Post-operative Plan:   Informed Consent: I have reviewed the patients History and Physical, chart, labs and discussed the procedure including the risks, benefits and alternatives for the proposed anesthesia with the patient or authorized representative who has indicated his/her understanding and acceptance.       Plan Discussed with: CRNA  Anesthesia Plan Comments:         Anesthesia Quick Evaluation

## 2019-01-10 NOTE — Op Note (Signed)
Operative Report Hysteroscopy with Dilation and Curettage   Indications: Postmenopausal bleeding   Pre-operative Diagnosis: Fibroids on anticoagulation  Post-operative Diagnosis: Same  Procedure: 1. Exam under anesthesia 2. Fractional D&C 3. Hysteroscopy 4. Myosure endometrial sampling  Surgeon: Benjaman Kindler, MD  Assistant(s):  None  Anesthesia: General LMA anesthesia  Anesthesiologist: Gunnar Bulla, MD Anesthesiologist: Gunnar Bulla, MD CRNA: Hedda Slade, CRNA  Estimated Blood Loss:  Minimal         Total IV Fluids: 554ml  Urine Output: 225ml  Total Fluid Deficit:  950 mL          Specimens: Endocervical curettings, endometrial curettings         Complications:  None; patient tolerated the procedure well.         Disposition: PACU - hemodynamically stable.         Condition: stable  Findings: Uterus measuring 10 cm by sound; normal cervix, vagina, perineum. Endometrial cavity with scar tissue, no fibroids visualized. Deep endometrial septum vs false passage created by dilators vs prior ablation scarring, with visualization of either an attenuated left horn or left fallopian tubal ostium.  Indication for procedure/Consents: 53 y.o.  here for scheduled surgery for the aforementioned diagnoses.  Risks of surgery were discussed with the patient including but not limited to: bleeding which may require transfusion; infection which may require antibiotics; injury to uterus or surrounding organs; intrauterine scarring which may impair future fertility; need for additional procedures including laparotomy or laparoscopy; and other postoperative/anesthesia complications. Written informed consent was obtained.    Procedure Details:   D&C/ Myosure  The patient was taken to the operating room where anesthesia was administered and was found to be adequate. After a formal and adequate timeout was performed, she was placed in the dorsal lithotomy position and examined with  the above findings. She was then prepped and draped in the sterile manner. Her bladder was catheterized for an estimated amount of clear, yellow urine. A weighed speculum was then placed in the patient's vagina and a single tooth tenaculum was applied to the anterior lip of the cervix.  Her cervix was serially dilated to 15 [place] using Hanks dilators. An ECC was performed. Uterus sounded to 10cm. The hysteroscope was introduced under direct observation using lactated ringers as a distention medium to reveal the above findings. Her cavity was fluffy but appears to be scar tissue without evidence of submucoal fibroid. Sampling throughout under direct visualization using the Myosure device.  After further careful visualization of the uterine cavity, the hysteroscope was removed under direct visualization.  The tenaculum was removed from the anterior lip of the cervix and the vaginal speculum was removed after applying silver nitrate for good hemostasis.   The patient tolerated the procedure well and was taken to the recovery area awake and in stable condition. She received iv acetaminophen and Toradol prior to leaving the OR.  The patient will be discharged to home as per PACU criteria. Routine postoperative instructions given. She was prescribed Ibuprofen and Colace. She will follow up in the clinic in two weeks for postoperative evaluation.

## 2019-01-10 NOTE — Discharge Instructions (Signed)
You may restart your coumadin tomorrow   Dilation and Curettage or Vacuum Curettage, Care After This sheet gives you information about how to care for yourself after your procedure. Your health care provider may also give you more specific instructions. If you have problems or questions, contact your health care provider. What can I expect after the procedure? After your procedure, it is common to have:  Mild pain or cramping.  Some vaginal bleeding or spotting. These may last for up to 2 weeks after your procedure. Follow these instructions at home: Activity   Do not drive or use heavy machinery while taking prescription pain medicine.  Avoid driving for the first 24 hours after your procedure.  Take frequent, short walks, followed by rest periods, throughout the day. Ask your health care provider what activities are safe for you. After 1-2 days, you may be able to return to your normal activities.  Do not lift anything heavier than 10 lb (4.5 kg) until your health care provider approves.  For at least 2 weeks, or as long as told by your health care provider, do not: ? Douche. ? Use tampons. ? Have sexual intercourse. General instructions   Take over-the-counter and prescription medicines only as told by your health care provider. This is especially important if you take blood thinning medicine.  Do not take baths, swim, or use a hot tub until your health care provider approves. Take showers instead of baths.  Wear compression stockings as told by your health care provider. These stockings help to prevent blood clots and reduce swelling in your legs.  It is your responsibility to get the results of your procedure. Ask your health care provider, or the department performing the procedure, when your results will be ready.  Keep all follow-up visits as told by your health care provider. This is important. Contact a health care provider if:  You have severe cramps that get  worse or that do not get better with medicine.  You have severe abdominal pain.  You cannot drink fluids without vomiting.  You develop pain in a different area of your pelvis.  You have bad-smelling vaginal discharge.  You have a rash. Get help right away if:  You have vaginal bleeding that soaks more than one sanitary pad in 1 hour, for 2 hours in a row.  You pass large blood clots from your vagina.  You have a fever that is above 100.37F (38.0C).  Your abdomen feels very tender or hard.  You have chest pain.  You have shortness of breath.  You cough up blood.  You feel dizzy or light-headed.  You faint.  You have pain in your neck or shoulder area. This information is not intended to replace advice given to you by your health care provider. Make sure you discuss any questions you have with your health care provider. Document Released: 07/14/2000 Document Revised: 03/15/2016 Document Reviewed: 02/17/2016 Elsevier Interactive Patient Education  [employer].     Here is a helpful article from the website [employer], regarding constipation  Here are reasons why constipation occurs after surgery: 1) During the operation and in the recovery room, most people are given opioid pain medication, primarily through an IV, to treat moderate or severe pain. Intravenous opioids include morphine, Dilaudid and fentanyl. After surgery, patients are often prescribed opioid pain medication to take by mouth at home, including codeine, Vicodin, Norco, and Percocet. All of these medications cause constipation by slowing down the movement of your  intestine. 2) Changes in your diet before surgery can be another culprit. It is common to get specific instructions to change how you normally eat or drink before your surgery, like only having liquids the day before or not having anything to eat or drink after midnight the night before surgery. For this reason, temporary dehydration may  occur. This, along with not eating or only having liquids, means that you are getting less fiber than usual. Both these factors contribute to constipation. 3) Changes in your diet after surgery can also contribute to the problem. Although many people dont have dietary restrictions after operations, being under anesthesia can make you lose your appetite for several hours and maybe even days. Some people can even have nausea or vomiting. Not eating or drinking normally means that you are not getting enough fiber and you can get dehydrated, both leading to constipation. 4) Lying in a bed more than usual--which happens before, during and after surgery--combined with the medications and diet changes, all work together to slow down your colon and make your poop turn to rock.  No one likes to be constipated.  Lets face it, its not a pleasant feeling when you dont poop for days, then strain on the toilet to finally pass something large enough to cause damage. An ounce of prevention is worth a pound of cure, so: 1. Assume you will be constipated. 2. Plan and prepare accordingly. Post-surgery is one of those unique situations where the temporary use of laxatives can make a world of difference. Always consult with your doctor, and recognize that if you wait several days after surgery to take a laxative, the constipation might be too severe for these over-the-counter options. It is always important to discuss all medications you plan on taking with your doctor. Ask your doctor if you can start the laxative immediately after surgery. *  Here are go-to post-surgery laxatives: Senna: Senna is an herb that acts as a stimulant laxative, meaning it increases the activity of the intestine to cause you to have a bowel movement. It comes in many forms, but senna pills are easy to take and are sold over the counter at almost all pharmacies. Since opioid pain medications slow down the activity of the intestine, it makes  sense to take a medication to help reverse that side effect. Long-term use of a stimulant laxative is not a good idea since it can make your colon lazy and not function properly; however, temporary use immediately after surgery is acceptable. In general, if you are able to eat a normal diet, taking senna soon after surgery works the best. Senna usually works within hours to produce a bowel movement, but this is less predictable when you are taking different medications after surgery. Try not to wait several days to start taking senna, as often it is too late by then. Just like with all medications or supplements, check with your doctor before starting new treatment.   Magnesium: Magnesium is an important mineral that our body needs. We get magnesium from some foods that we eat, especially foods that are high in fiber such as broccoli, almonds and whole grains. There are also magnesium-based medications used to treat constipation including milk of magnesia (magnesium hydroxide), magnesium citrate and magnesium oxide. They work by drawing water into the intestine, putting it into the class of osmotic laxatives. Magnesium products in low doses appear to be safe, but if taken in very large doses, can lead to problems such as irregular heartbeat,  low blood pressure and even death. It can also affect other medications you might be taking, therefore it is important to discuss using magnesium with your physician and pharmacist before initiating therapy. Most over-the-counter magnesium laxatives work very well to help with the constipation related to surgery, but sometimes they work too well and lead to diarrhea. Make sure you are somewhere with easy access to a bathroom, just in case.   Bisacodyl: Bisacodyl (generic name) is sold under brand names such as Dulcolax. Much like senna, it is a stimulant laxative, meaning it makes your intestines move more quickly to push out the stool. This is another good choice to  start taking as soon as your doctor says you can take a laxative after surgery. It comes in pill form and as a suppository, which is a good choice for people who cannot or are not allowed to swallow pills. Studies have shown that it works as a laxative, but like most of these medications, you should use this on a short-term basis only.   Enema: Enemas strike fear in many people, but FEAR NOT! Its nowhere near as big a deal as you may think. An enema is just a way to get some liquid into your rectum by placing a specially designed device through your anus. If you have never done one, it might seem like a painful, unpleasant, uncomfortable, complicated and lengthy procedure. But in reality, its simple, takes just a few seconds and is highly effective. The small ready-made bottles you buy at the pharmacy are much easier than the hose/large rubber container type. Those recommended positions illustrated in some instructions are generally not necessary to place the enema. Its very similar to the insertion of a tampon, requiring a slight squat. Some extra lubrication on the enemas tip (or on your anus) will make it a breeze. In certain cases, there is no substitute for a good enema. For example, if someone has not pooped for a few days, the beginning of the poop waiting to come out can become rock hard. Passing that hard stool can lead to much pain and problems like anal fissures. Inserting a little liquid to break up the rock-hard stool will help make its passage much easier. Enemas come with different liquids. Most come with saline, but there are also mineral oil options. You can also use warm water in the reusable enema containers. They all work. But since saline can sometimes be irritating, so try a mineral oil or water enema instead.  Here are commonly recommended constipation medications that do not work well for post-surgery constipation: Docusate: Docusate (generic name) most commonly referred to as  Colace (brand name) is not really a laxative, but is classified as a stool softener. Although this medication is commonly prescribed, it is not recommended for several reasons: 1) there is no good medical evidence that it works 2) even if it has an effect, which is very questionable, it is minimal and cannot combat the intestinal slowing caused by the opioid medications. Skip docusate to save money and space in your pillbox for something more effective.  PEG: Miralax (brand name) is basically a chemical called polyethylene glycol (PEG) and it has gained tremendous popularity as a laxative. This product is an osmotic laxative meaning it works by pulling water into the stool, making it softer. This is very similar to the action of natural fiber in foods and supplements. Therefore, the effect seen by this medication is not immediate, causing a bowel movement in a  day or more. Is this medication strong enough to battle the constipation related to having an operation? Maybe for some people not prone to constipation. But for most people, other laxatives are better to prevent constipation after surgery.

## 2019-01-10 NOTE — Anesthesia Post-op Follow-up Note (Signed)
Anesthesia QCDR form completed.        

## 2019-01-10 NOTE — Interval H&P Note (Signed)
History and Physical Interval Note:  01/10/2019 12:00 PM  Bianca Simmons  has presented today for surgery, with the diagnosis of postmenopausal bleeding, cervical stenosis.  The various methods of treatment have been discussed with the patient and family. After consideration of risks, benefits and other options for treatment, the patient has consented to  Procedure(s): DILATATION AND CURETTAGE /HYSTEROSCOPY (N/A) [place] (N/A) as a surgical intervention.  The patient's history has been reviewed, patient examined, no change in status, stable for surgery.  I have reviewed the patient's chart and labs.  Questions were answered to the patient's satisfaction.     Benjaman Kindler

## 2019-01-10 NOTE — Transfer of Care (Signed)
Immediate Anesthesia Transfer of Care Note  Patient: Bianca Simmons  Procedure(s) Performed: DILATATION AND CURETTAGE /HYSTEROSCOPY (N/A ) DILATATION & CURETTAGE/HYSTEROSCOPY WITH MYOSURE (N/A )  Patient Location: PACU  Anesthesia Type:General  Level of Consciousness: sedated  Airway & Oxygen Therapy: Patient Spontanous Breathing and Patient connected to face mask oxygen  Post-op Assessment: Report given to RN and Post -op Vital signs reviewed and stable  Post vital signs: Reviewed and stable  Last Vitals:  Vitals Value Taken Time  BP 111/66 01/10/19 1556  Temp 35.7 C 01/10/19 1556  Pulse 71 01/10/19 1556  Resp 13 01/10/19 1556  SpO2 100 % 01/10/19 1556  Vitals shown include unvalidated device data.  Last Pain:  Vitals:   01/10/19 1556  TempSrc:   PainSc: 0-No pain         Complications: No apparent anesthesia complications

## 2019-01-10 NOTE — Anesthesia Postprocedure Evaluation (Signed)
Anesthesia Post Note  Patient: Bianca Simmons  Procedure(s) Performed: DILATATION AND CURETTAGE /HYSTEROSCOPY (N/A ) DILATATION & CURETTAGE/HYSTEROSCOPY WITH MYOSURE (N/A )  Patient location during evaluation: PACU Anesthesia Type: General Level of consciousness: awake and alert Pain management: pain level controlled Vital Signs Assessment: post-procedure vital signs reviewed and stable Respiratory status: spontaneous breathing, nonlabored ventilation, respiratory function stable and patient connected to nasal cannula oxygen Cardiovascular status: blood pressure returned to baseline and stable Postop Assessment: no apparent nausea or vomiting Anesthetic complications: no     Last Vitals:  Vitals:   01/10/19 1753 01/10/19 1821  BP:    Pulse:    Resp:    Temp: (!) 36.1 C 36.8 C  SpO2:      Last Pain:  Vitals:   01/10/19 1740  TempSrc: Tympanic  PainSc: 0-No pain                 Alyus Mofield S

## 2019-01-10 NOTE — Anesthesia Procedure Notes (Signed)
Procedure Name: LMA Insertion Date/Time: 01/10/2019 3:07 PM Performed by: Hedda Slade, CRNA Pre-anesthesia Checklist: Patient identified, Patient being monitored, Timeout performed, Emergency Drugs available and Suction available Patient Re-evaluated:Patient Re-evaluated prior to induction Oxygen Delivery Method: Circle system utilized Preoxygenation: Pre-oxygenation with 100% oxygen Induction Type: IV induction Ventilation: Mask ventilation without difficulty LMA: LMA inserted LMA Size: 3.5 Tube type: Oral Number of attempts: 1 Placement Confirmation: positive ETCO2 and breath sounds checked- equal and bilateral Tube secured with: Tape Dental Injury: Teeth and Oropharynx as per pre-operative assessment

## 2019-01-11 ENCOUNTER — Encounter: Payer: Self-pay | Admitting: Obstetrics and Gynecology

## 2019-01-14 LAB — SURGICAL PATHOLOGY

## 2019-01-27 NOTE — Progress Notes (Signed)
Bianca Simmons   [phone] Fax:([phone]   Clinic Follow up Note   Patient Care Team: Dion Body, MD as PCP - General (Family Medicine)  Date of Service:  01/29/2019  CHIEF COMPLAINT: H/o DVT and PE  SUMMARY OF ONCOLOGIC HISTORY: 1. Pulmonary embolism, acute bilateral with high clot burden in association with an asymptomatic DVT of the right lower leg  diagnosed on 06/07/2012. She was treated with Xarelto (rivaroxaban). Doppler study of the right leg carried out on 08/20/2012 and CT angiogram of the chest carried out on 08/23/2012 showed resolution of previous blood clots. Hypercoagulable workup carried out on 06/08/2012 was negative  2. DVT involving the right lower extremity, asymptomatic at presentation with clots seen in the right distal popliteal, peroneal, and posterior tibial veins on Doppler study from 06/08/2012.  3. History of pulmonary embolism in association with birth control pills in 2001.  4. Unprovoked bilateral PE on 05/18/2016  CURRENT THERAPY:   coumadin  7.5mg  daily, increase to 10mg  daily on 01/30/2019  INTERVAL HISTORY:  Bianca Simmons is here for a follow up of PE/DVT. She presents to the clinic alone. Since having post-menopausal uterine bleeding, she had D&C which was benign. This has resolved since. She was off Coumadin for about 3 days. She also notes leg cramps in the morning.    REVIEW OF SYSTEMS:   Constitutional: Denies fevers, chills or abnormal weight loss  Eyes: Denies blurriness of vision Ears, nose, mouth, throat, and face: Denies mucositis or sore throat Respiratory: Denies cough, dyspnea or wheezes Cardiovascular: Denies palpitation, chest discomfort or lower extremity swelling Gastrointestinal:  Denies nausea, heartburn or change in bowel habits Skin: Denies abnormal skin rashes MSK: (+) Leg cramps  Lymphatics: Denies new lymphadenopathy or easy bruising Neurological:Denies numbness, tingling or new  weaknesses Behavioral/Psych: Mood is stable, no new changes  All other systems were reviewed with the patient and are negative.  MEDICAL HISTORY:  Past Medical History:  Diagnosis Date  . Diabetes mellitus without complication ([place])   . Hyperlipidemia   . Hypertension   . Pulmonary embolism, blood-clot, obstetric 2002   on birth control-pulm blood clot-treated with coumadin-no problems now    SURGICAL HISTORY: Past Surgical History:  Procedure Laterality Date  . DILATATION & CURETTAGE/HYSTEROSCOPY WITH MYOSURE N/A 01/10/2019   Procedure: DILATATION & CURETTAGE/HYSTEROSCOPY WITH MYOSURE;  Surgeon: Benjaman Kindler, MD;  Location: [facility];  Service: Gynecology;  Laterality: N/A;  . HYSTEROSCOPY W/D&C N/A 01/10/2019   Procedure: DILATATION AND CURETTAGE /HYSTEROSCOPY;  Surgeon: Benjaman Kindler, MD;  Location: [facility];  Service: Gynecology;  Laterality: N/A;  . IUD REMOVAL  OCT 2012  . LAPAROSCOPIC TUBAL LIGATION  07/14/2011   Procedure: LAPAROSCOPIC TUBAL LIGATION;  Surgeon: Agnes Lawrence, MD;  Location: [place] [facility];  Service: Gynecology;  Laterality: N/A;  . NO PAST SURGERIES    . TUBAL LIGATION  04/21/2011   Procedure: ESSURE TUBAL STERILIZATION;  Surgeon: Agnes Lawrence, MD;  Location: [place] [facility];  Service: Gynecology;  Laterality: Bilateral;   attempted essure,removal of IUD, hysteroscopy,         dilatation and currettage  . vaginal ablasion      I have reviewed the social history and family history with the patient and they are unchanged from previous note.  ALLERGIES:  is allergic to sulfa antibiotics.  MEDICATIONS:  Current Outpatient Medications  Medication Sig Dispense Refill  . acetaminophen (TYLENOL) 500 MG tablet Take by mouth as needed.    Marland Kitchen lisinopril (ZESTRIL) 5  MG tablet Take 5 mg by mouth daily.    Marland Kitchen lovastatin (MEVACOR) 20 MG tablet Take 20 mg by mouth at bedtime.    . metFORMIN (GLUCOPHAGE) 500 MG tablet Take 500 mg by mouth daily with breakfast.     .  naproxen sodium (ANAPROX) 220 MG tablet Take 220 mg by mouth 2 (two) times daily as needed (for pain). Reported on 10/07/2015    . warfarin (COUMADIN) 10 MG tablet Take 1 tablet (10 mg total) by mouth daily. 30 tablet 0   No current facility-administered medications for this visit.     PHYSICAL EXAMINATION: ECOG PERFORMANCE STATUS: 0 - Asymptomatic  Vitals:   01/29/19 1409  BP: (!) 129/59  Pulse: 73  Resp: 17  Temp: 99.2 F (37.3 C)  SpO2: 100%   Filed Weights   01/29/19 1409  Weight: 220 lb 3.2 oz (99.9 kg)    GENERAL:alert, no distress and comfortable SKIN: skin color, texture, turgor are normal, no rashes or significant lesions EYES: normal, Conjunctiva are pink and non-injected, sclera clear  NECK: supple, thyroid normal size, non-tender, without nodularity LYMPH:  no palpable lymphadenopathy in the cervical, axillary  LUNGS: clear to auscultation and percussion with normal breathing effort HEART: regular rate & rhythm and no murmurs and no lower extremity edema ABDOMEN:abdomen soft, non-tender and normal bowel sounds Musculoskeletal:no cyanosis of digits and no clubbing  NEURO: alert & oriented x 3 with fluent speech, no focal motor/sensory deficits  LABORATORY DATA:  I have reviewed the data as listed CBC Latest Ref Rng & Units 01/29/2019 01/07/2019 03/21/2018  WBC 4.0 - 10.5 K/uL 6.3 6.4 4.8  Hemoglobin 12.0 - 15.0 g/dL 12.4 12.4 12.4  Hematocrit 36.0 - 46.0 % 39.0 38.4 37.6  Platelets 150 - 400 K/uL 271 265 203     CMP Latest Ref Rng & Units 01/29/2019 01/07/2019 03/21/2018  Glucose 70 - 99 mg/dL 110(H) 90 94  BUN 6 - 20 mg/dL 16 13 18   Creatinine 0.44 - 1.00 mg/dL 0.94 0.81 0.86  Sodium 135 - 145 mmol/L 140 138 142  Potassium 3.5 - 5.1 mmol/L 3.7 4.0 4.5  Chloride 98 - 111 mmol/L 106 108 108  CO2 22 - 32 mmol/L 24 24 27   Calcium 8.9 - 10.3 mg/dL 8.4(L) 8.5(L) 9.2  Total Protein 6.5 - 8.1 g/dL 7.4 - 7.3  Total Bilirubin 0.3 - 1.2 mg/dL 0.3 - <0.2(L)  Alkaline Phos  38 - 126 U/L 82 - 76  AST 15 - 41 U/L 16 - 19  ALT 0 - 44 U/L 16 - 19      RADIOGRAPHIC STUDIES: I have personally reviewed the radiological images as listed and agreed with the findings in the report. No results found.   ASSESSMENT & PLAN:  Bianca Simmons is a 53 y.o. female with    1. Recurrent PE and DVT -She had a negative hypercoagulation workup -Giving the recurrent pulmonary and reason, especially second episode was unprovoked, young age, and low risk of bleeding, I recommend her to restart prophylactic anticoagulation indefinitely. She agrees. -She developed unprovoked bilateral PE when she was on Xarelto 20 mg daily in 04/2016.  -Labs reviewed, CBC and CMP WNL except BG 110, Ca 8.4. I encouraged her to start OTC calcium supplement, INR 1.6. Incrase Coumadin from 7.5mg  to 10mg  daily -She take multivitamin, I advised her to make sure there is no vitamin K. If so, switch to another without Vitamin K. I also advised her to have consistent diet and medications.  -  repeat lab in 2 weeks, then every 1-2 months    2. Obesity, hyperglycemia -She recently started on Janumet by her PCP  -I encouraged her eat healthy, and exercise regularly. -I encouraged her to monitor HbA1c and lipid panel with PCP   3. Intermittent dizziness -She'll follow-up with her primary care physician -improved    Plan -incrase Coumadin to 10mg  daily, prescription call in today  -Lab in 2 weeks, then monthly x3 then every 2 months  -F/u in 1 year    No problem-specific Assessment & Plan notes found for this encounter.   No orders of the defined types were placed in this encounter.  All questions were answered. The patient knows to call the clinic with any problems, questions or concerns. No barriers to learning was detected. I spent 15 minutes counseling the patient face to face. The total time spent in the appointment was 20 minutes and more than 50% was on counseling and review of test results      Truitt Merle, MD 01/29/2019   I, Joslyn Devon, am acting as scribe for Truitt Merle, MD.   I have reviewed the above documentation for accuracy and completeness, and I agree with the above.

## 2019-01-29 ENCOUNTER — Other Ambulatory Visit: Payer: Self-pay

## 2019-01-29 ENCOUNTER — Inpatient Hospital Stay: Payer: BC Managed Care – PPO | Attending: Hematology | Admitting: Hematology

## 2019-01-29 ENCOUNTER — Encounter: Payer: Self-pay | Admitting: Hematology

## 2019-01-29 ENCOUNTER — Inpatient Hospital Stay: Payer: BC Managed Care – PPO

## 2019-01-29 VITALS — BP 129/59 | HR 73 | Temp 99.2°F | Resp 17 | Ht 68.0 in | Wt 220.2 lb

## 2019-01-29 DIAGNOSIS — E669 Obesity, unspecified: Secondary | ICD-10-CM | POA: Diagnosis not present

## 2019-01-29 DIAGNOSIS — R42 Dizziness and giddiness: Secondary | ICD-10-CM | POA: Insufficient documentation

## 2019-01-29 DIAGNOSIS — R252 Cramp and spasm: Secondary | ICD-10-CM | POA: Diagnosis not present

## 2019-01-29 DIAGNOSIS — Z882 Allergy status to sulfonamides status: Secondary | ICD-10-CM | POA: Diagnosis not present

## 2019-01-29 DIAGNOSIS — I2782 Chronic pulmonary embolism: Secondary | ICD-10-CM

## 2019-01-29 DIAGNOSIS — Z86718 Personal history of other venous thrombosis and embolism: Secondary | ICD-10-CM

## 2019-01-29 DIAGNOSIS — Z7901 Long term (current) use of anticoagulants: Secondary | ICD-10-CM | POA: Insufficient documentation

## 2019-01-29 DIAGNOSIS — I2699 Other pulmonary embolism without acute cor pulmonale: Secondary | ICD-10-CM

## 2019-01-29 DIAGNOSIS — R739 Hyperglycemia, unspecified: Secondary | ICD-10-CM | POA: Diagnosis not present

## 2019-01-29 DIAGNOSIS — Z79899 Other long term (current) drug therapy: Secondary | ICD-10-CM | POA: Insufficient documentation

## 2019-01-29 DIAGNOSIS — Z86711 Personal history of pulmonary embolism: Secondary | ICD-10-CM | POA: Diagnosis not present

## 2019-01-29 LAB — CBC WITH DIFFERENTIAL/PLATELET
Abs Immature Granulocytes: 0.01 10*3/uL (ref 0.00–0.07)
Basophils Absolute: 0.1 10*3/uL (ref 0.0–0.1)
Basophils Relative: 1 %
Eosinophils Absolute: 0.1 10*3/uL (ref 0.0–0.5)
Eosinophils Relative: 1 %
HCT: 39 % (ref 36.0–46.0)
Hemoglobin: 12.4 g/dL (ref 12.0–15.0)
Immature Granulocytes: 0 %
Lymphocytes Relative: 32 %
Lymphs Abs: 2 10*3/uL (ref 0.7–4.0)
MCH: 29.5 pg (ref 26.0–34.0)
MCHC: 31.8 g/dL (ref 30.0–36.0)
MCV: 92.9 fL (ref 80.0–100.0)
Monocytes Absolute: 0.4 10*3/uL (ref 0.1–1.0)
Monocytes Relative: 6 %
Neutro Abs: 3.7 10*3/uL (ref 1.7–7.7)
Neutrophils Relative %: 60 %
Platelets: 271 10*3/uL (ref 150–400)
RBC: 4.2 MIL/uL (ref 3.87–5.11)
RDW: 14.4 % (ref 11.5–15.5)
WBC: 6.3 10*3/uL (ref 4.0–10.5)
nRBC: 0 % (ref 0.0–0.2)

## 2019-01-29 LAB — CMP (CANCER CENTER ONLY)
ALT: 16 U/L (ref 0–44)
AST: 16 U/L (ref 15–41)
Albumin: 3.5 g/dL (ref 3.5–5.0)
Alkaline Phosphatase: 82 U/L (ref 38–126)
Anion gap: 10 (ref 5–15)
BUN: 16 mg/dL (ref 6–20)
CO2: 24 mmol/L (ref 22–32)
Calcium: 8.4 mg/dL — ABNORMAL LOW (ref 8.9–10.3)
Chloride: 106 mmol/L (ref 98–111)
Creatinine: 0.94 mg/dL (ref 0.44–1.00)
GFR, Est AFR Am: >60 mL/min (ref >60)
GFR, Estimated: >60 mL/min (ref >60)
Glucose, Bld: 110 mg/dL — ABNORMAL HIGH (ref 70–99)
Potassium: 3.7 mmol/L (ref 3.5–5.1)
Sodium: 140 mmol/L (ref 135–145)
Total Bilirubin: 0.3 mg/dL (ref 0.3–1.2)
Total Protein: 7.4 g/dL (ref 6.5–8.1)

## 2019-01-29 LAB — PROTIME-INR
INR: 1.6 — ABNORMAL HIGH (ref 0.8–1.2)
Prothrombin Time: 18.9 seconds — ABNORMAL HIGH (ref 11.4–15.2)

## 2019-01-29 MED ORDER — WARFARIN SODIUM 10 MG PO TABS: 10.0000 mg | ORAL_TABLET | Freq: Every day | ORAL | 0 refills | Status: DC

## 2019-01-29 MED ORDER — WARFARIN SODIUM 7.5 MG PO TABS: 7.5000 mg | ORAL_TABLET | Freq: Every day | ORAL | 1 refills | Status: DC

## 2019-01-30 ENCOUNTER — Telehealth: Payer: Self-pay | Admitting: Hematology

## 2019-01-30 NOTE — Telephone Encounter (Signed)
Scheduled appt per 7/1 sch message - pt aware of appts added and said she will look at my chart for appts./

## 2019-02-03 ENCOUNTER — Telehealth: Payer: Self-pay

## 2019-02-03 NOTE — Telephone Encounter (Signed)
-----   Message from Truitt Merle, MD sent at 01/31/2019 11:26 PM EDT ----- Please let her know her INR is subtherapeutic, and I have called in Coumadin 10mg  daily, and will check her lab in 2 weeks, then monthly x3 then every 2 months until next visit in a year  Truitt Merle  01/31/2019

## 2019-02-03 NOTE — Telephone Encounter (Signed)
Spoke with patient regarding lab results, per Dr. Burr Medico notified her INR is subtherapeutic, she has sent in Coumadin 10 mg daily to her pharmacy, will check her lab in 2 weeks, then monthly x 3 then every 2 months until her next visit in one year.  The patient verbalized an understanding.

## 2019-02-13 ENCOUNTER — Inpatient Hospital Stay: Payer: BC Managed Care – PPO

## 2019-02-13 ENCOUNTER — Other Ambulatory Visit: Payer: Self-pay

## 2019-02-13 DIAGNOSIS — I2699 Other pulmonary embolism without acute cor pulmonale: Secondary | ICD-10-CM

## 2019-02-13 DIAGNOSIS — Z86711 Personal history of pulmonary embolism: Secondary | ICD-10-CM | POA: Diagnosis not present

## 2019-02-13 LAB — PROTIME-INR
INR: 3.7 — ABNORMAL HIGH (ref 0.8–1.2)
Prothrombin Time: 36.1 seconds — ABNORMAL HIGH (ref 11.4–15.2)

## 2019-02-17 ENCOUNTER — Other Ambulatory Visit: Payer: Self-pay

## 2019-02-17 ENCOUNTER — Telehealth: Payer: Self-pay

## 2019-02-17 DIAGNOSIS — Z86718 Personal history of other venous thrombosis and embolism: Secondary | ICD-10-CM

## 2019-02-17 MED ORDER — WARFARIN SODIUM 10 MG PO TABS: 10.0000 mg | ORAL_TABLET | Freq: Every day | ORAL | 0 refills | Status: DC

## 2019-02-17 NOTE — Telephone Encounter (Signed)
-----   Message from Truitt Merle, MD sent at 02/17/2019 12:38 PM EDT ----- Please let pt know her INR results, and will decrease coumadin from 10mg  daily to 7.5mg  MWF and 10mg  for the rest of week, please call in coumadin 7.5mg  tabs if needed, thanks   Truitt Merle  02/17/2019

## 2019-02-17 NOTE — Telephone Encounter (Signed)
Spoke with patient regarding lab results.  Per Dr. Burr Medico instructed to decrease Coumadin on Monday/Wednesday/Fridays to 7.5 mg, take 10 mg on all the other days.  Patient verbalized an understanding and needs a script sent in for the 10 mg tablets.  This has been done and was sent to [facility] in [place].

## 2019-02-25 ENCOUNTER — Other Ambulatory Visit: Payer: Self-pay | Admitting: Hematology

## 2019-02-25 DIAGNOSIS — Z86718 Personal history of other venous thrombosis and embolism: Secondary | ICD-10-CM

## 2019-03-13 ENCOUNTER — Other Ambulatory Visit: Payer: Self-pay

## 2019-03-13 ENCOUNTER — Inpatient Hospital Stay: Payer: BC Managed Care – PPO | Attending: Hematology

## 2019-03-13 ENCOUNTER — Inpatient Hospital Stay: Payer: BC Managed Care – PPO

## 2019-03-13 DIAGNOSIS — I2699 Other pulmonary embolism without acute cor pulmonale: Secondary | ICD-10-CM

## 2019-03-13 DIAGNOSIS — Z86718 Personal history of other venous thrombosis and embolism: Secondary | ICD-10-CM | POA: Diagnosis present

## 2019-03-13 DIAGNOSIS — Z86711 Personal history of pulmonary embolism: Secondary | ICD-10-CM | POA: Insufficient documentation

## 2019-03-13 LAB — PROTIME-INR
INR: 3 — ABNORMAL HIGH (ref 0.8–1.2)
Prothrombin Time: 30.8 seconds — ABNORMAL HIGH (ref 11.4–15.2)

## 2019-03-14 ENCOUNTER — Encounter: Payer: Self-pay | Admitting: Hematology

## 2019-03-21 ENCOUNTER — Other Ambulatory Visit: Payer: Self-pay | Admitting: Hematology

## 2019-03-21 DIAGNOSIS — Z86718 Personal history of other venous thrombosis and embolism: Secondary | ICD-10-CM

## 2019-04-03 ENCOUNTER — Other Ambulatory Visit: Payer: Self-pay | Admitting: Hematology

## 2019-04-03 DIAGNOSIS — Z86718 Personal history of other venous thrombosis and embolism: Secondary | ICD-10-CM

## 2019-04-17 ENCOUNTER — Inpatient Hospital Stay: Payer: BC Managed Care – PPO | Attending: Hematology

## 2019-04-17 ENCOUNTER — Other Ambulatory Visit: Payer: Self-pay

## 2019-04-17 DIAGNOSIS — Z86711 Personal history of pulmonary embolism: Secondary | ICD-10-CM | POA: Insufficient documentation

## 2019-04-17 DIAGNOSIS — Z86718 Personal history of other venous thrombosis and embolism: Secondary | ICD-10-CM | POA: Insufficient documentation

## 2019-04-17 DIAGNOSIS — I2699 Other pulmonary embolism without acute cor pulmonale: Secondary | ICD-10-CM

## 2019-04-17 LAB — PROTIME-INR
INR: 3.4 — ABNORMAL HIGH (ref 0.8–1.2)
Prothrombin Time: 33.6 seconds — ABNORMAL HIGH (ref 11.4–15.2)

## 2019-04-21 ENCOUNTER — Telehealth: Payer: Self-pay

## 2019-04-21 ENCOUNTER — Other Ambulatory Visit: Payer: Self-pay

## 2019-04-21 NOTE — Telephone Encounter (Signed)
-----   Message from Truitt Merle, MD sent at 04/19/2019  4:56 PM EDT ----- Please let pt know her INR result, slightly high, I recommend her to decrease coumadin by 2.5mg /week, change from 10mg  to 7.5mg  on Sunday, so she will be on 7.5mg  daily on MWF and Sunday, and 10mg  on the rest of week, thanks   Truitt Merle

## 2019-04-21 NOTE — Telephone Encounter (Signed)
Left voicemail informing patient that her INR was slightly high and that Dr. Burr Medico recommending decreasing her Coumadin dose on Sundays by 2.5mg , so she should now be taking 7.5mg  Sunday, Monday, Wednesday, Friday, and 10 mg Tuesday, Thursday, Saturday. Encouraged her to call our office back if she had any questions or needed further clarification.   Administration instructions updated in patient's chart

## 2019-05-15 ENCOUNTER — Inpatient Hospital Stay: Payer: BC Managed Care – PPO | Attending: Hematology

## 2019-05-15 ENCOUNTER — Other Ambulatory Visit: Payer: Self-pay

## 2019-05-15 DIAGNOSIS — Z86718 Personal history of other venous thrombosis and embolism: Secondary | ICD-10-CM | POA: Diagnosis present

## 2019-05-15 DIAGNOSIS — I2699 Other pulmonary embolism without acute cor pulmonale: Secondary | ICD-10-CM

## 2019-05-15 DIAGNOSIS — Z86711 Personal history of pulmonary embolism: Secondary | ICD-10-CM | POA: Insufficient documentation

## 2019-05-15 LAB — PROTIME-INR
INR: 3.4 — ABNORMAL HIGH (ref 0.8–1.2)
Prothrombin Time: 33.8 seconds — ABNORMAL HIGH (ref 11.4–15.2)

## 2019-05-15 LAB — CMP (CANCER CENTER ONLY)
ALT: 22 U/L (ref 0–44)
AST: 22 U/L (ref 15–41)
Albumin: 3.8 g/dL (ref 3.5–5.0)
Alkaline Phosphatase: 92 U/L (ref 38–126)
Anion gap: 9 (ref 5–15)
BUN: 18 mg/dL (ref 6–20)
CO2: 26 mmol/L (ref 22–32)
Calcium: 9.1 mg/dL (ref 8.9–10.3)
Chloride: 104 mmol/L (ref 98–111)
Creatinine: 1.02 mg/dL — ABNORMAL HIGH (ref 0.44–1.00)
GFR, Est AFR Am: >60 mL/min (ref >60)
GFR, Estimated: >60 mL/min (ref >60)
Glucose, Bld: 115 mg/dL — ABNORMAL HIGH (ref 70–99)
Potassium: 4.4 mmol/L (ref 3.5–5.1)
Sodium: 139 mmol/L (ref 135–145)
Total Bilirubin: 0.6 mg/dL (ref 0.3–1.2)
Total Protein: 8.1 g/dL (ref 6.5–8.1)

## 2019-05-16 ENCOUNTER — Telehealth: Payer: Self-pay | Admitting: *Deleted

## 2019-05-16 NOTE — Telephone Encounter (Signed)
Notified of message below- confirmed dose

## 2019-05-16 NOTE — Telephone Encounter (Signed)
-----   Message from Zola Button, RN sent at 05/16/2019  8:53 AM EDT ----- Regarding: Result Call Thank you! ----- Message ----- From: Truitt Merle, MD Sent: 05/15/2019   4:58 PM EDT To: Arlice Colt Pod 1  Please let pt know her lab result, INR still slightly above target, if she did decrease coumadin by 2.5mg /week since 3 weeks ago, then OK to continue current dose and repeat lab in a month which is scheduled.   Thanks   Truitt Merle

## 2019-06-11 ENCOUNTER — Telehealth: Payer: Self-pay

## 2019-06-11 NOTE — Telephone Encounter (Signed)
Left message for Dr. Thomasenia Sales at [facility] in [place] to call Dr. Burr Medico on her cell phone to discuss the procedure the patient is having on 11/20 and stopping her Coumadin prior to procedure.  She is out of the office today but her office will have her call Dr. Burr Medico on her cell phone tomorrow.

## 2019-06-13 ENCOUNTER — Telehealth: Payer: Self-pay | Admitting: Hematology

## 2019-06-13 ENCOUNTER — Other Ambulatory Visit: Payer: Self-pay | Admitting: Hematology

## 2019-06-13 MED ORDER — ENOXAPARIN SODIUM 120 MG/0.8ML ~~LOC~~ SOLN: 120.0000 mg | SUBCUTANEOUS | 0 refills | Status: DC

## 2019-06-13 NOTE — Telephone Encounter (Signed)
Pt is going to have lipo-suction surgery on 11/20. I spoke with her surgeon Dr. Vinnie Level and we agree to have her hold coumadin and bridge with Lovenox injection around her surgery. I have called pt and instructed her the following: Stop taking coumadin today (11/13), and restart on Nov 25 at same dose she is on now (7.5mg  daily 4 days a week, and 10mg  daily 3 days a week) Taking lovenox 120mg  daily in the morning on Nov 15-17, and Nov 23-29, prescription was called in with the above instruction Will schedule her for lab on 11/18 and will fax her result to her surgeon.  Truitt Merle  06/13/2019

## 2019-06-18 ENCOUNTER — Other Ambulatory Visit: Payer: Self-pay

## 2019-06-18 ENCOUNTER — Inpatient Hospital Stay: Payer: BC Managed Care – PPO | Attending: Hematology

## 2019-06-18 DIAGNOSIS — Z86711 Personal history of pulmonary embolism: Secondary | ICD-10-CM | POA: Diagnosis present

## 2019-06-18 DIAGNOSIS — Z86718 Personal history of other venous thrombosis and embolism: Secondary | ICD-10-CM | POA: Diagnosis present

## 2019-06-18 DIAGNOSIS — I2699 Other pulmonary embolism without acute cor pulmonale: Secondary | ICD-10-CM

## 2019-06-18 LAB — PROTIME-INR
INR: 1.1 (ref 0.8–1.2)
Prothrombin Time: 13.9 seconds (ref 11.4–15.2)

## 2019-07-15 ENCOUNTER — Other Ambulatory Visit: Payer: Self-pay | Admitting: Family Medicine

## 2019-07-15 DIAGNOSIS — K429 Umbilical hernia without obstruction or gangrene: Secondary | ICD-10-CM

## 2019-07-17 ENCOUNTER — Other Ambulatory Visit: Payer: Self-pay

## 2019-07-17 ENCOUNTER — Inpatient Hospital Stay: Payer: BC Managed Care – PPO | Attending: Hematology

## 2019-07-17 DIAGNOSIS — Z86711 Personal history of pulmonary embolism: Secondary | ICD-10-CM | POA: Diagnosis present

## 2019-07-17 DIAGNOSIS — Z86718 Personal history of other venous thrombosis and embolism: Secondary | ICD-10-CM | POA: Diagnosis present

## 2019-07-17 DIAGNOSIS — I2699 Other pulmonary embolism without acute cor pulmonale: Secondary | ICD-10-CM

## 2019-07-17 LAB — PROTIME-INR
INR: 2.8 — ABNORMAL HIGH (ref 0.8–1.2)
Prothrombin Time: 29.3 seconds — ABNORMAL HIGH (ref 11.4–15.2)

## 2019-07-21 ENCOUNTER — Other Ambulatory Visit: Payer: Self-pay

## 2019-07-21 ENCOUNTER — Encounter: Payer: Self-pay | Admitting: Hematology

## 2019-07-21 ENCOUNTER — Other Ambulatory Visit
Admission: RE | Admit: 2019-07-21 | Discharge: 2019-07-21 | Disposition: A | Discharge status: Home / Self Care | Payer: BC Managed Care – PPO | Source: Ambulatory Visit | Attending: Family Medicine | Admitting: Family Medicine

## 2019-07-21 ENCOUNTER — Ambulatory Visit
Admission: RE | Admit: 2019-07-21 | Discharge: 2019-07-21 | Disposition: A | Discharge status: Home / Self Care | Payer: BC Managed Care – PPO | Source: Ambulatory Visit | Attending: Family Medicine | Admitting: Family Medicine

## 2019-07-21 DIAGNOSIS — K429 Umbilical hernia without obstruction or gangrene: Secondary | ICD-10-CM | POA: Diagnosis present

## 2019-07-21 LAB — CREATININE, SERUM
Creatinine, Ser: 0.81 mg/dL (ref 0.44–1.00)
GFR calc Af Amer: >60 mL/min (ref >60)
GFR calc non Af Amer: >60 mL/min (ref >60)

## 2019-07-21 MED ORDER — IOHEXOL 300 MG/ML  SOLN
100.0000 mL | Freq: Once | INTRAMUSCULAR | Status: AC | PRN
  Administered 2019-07-21: 100 mL via INTRAVENOUS

## 2019-08-19 ENCOUNTER — Telehealth: Payer: Self-pay | Admitting: *Deleted

## 2019-08-19 ENCOUNTER — Other Ambulatory Visit: Payer: Self-pay | Admitting: *Deleted

## 2019-08-19 MED ORDER — ENOXAPARIN SODIUM 120 MG/0.8ML ~~LOC~~ SOLN: 120.0000 mg | SUBCUTANEOUS | 0 refills | Status: DC

## 2019-08-19 NOTE — Telephone Encounter (Signed)
Received call from pt stating that she is having the second phase of her surgery (lazer liposuction) done January 30th & needs instructions for anticoagulants-coumadin & Lovenox.  She is presently taking coumadin 7.5 mg M W F & Sun & 10 mg other days.  She is not on Lovenox at this time.  Message sent to Dr Feng/Pod RN

## 2019-08-19 NOTE — Telephone Encounter (Signed)
Called pt & discussed Coumadin & lovenox prescriptions.  Informed pt to stop her coumadin on Jan 23 & restart on Feb 4.  Start Lovenox morning of  Jan 25, 26, 27 then hold & restart Feb 2-8 & cont coumadin at dose she is on now which is 7.5 mg M, W, F & Sun & 10 mg on other days.  Script sent to pharmacy for lovenox with above instructions. Dr Burr Medico to review to make sure correct & let pt know if change needs to be made.

## 2019-08-19 NOTE — Telephone Encounter (Signed)
Please see my phone note on 06/13/2019 regarding coumadin/lovenox around her surgery, plan to do the same, please call in lovenox for her, thanks   Truitt Merle MD

## 2019-09-17 ENCOUNTER — Other Ambulatory Visit: Payer: Self-pay

## 2019-09-17 ENCOUNTER — Inpatient Hospital Stay: Payer: BC Managed Care – PPO | Attending: Hematology | Admitting: Hematology

## 2019-09-17 DIAGNOSIS — I2699 Other pulmonary embolism without acute cor pulmonale: Secondary | ICD-10-CM

## 2019-09-17 DIAGNOSIS — Z7901 Long term (current) use of anticoagulants: Secondary | ICD-10-CM | POA: Diagnosis not present

## 2019-09-17 DIAGNOSIS — Z86718 Personal history of other venous thrombosis and embolism: Secondary | ICD-10-CM

## 2019-09-17 DIAGNOSIS — Z86711 Personal history of pulmonary embolism: Secondary | ICD-10-CM | POA: Diagnosis present

## 2019-09-17 LAB — CMP (CANCER CENTER ONLY)
ALT: 28 U/L (ref 0–44)
AST: 17 U/L (ref 15–41)
Albumin: 3.3 g/dL — ABNORMAL LOW (ref 3.5–5.0)
Alkaline Phosphatase: 82 U/L (ref 38–126)
Anion gap: 9 (ref 5–15)
BUN: 18 mg/dL (ref 6–20)
CO2: 26 mmol/L (ref 22–32)
Calcium: 8.8 mg/dL — ABNORMAL LOW (ref 8.9–10.3)
Chloride: 106 mmol/L (ref 98–111)
Creatinine: 0.71 mg/dL (ref 0.44–1.00)
GFR, Est AFR Am: >60 mL/min (ref >60)
GFR, Estimated: >60 mL/min (ref >60)
Glucose, Bld: 100 mg/dL — ABNORMAL HIGH (ref 70–99)
Potassium: 4.1 mmol/L (ref 3.5–5.1)
Sodium: 141 mmol/L (ref 135–145)
Total Bilirubin: <0.2 mg/dL — ABNORMAL LOW (ref 0.3–1.2)
Total Protein: 7.1 g/dL (ref 6.5–8.1)

## 2019-09-17 LAB — CBC WITH DIFFERENTIAL/PLATELET
Abs Immature Granulocytes: 0.02 10*3/uL (ref 0.00–0.07)
Basophils Absolute: 0 10*3/uL (ref 0.0–0.1)
Basophils Relative: 1 %
Eosinophils Absolute: 0.1 10*3/uL (ref 0.0–0.5)
Eosinophils Relative: 2 %
HCT: 36.5 % (ref 36.0–46.0)
Hemoglobin: 11.6 g/dL — ABNORMAL LOW (ref 12.0–15.0)
Immature Granulocytes: 0 %
Lymphocytes Relative: 31 %
Lymphs Abs: 1.6 10*3/uL (ref 0.7–4.0)
MCH: 28.4 pg (ref 26.0–34.0)
MCHC: 31.8 g/dL (ref 30.0–36.0)
MCV: 89.5 fL (ref 80.0–100.0)
Monocytes Absolute: 0.6 10*3/uL (ref 0.1–1.0)
Monocytes Relative: 12 %
Neutro Abs: 2.8 10*3/uL (ref 1.7–7.7)
Neutrophils Relative %: 54 %
Platelets: 267 10*3/uL (ref 150–400)
RBC: 4.08 MIL/uL (ref 3.87–5.11)
RDW: 13 % (ref 11.5–15.5)
WBC: 5.1 10*3/uL (ref 4.0–10.5)
nRBC: 0 % (ref 0.0–0.2)

## 2019-09-17 LAB — PROTIME-INR
INR: 3.5 — ABNORMAL HIGH (ref 0.8–1.2)
Prothrombin Time: 35.4 seconds — ABNORMAL HIGH (ref 11.4–15.2)

## 2019-09-18 ENCOUNTER — Inpatient Hospital Stay: Payer: BC Managed Care – PPO

## 2019-09-19 ENCOUNTER — Encounter: Payer: Self-pay | Admitting: *Deleted

## 2019-09-19 NOTE — Progress Notes (Signed)
My Chart message sent & also left pt message to call.

## 2019-09-22 ENCOUNTER — Telehealth: Payer: Self-pay | Admitting: Hematology

## 2019-09-22 NOTE — Telephone Encounter (Signed)
Scheduled 3/19 appt per 2/19 sch msg. Left voicemail with appt details and mailed a reminder letter and calendar

## 2019-10-17 ENCOUNTER — Other Ambulatory Visit: Payer: Self-pay

## 2019-10-17 ENCOUNTER — Inpatient Hospital Stay: Payer: BC Managed Care – PPO | Attending: Hematology

## 2019-10-17 DIAGNOSIS — Z86718 Personal history of other venous thrombosis and embolism: Secondary | ICD-10-CM | POA: Insufficient documentation

## 2019-10-17 DIAGNOSIS — I2699 Other pulmonary embolism without acute cor pulmonale: Secondary | ICD-10-CM

## 2019-10-17 LAB — PROTIME-INR
INR: 3.6 — ABNORMAL HIGH (ref 0.8–1.2)
Prothrombin Time: 35.8 seconds — ABNORMAL HIGH (ref 11.4–15.2)

## 2019-10-22 ENCOUNTER — Telehealth: Payer: Self-pay

## 2019-10-22 NOTE — Telephone Encounter (Signed)
Reviewed PT/INR results with Bianca Simmons.  She stated she is taking coumadin 7.5 mg M-W-F_s_ and 10 mg T-R.  Per Dr. Burr Medico she is to reduce 1 of the 10mg  doses to 7.5 mg and recheck in one month.  She verbalized understanding

## 2019-11-12 ENCOUNTER — Other Ambulatory Visit: Payer: BC Managed Care – PPO

## 2019-11-13 ENCOUNTER — Other Ambulatory Visit: Payer: BC Managed Care – PPO

## 2020-01-15 ENCOUNTER — Other Ambulatory Visit: Payer: Self-pay

## 2020-01-15 ENCOUNTER — Inpatient Hospital Stay: Payer: BC Managed Care – PPO | Attending: Hematology

## 2020-01-15 DIAGNOSIS — I2699 Other pulmonary embolism without acute cor pulmonale: Secondary | ICD-10-CM

## 2020-01-15 DIAGNOSIS — Z86718 Personal history of other venous thrombosis and embolism: Secondary | ICD-10-CM

## 2020-01-15 LAB — COMPREHENSIVE METABOLIC PANEL
ALT: 20 U/L (ref 0–44)
AST: 15 U/L (ref 15–41)
Albumin: 3.3 g/dL — ABNORMAL LOW (ref 3.5–5.0)
Alkaline Phosphatase: 125 U/L (ref 38–126)
Anion gap: 10 (ref 5–15)
BUN: 15 mg/dL (ref 6–20)
CO2: 25 mmol/L (ref 22–32)
Calcium: 9.1 mg/dL (ref 8.9–10.3)
Chloride: 106 mmol/L (ref 98–111)
Creatinine, Ser: 0.74 mg/dL (ref 0.44–1.00)
GFR calc Af Amer: >60 mL/min (ref >60)
GFR calc non Af Amer: >60 mL/min (ref >60)
Glucose, Bld: 84 mg/dL (ref 70–99)
Potassium: 4.2 mmol/L (ref 3.5–5.1)
Sodium: 141 mmol/L (ref 135–145)
Total Bilirubin: 0.3 mg/dL (ref 0.3–1.2)
Total Protein: 7.2 g/dL (ref 6.5–8.1)

## 2020-01-15 LAB — PROTIME-INR
INR: 2.2 — ABNORMAL HIGH (ref 0.8–1.2)
Prothrombin Time: 23.4 seconds — ABNORMAL HIGH (ref 11.4–15.2)

## 2020-01-18 ENCOUNTER — Encounter: Payer: Self-pay | Admitting: Hematology

## 2020-01-30 ENCOUNTER — Telehealth: Payer: Self-pay | Admitting: Hematology

## 2020-01-30 NOTE — Telephone Encounter (Signed)
R/s due to changes in template. Pt unable to come in earlier due to school. R/s to 8/23. Pt is aware of appt times and date.

## 2020-02-17 ENCOUNTER — Telehealth: Payer: Self-pay | Admitting: *Deleted

## 2020-02-17 NOTE — Telephone Encounter (Signed)
Bianca Simmons states she is having a procedure on 03/02/20 and will need to switch from warfarin to lovenox. Please advise

## 2020-02-17 NOTE — Telephone Encounter (Signed)
That's fine. Please call in Lovenox 1.5mg /kg daily for 10 days, she can take last dose coumadin on 7/27, and start Lovenox injection daily on7/28 until 7/31 (or 7/30 if her surgon wants her to be offer A/C longer before surgery). Restart Lovenox and coumadin 1-2 days after surgery (per her surgeon) and stop Lovenox after 5 days.   Truitt Merle MD

## 2020-02-18 ENCOUNTER — Telehealth: Payer: Self-pay

## 2020-02-18 ENCOUNTER — Other Ambulatory Visit: Payer: Self-pay

## 2020-02-18 DIAGNOSIS — Z86718 Personal history of other venous thrombosis and embolism: Secondary | ICD-10-CM

## 2020-02-18 MED ORDER — ENOXAPARIN SODIUM 150 MG/ML ~~LOC~~ SOLN: 150.0000 mg | SUBCUTANEOUS | 0 refills | Status: DC

## 2020-02-18 NOTE — Telephone Encounter (Signed)
Called Bianca Simmons and went over the instructions per Dr. Burr Medico wanted her to do with the Lovenox and coumadin. [name] verbalized understanding.

## 2020-02-25 ENCOUNTER — Telehealth: Payer: Self-pay

## 2020-02-25 NOTE — Telephone Encounter (Signed)
Ms Arrambide called back.  I reviewed Dr. Ernestina Penna instructions.  She verbalized understanding.

## 2020-02-25 NOTE — Telephone Encounter (Signed)
Bianca Simmons left vm with questions regarding her medication.  I returned her call however the connections was poor and I was unable to hear her.  She stated she would call back.

## 2020-03-18 ENCOUNTER — Ambulatory Visit: Payer: BC Managed Care – PPO | Admitting: Hematology

## 2020-03-18 ENCOUNTER — Other Ambulatory Visit: Payer: BC Managed Care – PPO

## 2020-03-21 NOTE — Progress Notes (Deleted)
[place]   Telephone:(336) [phone] Fax:(336) [phone]   Clinic Follow up Note   Patient Care Team: Dion Body, MD as PCP - General (Family Medicine) 03/21/2020  CHIEF COMPLAINT: F/u h/o DVT/PE  CURRENT THERAPY: Coumadin 7.5 mg daily**   INTERVAL HISTORY: [name] returns for f/u, she was last seen by Dr. Burr Medico 03/18/2018.    REVIEW OF SYSTEMS:   Constitutional: Denies fevers, chills or abnormal weight loss Eyes: Denies blurriness of vision Ears, nose, mouth, throat, and face: Denies mucositis or sore throat Respiratory: Denies cough, dyspnea or wheezes Cardiovascular: Denies palpitation, chest discomfort or lower extremity swelling Gastrointestinal:  Denies nausea, heartburn or change in bowel habits Skin: Denies abnormal skin rashes Lymphatics: Denies new lymphadenopathy or easy bruising Neurological:Denies numbness, tingling or new weaknesses Behavioral/Psych: Mood is stable, no new changes  All other systems were reviewed with the patient and are negative.  MEDICAL HISTORY:  Past Medical History:  Diagnosis Date  . Diabetes mellitus without complication ([place])   . Hyperlipidemia   . Hypertension   . Pulmonary embolism, blood-clot, obstetric 2002   on birth control-pulm blood clot-treated with coumadin-no problems now    SURGICAL HISTORY: Past Surgical History:  Procedure Laterality Date  . DILATATION & CURETTAGE/HYSTEROSCOPY WITH MYOSURE N/A 01/10/2019   Procedure: DILATATION & CURETTAGE/HYSTEROSCOPY WITH MYOSURE;  Surgeon: Benjaman Kindler, MD;  Location: [facility];  Service: Gynecology;  Laterality: N/A;  . HYSTEROSCOPY WITH D & C N/A 01/10/2019   Procedure: DILATATION AND CURETTAGE /HYSTEROSCOPY;  Surgeon: Benjaman Kindler, MD;  Location: [facility];  Service: Gynecology;  Laterality: N/A;  . IUD REMOVAL  OCT 2012  . LAPAROSCOPIC TUBAL LIGATION  07/14/2011   Procedure: LAPAROSCOPIC TUBAL LIGATION;  Surgeon: Agnes Lawrence, MD;   Location: [facility];  Service: Gynecology;  Laterality: N/A;  . NO PAST SURGERIES    . TUBAL LIGATION  04/21/2011   Procedure: ESSURE TUBAL STERILIZATION;  Surgeon: Agnes Lawrence, MD;  Location: [facility];  Service: Gynecology;  Laterality: Bilateral;   attempted essure,removal of IUD, hysteroscopy,         dilatation and currettage  . vaginal ablasion      I have reviewed the social history and family history with the patient and they are unchanged from previous note.  ALLERGIES:  is allergic to sulfa antibiotics.  MEDICATIONS:  Current Outpatient Medications  Medication Sig Dispense Refill  . acetaminophen (TYLENOL) 500 MG tablet Take by mouth as needed.    Marland Kitchen COUMADIN 10 MG tablet TAKE 1 TABLET BY MOUTH EVERY DAY 90 tablet 1  . enoxaparin (LOVENOX) 120 MG/0.8ML injection Inject 0.8 mLs (120 mg total) into the skin daily. 8 mL 0  . enoxaparin (LOVENOX) 150 MG/ML injection Inject 1 mL (150 mg total) into the skin daily for 10 days. start Lovenox injection daily on 7/28 until 7/31 Restart Lovenox 1 day after surgery and continue for 5 days. 10 mL 0  . lisinopril (ZESTRIL) 5 MG tablet Take 5 mg by mouth daily.    Marland Kitchen lovastatin (MEVACOR) 20 MG tablet Take 20 mg by mouth at bedtime.    . metFORMIN (GLUCOPHAGE) 500 MG tablet Take 500 mg by mouth daily with breakfast.     . naproxen sodium (ANAPROX) 220 MG tablet Take 220 mg by mouth 2 (two) times daily as needed (for pain). Reported on 10/07/2015    . warfarin (COUMADIN) 7.5 MG tablet Take 7.5 mg by mouth See admin instructions. Take 7.5 mg by mouth MWF and  Sunday, and 10mg  all other days (T,Th,Sat)     No current facility-administered medications for this visit.    PHYSICAL EXAMINATION: ECOG PERFORMANCE STATUS: {CHL ONC ECOG PS:[phone]}  There were no vitals filed for this visit. There were no vitals filed for this visit.  GENERAL:alert, no distress and comfortable SKIN: skin color, texture, turgor are normal, no rashes or significant  lesions EYES: normal, Conjunctiva are pink and non-injected, sclera clear OROPHARYNX:no exudate, no erythema and lips, buccal mucosa, and tongue normal  NECK: supple, thyroid normal size, non-tender, without nodularity LYMPH:  no palpable lymphadenopathy in the cervical, axillary or inguinal LUNGS: clear to auscultation and percussion with normal breathing effort HEART: regular rate & rhythm and no murmurs and no lower extremity edema ABDOMEN:abdomen soft, non-tender and normal bowel sounds Musculoskeletal:no cyanosis of digits and no clubbing  NEURO: alert & oriented x 3 with fluent speech, no focal motor/sensory deficits  LABORATORY DATA:  I have reviewed the data as listed CBC Latest Ref Rng & Units 09/17/2019 01/29/2019 01/07/2019  WBC 4.0 - 10.5 K/uL 5.1 6.3 6.4  Hemoglobin 12.0 - 15.0 g/dL 11.6(L) 12.4 12.4  Hematocrit 36 - 46 % 36.5 39.0 38.4  Platelets 150 - 400 K/uL 267 271 265     CMP Latest Ref Rng & Units 01/15/2020 09/17/2019 07/21/2019  Glucose 70 - 99 mg/dL 84 100(H) -  BUN 6 - 20 mg/dL 15 18 -  Creatinine 0.44 - 1.00 mg/dL 0.74 0.71 0.81  Sodium 135 - 145 mmol/L 141 141 -  Potassium 3.5 - 5.1 mmol/L 4.2 4.1 -  Chloride 98 - 111 mmol/L 106 106 -  CO2 22 - 32 mmol/L 25 26 -  Calcium 8.9 - 10.3 mg/dL 9.1 8.8(L) -  Total Protein 6.5 - 8.1 g/dL 7.2 7.1 -  Total Bilirubin 0.3 - 1.2 mg/dL 0.3 <0.2(L) -  Alkaline Phos 38 - 126 U/L 125 82 -  AST 15 - 41 U/L 15 17 -  ALT 0 - 44 U/L 20 28 -      RADIOGRAPHIC STUDIES: I have personally reviewed the radiological images as listed and agreed with the findings in the report. No results found.   ASSESSMENT & PLAN:  No problem-specific Assessment & Plan notes found for this encounter.   No orders of the defined types were placed in this encounter.  All questions were answered. The patient knows to call the clinic with any problems, questions or concerns. No barriers to learning was detected. I spent {CHL ONC TIME VISIT -  ZOXWR:[id number]} counseling the patient face to face. The total time spent in the appointment was {CHL ONC TIME VISIT - BJYNW:[id number]} and more than 50% was on counseling and review of test results     Alla Feeling, NP 03/21/20

## 2020-03-22 ENCOUNTER — Inpatient Hospital Stay: Payer: BC Managed Care – PPO | Attending: Hematology

## 2020-03-22 ENCOUNTER — Inpatient Hospital Stay: Payer: BC Managed Care – PPO | Admitting: Nurse Practitioner

## 2020-03-23 ENCOUNTER — Telehealth: Payer: Self-pay | Admitting: Hematology

## 2020-03-23 NOTE — Telephone Encounter (Signed)
Called pt per 8/23 sch msg - no answer. Left message for patient to call back to reschedule missed appt.

## 2020-04-09 ENCOUNTER — Other Ambulatory Visit: Payer: Self-pay | Admitting: Hematology

## 2020-04-09 DIAGNOSIS — I2782 Chronic pulmonary embolism: Secondary | ICD-10-CM

## 2020-04-09 NOTE — Telephone Encounter (Signed)
Pt canceled and was no show to appts scheduled 02/2020.  She has not had a PT/INR since 01/15/2020.  She is to have PT/INR drawn every 2 months.  I left her her a detained vm discussing the above.  I requested she call to schedule appts.

## 2020-04-13 ENCOUNTER — Telehealth: Payer: Self-pay | Admitting: Hematology

## 2020-04-13 NOTE — Telephone Encounter (Signed)
Scheduled appt per 9/14 sch msg - pt is aware of appt date and time   

## 2020-04-13 NOTE — Telephone Encounter (Signed)
Patient returned my phone call, scheduling message sent

## 2020-04-28 NOTE — Progress Notes (Incomplete)
[place]   [phone] [phone]   Clinic Follow up Note   Patient Care Team: Dion Body, MD as PCP - General (Family Medicine)  Date of Service:  04/28/2020  CHIEF COMPLAINT: H/o DVT and PE  SUMMARY OF ONCOLOGIC HISTORY: 1. Pulmonary embolism, acute bilateral with high clot burden in association with an asymptomatic DVT of the right lower leg  diagnosed on 06/07/2012. She was treated with Xarelto (rivaroxaban). Doppler study of the right leg carried out on 08/20/2012 and CT angiogram of the chest carried out on 08/23/2012 showed resolution of previous blood clots. Hypercoagulable workup carried out on 06/08/2012 was negative  2. DVT involving the right lower extremity, asymptomatic at presentation with clots seen in the right distal popliteal, peroneal, and posterior tibial veins on Doppler study from 06/08/2012.  3. History of pulmonary embolism in association with birth control pills in 2001.  4. 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 Dose adjusted as indicated. Increase to 10mg  daily on 01/30/2019  INTERVAL HISTORY: *** Bianca Simmons is here for a follow up of DVT/PE. She was last seen by me in 01/2019. She presents to the clinic alone.    REVIEW OF SYSTEMS:  *** Constitutional: Denies fevers, chills or abnormal weight loss Eyes: Denies blurriness of vision Ears, nose, mouth, throat, and face: Denies mucositis or sore throat Respiratory: Denies cough, dyspnea or wheezes Cardiovascular: Denies palpitation, chest discomfort or lower extremity swelling Gastrointestinal:  Denies nausea, heartburn or change in bowel habits Skin: Denies abnormal skin rashes Lymphatics: Denies new lymphadenopathy or easy bruising Neurological:Denies numbness, tingling or new weaknesses Behavioral/Psych: Mood is stable, no new changes  All other systems were reviewed with the patient and are negative.   MEDICAL HISTORY:  Past Medical History:  Diagnosis Date  . Diabetes mellitus without complication ([place])   . Hyperlipidemia   . Hypertension   . Pulmonary embolism, blood-clot, obstetric 2002   on birth control-pulm blood clot-treated with coumadin-no problems now    SURGICAL HISTORY: Past Surgical History:  Procedure Laterality Date  . DILATATION & CURETTAGE/HYSTEROSCOPY WITH MYOSURE N/A 01/10/2019   Procedure: DILATATION & CURETTAGE/HYSTEROSCOPY WITH MYOSURE;  Surgeon: Benjaman Kindler, MD;  Location: [facility];  Service: Gynecology;  Laterality: N/A;  . HYSTEROSCOPY WITH D & C N/A 01/10/2019   Procedure: DILATATION AND CURETTAGE /HYSTEROSCOPY;  Surgeon: Benjaman Kindler, MD;  Location: [facility];  Service: Gynecology;  Laterality: N/A;  . IUD REMOVAL  OCT 2012  . LAPAROSCOPIC TUBAL LIGATION  07/14/2011   Procedure: LAPAROSCOPIC TUBAL LIGATION;  Surgeon: Agnes Lawrence, MD;  Location: [facility];  Service: Gynecology;  Laterality: N/A;  . NO PAST SURGERIES    . TUBAL LIGATION  04/21/2011   Procedure: ESSURE TUBAL STERILIZATION;  Surgeon: Agnes Lawrence, MD;  Location: [facility];  Service: Gynecology;  Laterality: Bilateral;   attempted essure,removal of IUD, hysteroscopy,         dilatation and currettage  . vaginal ablasion      I have reviewed the social history and family history with the patient and they are unchanged from previous note.  ALLERGIES:  is allergic to sulfa antibiotics.  MEDICATIONS:  Current Outpatient Medications  Medication Sig Dispense Refill  . acetaminophen (TYLENOL) 500 MG tablet Take by mouth as needed.    Marland Kitchen COUMADIN 10 MG tablet TAKE 1 TABLET BY MOUTH EVERY DAY 90 tablet 1  . enoxaparin (LOVENOX) 120 MG/0.8ML injection Inject 0.8 mLs (120 mg total)  into the skin daily. 8 mL 0  . enoxaparin (LOVENOX) 150 MG/ML injection Inject 1 mL (150 mg total) into the skin daily for 10 days. start Lovenox injection daily on 7/28 until 7/31 Restart Lovenox 1 day  after surgery and continue for 5 days. 10 mL 0  . lisinopril (ZESTRIL) 5 MG tablet Take 5 mg by mouth daily.    Marland Kitchen lovastatin (MEVACOR) 20 MG tablet Take 20 mg by mouth at bedtime.    . metFORMIN (GLUCOPHAGE) 500 MG tablet Take 500 mg by mouth daily with breakfast.     . naproxen sodium (ANAPROX) 220 MG tablet Take 220 mg by mouth 2 (two) times daily as needed (for pain). Reported on 10/07/2015    . warfarin (COUMADIN) 7.5 MG tablet TAKE 1 TABLET (7.5 MG TOTAL) BY MOUTH DAILY AT 6 PM. 90 tablet 1   No current facility-administered medications for this visit.    PHYSICAL EXAMINATION: ECOG PERFORMANCE STATUS: {CHL ONC ECOG PS:[id number]}  There were no vitals filed for this visit. There were no vitals filed for this visit. *** GENERAL:alert, no distress and comfortable SKIN: skin color, texture, turgor are normal, no rashes or significant lesions EYES: normal, Conjunctiva are pink and non-injected, sclera clear {OROPHARYNX:no exudate, no erythema and lips, buccal mucosa, and tongue normal}  NECK: supple, thyroid normal size, non-tender, without nodularity LYMPH:  no palpable lymphadenopathy in the cervical, axillary {or inguinal} LUNGS: clear to auscultation and percussion with normal breathing effort HEART: regular rate & rhythm and no murmurs and no lower extremity edema ABDOMEN:abdomen soft, non-tender and normal bowel sounds Musculoskeletal:no cyanosis of digits and no clubbing  NEURO: alert & oriented x 3 with fluent speech, no focal motor/sensory deficits  LABORATORY DATA:  I have reviewed the data as listed CBC Latest Ref Rng & Units 09/17/2019 01/29/2019 01/07/2019  WBC 4.0 - 10.5 K/uL 5.1 6.3 6.4  Hemoglobin 12.0 - 15.0 g/dL 11.6(L) 12.4 12.4  Hematocrit 36 - 46 % 36.5 39.0 38.4  Platelets 150 - 400 K/uL 267 271 265     CMP Latest Ref Rng & Units 01/15/2020 09/17/2019 07/21/2019  Glucose 70 - 99 mg/dL 84 100(H) -  BUN 6 - 20 mg/dL 15 18 -  Creatinine 0.44 - 1.00 mg/dL 0.74 0.71  0.81  Sodium 135 - 145 mmol/L 141 141 -  Potassium 3.5 - 5.1 mmol/L 4.2 4.1 -  Chloride 98 - 111 mmol/L 106 106 -  CO2 22 - 32 mmol/L 25 26 -  Calcium 8.9 - 10.3 mg/dL 9.1 8.8(L) -  Total Protein 6.5 - 8.1 g/dL 7.2 7.1 -  Total Bilirubin 0.3 - 1.2 mg/dL 0.3 <0.2(L) -  Alkaline Phos 38 - 126 U/L 125 82 -  AST 15 - 41 U/L 15 17 -  ALT 0 - 44 U/L 20 28 -      RADIOGRAPHIC STUDIES: I have personally reviewed the radiological images as listed and agreed with the findings in the report. No results found.   ASSESSMENT & PLAN:  Bianca Simmons is a 54 y.o. female with   1. Recurrent PE and DVT -She had a negative hypercoagulation workup -Giving the recurrent pulmonary and reason, especially second episode was unprovoked, young age, and low risk of bleeding, I recommend her to restart prophylactic anticoagulation indefinitely. She agrees. -She developed unprovoked bilateral PE when she was on Xarelto 20 mg daily in 04/2016.  -Labs reviewed, CBC and CMP WNL except BG 110, Ca 8.4. I encouraged her to start OTC calcium  supplement, INR 1.6. Incrase Coumadin from 7.5mg to 10mg  daily -She take multivitamin, I advised her to make sure there is no vitamin K. If so, switch to another without Vitamin K. I also advised her to have consistent diet and medications.  -repeat lab in 2 weeks, then every 1-2 months    2. Obesity, hyperglycemia -She recently started on Janumet by her PCP  -I encouraged her eat healthy, and exercise regularly. -I encouraged her to monitor HbA1c and lipid panel with PCP   3. Intermittent dizziness -She'll follow-up with her primary care physician -improved    Plan *** -incrase Coumadin to 10mg  daily, prescription call in today  -Lab in 2 weeks, then monthly x3 then every 2 months  -F/u in 1 year     No problem-specific Assessment & Plan notes found for this encounter.   No orders of the defined types were placed in this encounter.  All questions were  answered. The patient knows to call the clinic with any problems, questions or concerns. No barriers to learning was detected. The total time spent in the appointment was {CHL ONC TIME VISIT - ATFTD:[id number]}.     Joslyn Devon 04/28/2020   Oneal Deputy, am acting as scribe for Truitt Merle, MD.   {Add scribe attestation statement}

## 2020-04-29 ENCOUNTER — Inpatient Hospital Stay: Payer: BC Managed Care – PPO

## 2020-04-29 ENCOUNTER — Inpatient Hospital Stay: Payer: BC Managed Care – PPO | Attending: Hematology | Admitting: Hematology

## 2020-04-30 ENCOUNTER — Encounter: Payer: Self-pay | Admitting: Hematology

## 2020-04-30 ENCOUNTER — Telehealth: Payer: Self-pay | Admitting: Hematology

## 2020-04-30 ENCOUNTER — Inpatient Hospital Stay: Payer: BC Managed Care – PPO | Attending: Hematology | Admitting: Hematology

## 2020-04-30 ENCOUNTER — Other Ambulatory Visit: Payer: Self-pay

## 2020-04-30 ENCOUNTER — Inpatient Hospital Stay: Payer: BC Managed Care – PPO

## 2020-04-30 VITALS — BP 106/59 | HR 68 | Temp 97.9°F | Resp 17 | Ht 68.0 in | Wt 205.9 lb

## 2020-04-30 DIAGNOSIS — Z7901 Long term (current) use of anticoagulants: Secondary | ICD-10-CM | POA: Insufficient documentation

## 2020-04-30 DIAGNOSIS — Z86718 Personal history of other venous thrombosis and embolism: Secondary | ICD-10-CM

## 2020-04-30 DIAGNOSIS — I2699 Other pulmonary embolism without acute cor pulmonale: Secondary | ICD-10-CM

## 2020-04-30 DIAGNOSIS — Z86711 Personal history of pulmonary embolism: Secondary | ICD-10-CM | POA: Diagnosis not present

## 2020-04-30 DIAGNOSIS — I82409 Acute embolism and thrombosis of unspecified deep veins of unspecified lower extremity: Secondary | ICD-10-CM

## 2020-04-30 LAB — CBC WITH DIFFERENTIAL/PLATELET
Abs Immature Granulocytes: 0.01 10*3/uL (ref 0.00–0.07)
Basophils Absolute: 0 10*3/uL (ref 0.0–0.1)
Basophils Relative: 0 %
Eosinophils Absolute: 0.1 10*3/uL (ref 0.0–0.5)
Eosinophils Relative: 1 %
HCT: 38.3 % (ref 36.0–46.0)
Hemoglobin: 12.2 g/dL (ref 12.0–15.0)
Immature Granulocytes: 0 %
Lymphocytes Relative: 23 %
Lymphs Abs: 1.1 10*3/uL (ref 0.7–4.0)
MCH: 27.7 pg (ref 26.0–34.0)
MCHC: 31.9 g/dL (ref 30.0–36.0)
MCV: 87 fL (ref 80.0–100.0)
Monocytes Absolute: 0.5 10*3/uL (ref 0.1–1.0)
Monocytes Relative: 10 %
Neutro Abs: 3.2 10*3/uL (ref 1.7–7.7)
Neutrophils Relative %: 66 %
Platelets: 217 10*3/uL (ref 150–400)
RBC: 4.4 MIL/uL (ref 3.87–5.11)
RDW: 14.7 % (ref 11.5–15.5)
WBC: 4.9 10*3/uL (ref 4.0–10.5)
nRBC: 0 % (ref 0.0–0.2)

## 2020-04-30 LAB — CMP (CANCER CENTER ONLY)
ALT: 16 U/L (ref 0–44)
AST: 19 U/L (ref 15–41)
Albumin: 3.5 g/dL (ref 3.5–5.0)
Alkaline Phosphatase: 128 U/L — ABNORMAL HIGH (ref 38–126)
Anion gap: 8 (ref 5–15)
BUN: 12 mg/dL (ref 6–20)
CO2: 27 mmol/L (ref 22–32)
Calcium: 9.2 mg/dL (ref 8.9–10.3)
Chloride: 105 mmol/L (ref 98–111)
Creatinine: 0.88 mg/dL (ref 0.44–1.00)
GFR, Est AFR Am: >60 mL/min (ref >60)
GFR, Estimated: >60 mL/min (ref >60)
Glucose, Bld: 90 mg/dL (ref 70–99)
Potassium: 4 mmol/L (ref 3.5–5.1)
Sodium: 140 mmol/L (ref 135–145)
Total Bilirubin: 0.4 mg/dL (ref 0.3–1.2)
Total Protein: 7.6 g/dL (ref 6.5–8.1)

## 2020-04-30 LAB — PROTIME-INR
INR: 2.7 — ABNORMAL HIGH (ref 0.8–1.2)
Prothrombin Time: 28.1 seconds — ABNORMAL HIGH (ref 11.4–15.2)

## 2020-04-30 NOTE — Telephone Encounter (Signed)
Scheduled per los. Gave avs and calendar  

## 2020-04-30 NOTE — Progress Notes (Signed)
Bianca Simmons   [phone] Fax:(336) [phone]   Clinic Follow up Note   Patient Care Team: Dion Body, MD as PCP - General (Family Medicine)  Date of Service:  04/30/2020  CHIEF COMPLAINT: H/o DVT and PE  SUMMARY OF ONCOLOGIC HISTORY: 1. Pulmonary embolism, acute bilateral with high clot burden in association with an asymptomatic DVT of the right lower leg  diagnosed on 06/07/2012. She was treated with Xarelto (rivaroxaban). Doppler study of the right leg carried out on 08/20/2012 and CT angiogram of the chest carried out on 08/23/2012 showed resolution of previous blood clots. Hypercoagulable workup carried out on 06/08/2012 was negative  2. DVT involving the right lower extremity, asymptomatic at presentation with clots seen in the right distal popliteal, peroneal, and posterior tibial veins on Doppler study from 06/08/2012.  3. History of pulmonary embolism in association with birth control pills in 2001.  4. 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 Dose adjusted as indicated. Currently on 10mg  on Sunday and 7.5mg  Monday-Saturday.    INTERVAL HISTORY:  Bianca Simmons is here for a follow up of DVT/PE. She was last seen by me in 01/2019. She presents to the clinic alone. She notes she is doing well. She had a sebaceous cyst removed in 01/2020. She held Coumadin around then. She is currently on Coumadin 10mg  on Sunday and 7.5mg  Monday-Saturday.    REVIEW OF SYSTEMS:   Constitutional: Denies fevers, chills or abnormal weight loss Eyes: Denies blurriness of vision Ears, nose, mouth, throat, and face: Denies mucositis or sore throat Respiratory: Denies cough, dyspnea or wheezes Cardiovascular: Denies palpitation, chest discomfort or lower extremity swelling Gastrointestinal:  Denies nausea, heartburn or change in bowel habits Skin: Denies abnormal skin rashes Lymphatics: Denies new lymphadenopathy or easy  bruising Neurological:Denies numbness, tingling or new weaknesses Behavioral/Psych: Mood is stable, no new changes  All other systems were reviewed with the patient and are negative.  MEDICAL HISTORY:  Past Medical History:  Diagnosis Date  . Diabetes mellitus without complication ([place])   . Hyperlipidemia   . Hypertension   . Pulmonary embolism, blood-clot, obstetric 2002   on birth control-pulm blood clot-treated with coumadin-no problems now    SURGICAL HISTORY: Past Surgical History:  Procedure Laterality Date  . DILATATION & CURETTAGE/HYSTEROSCOPY WITH MYOSURE N/A 01/10/2019   Procedure: DILATATION & CURETTAGE/HYSTEROSCOPY WITH MYOSURE;  Surgeon: Benjaman Kindler, MD;  Location: [facility];  Service: Gynecology;  Laterality: N/A;  . HYSTEROSCOPY WITH D & C N/A 01/10/2019   Procedure: DILATATION AND CURETTAGE /HYSTEROSCOPY;  Surgeon: Benjaman Kindler, MD;  Location: [facility];  Service: Gynecology;  Laterality: N/A;  . IUD REMOVAL  OCT 2012  . LAPAROSCOPIC TUBAL LIGATION  07/14/2011   Procedure: LAPAROSCOPIC TUBAL LIGATION;  Surgeon: Agnes Lawrence, MD;  Location: [facility];  Service: Gynecology;  Laterality: N/A;  . NO PAST SURGERIES    . TUBAL LIGATION  04/21/2011   Procedure: ESSURE TUBAL STERILIZATION;  Surgeon: Agnes Lawrence, MD;  Location: [facility];  Service: Gynecology;  Laterality: Bilateral;   attempted essure,removal of IUD, hysteroscopy,         dilatation and currettage  . vaginal ablasion      I have reviewed the social history and family history with the patient and they are unchanged from previous note.  ALLERGIES:  is allergic to sulfa antibiotics.  MEDICATIONS:  Current Outpatient Medications  Medication Sig Dispense Refill  . acetaminophen (TYLENOL) 500 MG tablet Take by  mouth as needed.    Marland Kitchen COUMADIN 10 MG tablet TAKE 1 TABLET BY MOUTH EVERY DAY 90 tablet 1  . lisinopril (ZESTRIL) 5 MG tablet Take 5 mg by mouth daily.    Marland Kitchen lovastatin (MEVACOR) 20 MG  tablet Take 20 mg by mouth at bedtime.    . metFORMIN (GLUCOPHAGE) 500 MG tablet Take 500 mg by mouth daily with breakfast.     . warfarin (COUMADIN) 7.5 MG tablet TAKE 1 TABLET (7.5 MG TOTAL) BY MOUTH DAILY AT 6 PM. 90 tablet 1  . naproxen sodium (ANAPROX) 220 MG tablet Take 220 mg by mouth 2 (two) times daily as needed (for pain). Reported on 10/07/2015 (Patient not taking: Reported on 04/30/2020)     No current facility-administered medications for this visit.    PHYSICAL EXAMINATION: ECOG PERFORMANCE STATUS: 0 - Asymptomatic  Vitals:   04/30/20 0920  BP: (!) 106/59  Pulse: 68  Resp: 17  Temp: 97.9 F (36.6 C)   Filed Weights   04/30/20 0920  Weight: 205 lb 14.4 oz (93.4 kg)    Due to COVID19 we will limit examination to appearance. Patient had no complaints.  GENERAL:alert, no distress and comfortable SKIN: skin color normal, no rashes or significant lesions EYES: normal, Conjunctiva are pink and non-injected, sclera clear  NEURO: alert & oriented x 3 with fluent speech   LABORATORY DATA:  I have reviewed the data as listed CBC Latest Ref Rng & Units 04/30/2020 09/17/2019 01/29/2019  WBC 4.0 - 10.5 K/uL 4.9 5.1 6.3  Hemoglobin 12.0 - 15.0 g/dL 12.2 11.6(L) 12.4  Hematocrit 36 - 46 % 38.3 36.5 39.0  Platelets 150 - 400 K/uL 217 267 271     CMP Latest Ref Rng & Units 04/30/2020 01/15/2020 09/17/2019  Glucose 70 - 99 mg/dL 90 84 100(H)  BUN 6 - 20 mg/dL 12 15 18   Creatinine 0.44 - 1.00 mg/dL 0.88 0.74 0.71  Sodium 135 - 145 mmol/L 140 141 141  Potassium 3.5 - 5.1 mmol/L 4.0 4.2 4.1  Chloride 98 - 111 mmol/L 105 106 106  CO2 22 - 32 mmol/L 27 25 26   Calcium 8.9 - 10.3 mg/dL 9.2 9.1 8.8(L)  Total Protein 6.5 - 8.1 g/dL 7.6 7.2 7.1  Total Bilirubin 0.3 - 1.2 mg/dL 0.4 0.3 <0.2(L)  Alkaline Phos 38 - 126 U/L 128(H) 125 82  AST 15 - 41 U/L 19 15 17   ALT 0 - 44 U/L 16 20 28       RADIOGRAPHIC STUDIES: I have personally reviewed the radiological images as listed and agreed  with the findings in the report. No results found.   ASSESSMENT & PLAN:  Bianca Simmons is a 54 y.o. female with    1. Recurrent PE and DVT -She had a negative hypercoagulation workup -Giving the recurrent pulmonary and reason, especially second episode was unprovoked, young age, and low risk of bleeding, I recommend her to restart prophylactic anticoagulation indefinitely. She agrees. -She no longer her periods.  -She developed unprovoked bilateral PE when she was on Xarelto 20 mg daily in 04/2016.  -She lost follow up after 01/2019 but has continued Coumadin and tolerated well. She is currently on Coumadin 10mg  on Sundays and 7.5mg  Monday-Saturday.  -She is clinically doing well. Labs reviewed today, CBC and CMP WNL. PT 28.1, INR 2.7.  -She can continue Coumadin at same dose, 10mg  on Sundays and 7.5mg  Monday-Saturday. I reviewed risk of bleeding and to contact clinic for any invasive procedures or surgery.  -  Monitor with labs monthly and F/u in 1 year.    2. Obesity, hyperglycemia, HLD, HTN -Continue to f/u with PCP    Plan -Continue Coumadin 10mg  on Sundays and 7.5mg  Monday-Saturday.  -Lab monthly X12 -F/u in 12 months    No problem-specific Assessment & Plan notes found for this encounter.   No orders of the defined types were placed in this encounter.  All questions were answered. The patient knows to call the clinic with any problems, questions or concerns. No barriers to learning was detected. The total time spent in the appointment was 20 minutes.     Truitt Merle, MD 04/30/2020   I, Joslyn Devon, am acting as scribe for Truitt Merle, MD.   I have reviewed the above documentation for accuracy and completeness, and I agree with the above.

## 2020-05-31 ENCOUNTER — Inpatient Hospital Stay: Payer: BC Managed Care – PPO | Attending: Hematology

## 2020-05-31 ENCOUNTER — Other Ambulatory Visit: Payer: Self-pay

## 2020-05-31 DIAGNOSIS — I2699 Other pulmonary embolism without acute cor pulmonale: Secondary | ICD-10-CM

## 2020-05-31 DIAGNOSIS — Z86718 Personal history of other venous thrombosis and embolism: Secondary | ICD-10-CM | POA: Insufficient documentation

## 2020-05-31 LAB — PROTIME-INR
INR: 2.6 — ABNORMAL HIGH (ref 0.8–1.2)
Prothrombin Time: 27 seconds — ABNORMAL HIGH (ref 11.4–15.2)

## 2020-06-01 ENCOUNTER — Encounter: Payer: Self-pay | Admitting: Hematology

## 2020-06-02 ENCOUNTER — Other Ambulatory Visit: Payer: Self-pay | Admitting: Hematology

## 2020-06-02 DIAGNOSIS — Z86718 Personal history of other venous thrombosis and embolism: Secondary | ICD-10-CM

## 2020-07-05 ENCOUNTER — Inpatient Hospital Stay: Payer: BC Managed Care – PPO | Attending: Hematology

## 2020-08-02 ENCOUNTER — Inpatient Hospital Stay: Payer: BC Managed Care – PPO | Attending: Hematology

## 2020-09-06 ENCOUNTER — Inpatient Hospital Stay: Payer: BC Managed Care – PPO | Attending: Hematology

## 2020-09-06 ENCOUNTER — Other Ambulatory Visit: Payer: Self-pay

## 2020-09-06 DIAGNOSIS — I2699 Other pulmonary embolism without acute cor pulmonale: Secondary | ICD-10-CM

## 2020-09-06 DIAGNOSIS — Z86718 Personal history of other venous thrombosis and embolism: Secondary | ICD-10-CM | POA: Insufficient documentation

## 2020-09-06 LAB — PROTIME-INR
INR: 1.8 — ABNORMAL HIGH (ref 0.8–1.2)
Prothrombin Time: 20.4 seconds — ABNORMAL HIGH (ref 11.4–15.2)

## 2020-09-07 ENCOUNTER — Encounter: Payer: Self-pay | Admitting: Hematology

## 2020-10-04 ENCOUNTER — Other Ambulatory Visit: Payer: Self-pay

## 2020-10-04 ENCOUNTER — Inpatient Hospital Stay: Payer: BC Managed Care – PPO | Attending: Hematology

## 2020-10-04 DIAGNOSIS — Z7901 Long term (current) use of anticoagulants: Secondary | ICD-10-CM | POA: Diagnosis not present

## 2020-10-04 DIAGNOSIS — Z86718 Personal history of other venous thrombosis and embolism: Secondary | ICD-10-CM | POA: Diagnosis not present

## 2020-10-04 DIAGNOSIS — I2699 Other pulmonary embolism without acute cor pulmonale: Secondary | ICD-10-CM

## 2020-10-04 LAB — PROTIME-INR
INR: 2 — ABNORMAL HIGH (ref 0.8–1.2)
Prothrombin Time: 21.8 seconds — ABNORMAL HIGH (ref 11.4–15.2)

## 2020-10-06 ENCOUNTER — Telehealth: Payer: Self-pay

## 2020-10-06 NOTE — Telephone Encounter (Signed)
-----   Message from Truitt Merle, MD sent at 10/05/2020  8:04 PM EST ----- Please let pt know her lab result, INR is therapeutic, continue current coumadin dose, and continue monitoring.   Truitt Merle  10/05/2020

## 2020-10-06 NOTE — Telephone Encounter (Signed)
I spoke with Bianca Simmons.  I relayed Dr Ernestina Penna comments and recommendations.  She verbalized understanding.

## 2020-11-01 ENCOUNTER — Other Ambulatory Visit: Payer: Self-pay

## 2020-11-01 ENCOUNTER — Encounter: Payer: Self-pay | Admitting: Hematology

## 2020-11-01 ENCOUNTER — Inpatient Hospital Stay: Payer: BC Managed Care – PPO | Attending: Hematology

## 2020-11-01 DIAGNOSIS — I2699 Other pulmonary embolism without acute cor pulmonale: Secondary | ICD-10-CM

## 2020-11-01 DIAGNOSIS — Z86718 Personal history of other venous thrombosis and embolism: Secondary | ICD-10-CM | POA: Diagnosis present

## 2020-11-01 DIAGNOSIS — Z7901 Long term (current) use of anticoagulants: Secondary | ICD-10-CM | POA: Insufficient documentation

## 2020-11-01 LAB — CBC WITH DIFFERENTIAL/PLATELET
Abs Immature Granulocytes: 0.02 10*3/uL (ref 0.00–0.07)
Basophils Absolute: 0 10*3/uL (ref 0.0–0.1)
Basophils Relative: 1 %
Eosinophils Absolute: 0.1 10*3/uL (ref 0.0–0.5)
Eosinophils Relative: 1 %
HCT: 38.5 % (ref 36.0–46.0)
Hemoglobin: 12.5 g/dL (ref 12.0–15.0)
Immature Granulocytes: 0 %
Lymphocytes Relative: 20 %
Lymphs Abs: 1.5 10*3/uL (ref 0.7–4.0)
MCH: 29.4 pg (ref 26.0–34.0)
MCHC: 32.5 g/dL (ref 30.0–36.0)
MCV: 90.6 fL (ref 80.0–100.0)
Monocytes Absolute: 0.5 10*3/uL (ref 0.1–1.0)
Monocytes Relative: 6 %
Neutro Abs: 5.5 10*3/uL (ref 1.7–7.7)
Neutrophils Relative %: 72 %
Platelets: 233 10*3/uL (ref 150–400)
RBC: 4.25 MIL/uL (ref 3.87–5.11)
RDW: 13.7 % (ref 11.5–15.5)
WBC: 7.6 10*3/uL (ref 4.0–10.5)
nRBC: 0 % (ref 0.0–0.2)

## 2020-11-01 LAB — CMP (CANCER CENTER ONLY)
ALT: 16 U/L (ref 0–44)
AST: 15 U/L (ref 15–41)
Albumin: 3.6 g/dL (ref 3.5–5.0)
Alkaline Phosphatase: 115 U/L (ref 38–126)
Anion gap: 10 (ref 5–15)
BUN: 18 mg/dL (ref 6–20)
CO2: 24 mmol/L (ref 22–32)
Calcium: 8.4 mg/dL — ABNORMAL LOW (ref 8.9–10.3)
Chloride: 107 mmol/L (ref 98–111)
Creatinine: 0.88 mg/dL (ref 0.44–1.00)
GFR, Estimated: >60 mL/min (ref >60)
Glucose, Bld: 101 mg/dL — ABNORMAL HIGH (ref 70–99)
Potassium: 4.3 mmol/L (ref 3.5–5.1)
Sodium: 141 mmol/L (ref 135–145)
Total Bilirubin: 0.2 mg/dL — ABNORMAL LOW (ref 0.3–1.2)
Total Protein: 7.6 g/dL (ref 6.5–8.1)

## 2020-11-01 LAB — PROTIME-INR
INR: 2.1 — ABNORMAL HIGH (ref 0.8–1.2)
Prothrombin Time: 23.2 seconds — ABNORMAL HIGH (ref 11.4–15.2)

## 2020-11-29 ENCOUNTER — Other Ambulatory Visit: Payer: Self-pay

## 2020-11-29 ENCOUNTER — Inpatient Hospital Stay: Payer: BC Managed Care – PPO | Attending: Hematology

## 2020-11-29 DIAGNOSIS — I2699 Other pulmonary embolism without acute cor pulmonale: Secondary | ICD-10-CM

## 2020-11-29 LAB — PROTIME-INR
INR: 2.5 — ABNORMAL HIGH (ref 0.8–1.2)
Prothrombin Time: 26.5 seconds — ABNORMAL HIGH (ref 11.4–15.2)

## 2020-12-01 ENCOUNTER — Encounter: Payer: Self-pay | Admitting: Hematology

## 2020-12-08 ENCOUNTER — Telehealth: Payer: Self-pay

## 2020-12-08 ENCOUNTER — Other Ambulatory Visit: Payer: Self-pay

## 2020-12-08 DIAGNOSIS — R791 Abnormal coagulation profile: Secondary | ICD-10-CM

## 2020-12-08 MED ORDER — PHYTONADIONE 5 MG PO TABS: 2.5000 mg | ORAL_TABLET | Freq: Once | ORAL | 0 refills | Status: AC

## 2020-12-08 MED ORDER — PHYTONADIONE 5 MG PO TABS
2.5000 mg | ORAL_TABLET | Freq: Once | ORAL | Status: DC
  Filled 2020-12-08: qty 1

## 2020-12-08 NOTE — Telephone Encounter (Signed)
Bianca Simmons called stating she needs to have a tooth extracted.  She needs instructions for her coumadin and clearance letter.  Per Dr Burr Medico she is to hold coumadin tonight.  She is to take Vitamin K 2.5 mg orally tonight.  She can resume coumadin the day after tooth extraction if there is no bleeding.  She verbalized understanding.

## 2020-12-27 ENCOUNTER — Other Ambulatory Visit: Payer: Self-pay | Admitting: Hematology

## 2020-12-27 DIAGNOSIS — I2782 Chronic pulmonary embolism: Secondary | ICD-10-CM

## 2020-12-30 ENCOUNTER — Telehealth: Payer: Self-pay

## 2020-12-30 NOTE — Telephone Encounter (Signed)
Bianca Simmons is having 1 molar extracted on 01/05/2021 and has called for instructions with her coumadin.  Per Dr Burr Medico she is to hold coumadin starting 6/3 until 6/9 or 6/10. Bianca Simmons verbalized understanding.  Bianca Simmons states the Dentist is requesting an INR be drawn a couple days before procedure.  Pt has lab appt on 6/6 for INR.

## 2021-01-03 ENCOUNTER — Inpatient Hospital Stay: Payer: BC Managed Care – PPO | Attending: Hematology

## 2021-01-03 ENCOUNTER — Other Ambulatory Visit: Payer: Self-pay

## 2021-01-03 DIAGNOSIS — I2699 Other pulmonary embolism without acute cor pulmonale: Secondary | ICD-10-CM | POA: Diagnosis not present

## 2021-01-03 LAB — PROTIME-INR
INR: 1.1 (ref 0.8–1.2)
Prothrombin Time: 14.6 seconds (ref 11.4–15.2)

## 2021-01-04 ENCOUNTER — Telehealth: Payer: Self-pay

## 2021-01-04 NOTE — Telephone Encounter (Signed)
This nurse returned call about patients INR results.  Spoke with Nurse at Dr. Hardie Shackleton office who stated that results were received via e-mail on 01/03/21.  No further questions or concerns at this time.

## 2021-02-01 ENCOUNTER — Inpatient Hospital Stay: Payer: BC Managed Care – PPO | Attending: Hematology

## 2021-02-01 ENCOUNTER — Other Ambulatory Visit: Payer: Self-pay

## 2021-02-01 DIAGNOSIS — Z86718 Personal history of other venous thrombosis and embolism: Secondary | ICD-10-CM | POA: Insufficient documentation

## 2021-02-01 DIAGNOSIS — Z7901 Long term (current) use of anticoagulants: Secondary | ICD-10-CM | POA: Diagnosis not present

## 2021-02-01 DIAGNOSIS — Z79899 Other long term (current) drug therapy: Secondary | ICD-10-CM | POA: Diagnosis not present

## 2021-02-01 DIAGNOSIS — Z7984 Long term (current) use of oral hypoglycemic drugs: Secondary | ICD-10-CM | POA: Insufficient documentation

## 2021-02-01 DIAGNOSIS — I2699 Other pulmonary embolism without acute cor pulmonale: Secondary | ICD-10-CM

## 2021-02-01 DIAGNOSIS — Z86711 Personal history of pulmonary embolism: Secondary | ICD-10-CM | POA: Insufficient documentation

## 2021-02-01 LAB — PROTIME-INR
INR: 2.5 — ABNORMAL HIGH (ref 0.8–1.2)
Prothrombin Time: 27.2 seconds — ABNORMAL HIGH (ref 11.4–15.2)

## 2021-02-03 ENCOUNTER — Telehealth: Payer: Self-pay | Admitting: *Deleted

## 2021-02-03 NOTE — Telephone Encounter (Signed)
-----   Message from Alla Feeling, NP sent at 02/01/2021  8:21 PM EDT ----- Please let her know INR is 2.5, continue coumadin same dose (our records indicate 10 mg Sunday and 7.5 mg mon - sat). Please report any bleeding or planned procedures for further management.  Thanks, Regan Rakers, NP

## 2021-02-03 NOTE — Telephone Encounter (Signed)
Per Cira Rue, NP reached out to patient to communicate her INR levels.  She confirmed the dosage on file.    Future appointments on file.   NO further questions at this time.

## 2021-02-28 ENCOUNTER — Other Ambulatory Visit: Payer: BC Managed Care – PPO

## 2021-03-09 ENCOUNTER — Inpatient Hospital Stay: Payer: BC Managed Care – PPO | Attending: Hematology

## 2021-03-09 ENCOUNTER — Other Ambulatory Visit: Payer: Self-pay

## 2021-03-09 DIAGNOSIS — Z7984 Long term (current) use of oral hypoglycemic drugs: Secondary | ICD-10-CM | POA: Insufficient documentation

## 2021-03-09 DIAGNOSIS — Z79899 Other long term (current) drug therapy: Secondary | ICD-10-CM | POA: Insufficient documentation

## 2021-03-09 DIAGNOSIS — Z86718 Personal history of other venous thrombosis and embolism: Secondary | ICD-10-CM | POA: Insufficient documentation

## 2021-03-09 DIAGNOSIS — Z86711 Personal history of pulmonary embolism: Secondary | ICD-10-CM | POA: Insufficient documentation

## 2021-03-09 DIAGNOSIS — Z7901 Long term (current) use of anticoagulants: Secondary | ICD-10-CM | POA: Diagnosis not present

## 2021-03-09 DIAGNOSIS — I2699 Other pulmonary embolism without acute cor pulmonale: Secondary | ICD-10-CM

## 2021-03-09 LAB — PROTIME-INR
INR: 2.4 — ABNORMAL HIGH (ref 0.8–1.2)
Prothrombin Time: 26.5 seconds — ABNORMAL HIGH (ref 11.4–15.2)

## 2021-03-13 ENCOUNTER — Encounter: Payer: Self-pay | Admitting: Hematology

## 2021-04-05 ENCOUNTER — Inpatient Hospital Stay: Payer: BC Managed Care – PPO | Attending: Hematology

## 2021-04-21 ENCOUNTER — Other Ambulatory Visit: Payer: Self-pay | Admitting: Hematology

## 2021-04-21 DIAGNOSIS — I2782 Chronic pulmonary embolism: Secondary | ICD-10-CM

## 2021-05-02 ENCOUNTER — Encounter: Payer: Self-pay | Admitting: Hematology

## 2021-05-02 ENCOUNTER — Inpatient Hospital Stay: Payer: BC Managed Care – PPO

## 2021-05-02 ENCOUNTER — Inpatient Hospital Stay: Payer: BC Managed Care – PPO | Attending: Hematology | Admitting: Hematology

## 2021-05-02 ENCOUNTER — Other Ambulatory Visit: Payer: Self-pay

## 2021-05-02 VITALS — BP 118/56 | HR 72 | Temp 98.2°F | Resp 18 | Ht 68.0 in | Wt 216.3 lb

## 2021-05-02 DIAGNOSIS — E1165 Type 2 diabetes mellitus with hyperglycemia: Secondary | ICD-10-CM | POA: Insufficient documentation

## 2021-05-02 DIAGNOSIS — Z1231 Encounter for screening mammogram for malignant neoplasm of breast: Secondary | ICD-10-CM

## 2021-05-02 DIAGNOSIS — Z86718 Personal history of other venous thrombosis and embolism: Secondary | ICD-10-CM

## 2021-05-02 DIAGNOSIS — Z7901 Long term (current) use of anticoagulants: Secondary | ICD-10-CM | POA: Insufficient documentation

## 2021-05-02 DIAGNOSIS — E669 Obesity, unspecified: Secondary | ICD-10-CM | POA: Insufficient documentation

## 2021-05-02 DIAGNOSIS — I10 Essential (primary) hypertension: Secondary | ICD-10-CM | POA: Diagnosis not present

## 2021-05-02 DIAGNOSIS — E785 Hyperlipidemia, unspecified: Secondary | ICD-10-CM | POA: Diagnosis not present

## 2021-05-02 DIAGNOSIS — Z86711 Personal history of pulmonary embolism: Secondary | ICD-10-CM | POA: Insufficient documentation

## 2021-05-02 DIAGNOSIS — I2782 Chronic pulmonary embolism: Secondary | ICD-10-CM

## 2021-05-02 DIAGNOSIS — I2699 Other pulmonary embolism without acute cor pulmonale: Secondary | ICD-10-CM

## 2021-05-02 LAB — CMP (CANCER CENTER ONLY)
ALT: 16 U/L (ref 0–44)
AST: 18 U/L (ref 15–41)
Albumin: 3.7 g/dL (ref 3.5–5.0)
Alkaline Phosphatase: 107 U/L (ref 38–126)
Anion gap: 10 (ref 5–15)
BUN: 13 mg/dL (ref 6–20)
CO2: 24 mmol/L (ref 22–32)
Calcium: 8.9 mg/dL (ref 8.9–10.3)
Chloride: 108 mmol/L (ref 98–111)
Creatinine: 0.85 mg/dL (ref 0.44–1.00)
GFR, Estimated: >60 mL/min (ref >60)
Glucose, Bld: 91 mg/dL (ref 70–99)
Potassium: 3.8 mmol/L (ref 3.5–5.1)
Sodium: 142 mmol/L (ref 135–145)
Total Bilirubin: 0.4 mg/dL (ref 0.3–1.2)
Total Protein: 7.8 g/dL (ref 6.5–8.1)

## 2021-05-02 LAB — CBC WITH DIFFERENTIAL/PLATELET
Abs Immature Granulocytes: 0.01 10*3/uL (ref 0.00–0.07)
Basophils Absolute: 0 10*3/uL (ref 0.0–0.1)
Basophils Relative: 0 %
Eosinophils Absolute: 0 10*3/uL (ref 0.0–0.5)
Eosinophils Relative: 1 %
HCT: 38 % (ref 36.0–46.0)
Hemoglobin: 12.6 g/dL (ref 12.0–15.0)
Immature Granulocytes: 0 %
Lymphocytes Relative: 35 %
Lymphs Abs: 1.6 10*3/uL (ref 0.7–4.0)
MCH: 29.5 pg (ref 26.0–34.0)
MCHC: 33.2 g/dL (ref 30.0–36.0)
MCV: 89 fL (ref 80.0–100.0)
Monocytes Absolute: 0.4 10*3/uL (ref 0.1–1.0)
Monocytes Relative: 8 %
Neutro Abs: 2.6 10*3/uL (ref 1.7–7.7)
Neutrophils Relative %: 56 %
Platelets: 234 10*3/uL (ref 150–400)
RBC: 4.27 MIL/uL (ref 3.87–5.11)
RDW: 13.8 % (ref 11.5–15.5)
WBC: 4.7 10*3/uL (ref 4.0–10.5)
nRBC: 0 % (ref 0.0–0.2)

## 2021-05-02 LAB — PROTIME-INR
INR: 2.2 — ABNORMAL HIGH (ref 0.8–1.2)
Prothrombin Time: 24.7 seconds — ABNORMAL HIGH (ref 11.4–15.2)

## 2021-05-02 NOTE — Progress Notes (Signed)
Bianca Simmons   [phone] Fax:([phone]) [phone]   Clinic Follow up Note   Patient Care Team: Dion Body, MD as PCP - General (Family Medicine)  Date of Service:  05/02/2021  CHIEF COMPLAINT: f/u of h/o DVT and PE  CURRENT THERAPY:  Coumadin  7.5mg  daily. Dose adjusted as indicated. Currently on 10mg  on Sunday and 7.5mg  Monday-Saturday.   ASSESSMENT & PLAN:  Bianca Simmons is a 55 y.o. female with   1. Recurrent PE and DVT -She had a negative hypercoagulation workup -Giving the recurrent pulmonary and reason, especially second episode was unprovoked, young age, and low risk of bleeding, I recommend her to restart prophylactic anticoagulation indefinitely. She agrees. -She no longer has periods.  -She developed unprovoked bilateral PE when she was on Xarelto 20 mg daily in 04/2016.  -She lost follow up after 01/2019 but has continued Coumadin and tolerated well. She is currently on Coumadin 10mg  on Sundays and 7.5mg  Monday-Saturday.  -She is clinically doing well. Labs reviewed today, CBC WNL. Other labs pending, we will contact her with the results. -She can continue Coumadin at same dose, 10mg  on Sundays and 7.5mg  Monday-Saturday. I reviewed risk of bleeding and to contact clinic for any invasive procedures or surgery.  -Monitor with labs monthly and F/u in 1 year.   2. Cancer Screenings -she is overdue for mammography. Last one on record is from 2012. I will order repeat for her today. -she does not follow with GYN regularly. I recommend she ask her PCP to perform pap smear at her next visit. She is agreeable. -she previously had colonoscopy, which she believes was performed when she was 55 years old. I advised her to call them and check if she is due for repeat. I discussed she can hold coumadin for colonoscopy.   3. Comorbidities: Obesity, hyperglycemia, HLD, HTN -Continue to f/u with PCP      Plan -Continue Coumadin 10mg  on Sundays and 7.5mg   Monday-Saturday.  -Lab monthly X12 -F/u in 12 months -mammogram at [facility] next month    No problem-specific Assessment & Plan notes found for this encounter.   SUMMARY OF ONCOLOGIC HISTORY: 1. Pulmonary embolism, acute bilateral with high clot burden in association with an asymptomatic DVT of the right lower leg   diagnosed on 06/07/2012. She was treated with Xarelto (rivaroxaban). Doppler study of the right leg carried out on 08/20/2012 and CT angiogram of the chest carried out on 08/23/2012 showed resolution of previous blood clots. Hypercoagulable workup carried out on 06/08/2012 was negative  2. DVT involving the right lower extremity, asymptomatic at  presentation with clots seen in the right distal popliteal, peroneal, and posterior tibial veins on Doppler study from  06/08/2012.   3. History of pulmonary embolism in association with birth control pills in 2001.   4. Unprovoked bilateral PE on 05/18/2016   INTERVAL HISTORY:  Bianca Simmons is here for a follow up of h/o DVT/PE. She was last seen by me a year ago. She presents to the clinic alone.   All other systems were reviewed with the patient and are negative.  MEDICAL HISTORY:  Past Medical History:  Diagnosis Date   Diabetes mellitus without complication ([place])    Hyperlipidemia    Hypertension    Pulmonary embolism, blood-clot, obstetric 2002   on birth control-pulm blood clot-treated with coumadin-no problems now    SURGICAL HISTORY: Past Surgical History:  Procedure Laterality Date   DILATATION & CURETTAGE/HYSTEROSCOPY WITH MYOSURE N/A 01/10/2019  Procedure: [place];  Surgeon: Benjaman Kindler, MD;  Location: [facility];  Service: Gynecology;  Laterality: N/A;   HYSTEROSCOPY WITH D & C N/A 01/10/2019   Procedure: DILATATION AND CURETTAGE /HYSTEROSCOPY;  Surgeon: Benjaman Kindler, MD;  Location: [facility];  Service: Gynecology;  Laterality: N/A;   IUD REMOVAL  OCT 2012    LAPAROSCOPIC TUBAL LIGATION  07/14/2011   Procedure: LAPAROSCOPIC TUBAL LIGATION;  Surgeon: Agnes Lawrence, MD;  Location: [place] [facility];  Service: Gynecology;  Laterality: N/A;   NO PAST SURGERIES     TUBAL LIGATION  04/21/2011   Procedure: ESSURE TUBAL STERILIZATION;  Surgeon: Agnes Lawrence, MD;  Location: [facility];  Service: Gynecology;  Laterality: Bilateral;   attempted essure,removal of IUD, hysteroscopy,         dilatation and currettage   vaginal ablasion      I have reviewed the social history and family history with the patient and they are unchanged from previous note.  ALLERGIES:  is allergic to sulfa antibiotics.  MEDICATIONS:  Current Outpatient Medications  Medication Sig Dispense Refill   acetaminophen (TYLENOL) 500 MG tablet Take by mouth as needed.     lisinopril (ZESTRIL) 5 MG tablet Take 5 mg by mouth daily.     lovastatin (MEVACOR) 20 MG tablet Take 20 mg by mouth at bedtime.     metFORMIN (GLUCOPHAGE) 500 MG tablet Take 500 mg by mouth daily with breakfast.      naproxen sodium (ANAPROX) 220 MG tablet Take 220 mg by mouth 2 (two) times daily as needed (for pain). Reported on 10/07/2015 (Patient not taking: Reported on 04/30/2020)     warfarin (COUMADIN) 10 MG tablet TAKE 1 TABLET BY MOUTH EVERY DAY 90 tablet 1   warfarin (COUMADIN) 7.5 MG tablet TAKE 1 TABLET (7.5 MG TOTAL) BY MOUTH DAILY AT 6 PM. 90 tablet 1   No current facility-administered medications for this visit.    PHYSICAL EXAMINATION: ECOG PERFORMANCE STATUS: 0 - Asymptomatic  Vitals:   05/02/21 0842  BP: (!) 118/56  Pulse: 72  Resp: 18  Temp: 98.2 F (36.8 C)  SpO2: 100%   Wt Readings from Last 3 Encounters:  05/02/21 216 lb 4.8 oz (98.1 kg)  04/30/20 205 lb 14.4 oz (93.4 kg)  01/29/19 220 lb 3.2 oz (99.9 kg)     GENERAL:alert, no distress and comfortable SKIN: skin color normal, no rashes or significant lesions EYES: normal, Conjunctiva are pink and non-injected, sclera clear  NEURO:  alert & oriented x 3 with fluent speech  LABORATORY DATA:  I have reviewed the data as listed CBC Latest Ref Rng & Units 05/02/2021 11/01/2020 04/30/2020  WBC 4.0 - 10.5 K/uL 4.7 7.6 4.9  Hemoglobin 12.0 - 15.0 g/dL 12.6 12.5 12.2  Hematocrit 36.0 - 46.0 % 38.0 38.5 38.3  Platelets 150 - 400 K/uL 234 233 217     CMP Latest Ref Rng & Units 05/02/2021 11/01/2020 04/30/2020  Glucose 70 - 99 mg/dL 91 101(H) 90  BUN 6 - 20 mg/dL 13 18 12   Creatinine 0.44 - 1.00 mg/dL 0.85 0.88 0.88  Sodium 135 - 145 mmol/L 142 141 140  Potassium 3.5 - 5.1 mmol/L 3.8 4.3 4.0  Chloride 98 - 111 mmol/L 108 107 105  CO2 22 - 32 mmol/L 24 24 27   Calcium 8.9 - 10.3 mg/dL 8.9 8.4(L) 9.2  Total Protein 6.5 - 8.1 g/dL 7.8 7.6 7.6  Total Bilirubin 0.3 - 1.2 mg/dL 0.4 0.2(L) 0.4  Alkaline Phos 38 - 126 U/L 107 115 128(H)  AST 15 - 41 U/L 18 15 19   ALT 0 - 44 U/L 16 16 16       RADIOGRAPHIC STUDIES: I have personally reviewed the radiological images as listed and agreed with the findings in the report. No results found.    Orders Placed This Encounter  Procedures   MM Digital Screening    Standing Status:   Future    Standing Expiration Date:   05/02/2022    Order Specific Question:   Reason for Exam (SYMPTOM  OR DIAGNOSIS REQUIRED)    Answer:   screening    Order Specific Question:   Is the patient pregnant?    Answer:   No    Order Specific Question:   Preferred imaging location?    Answer:   [facility]   All questions were answered. The patient knows to call the clinic with any problems, questions or concerns. No barriers to learning was detected. The total time spent in the appointment was 20 minutes.     Truitt Merle, MD 05/02/2021   I, Wilburn Mylar, am acting as scribe for Truitt Merle, MD.   I have reviewed the above documentation for accuracy and completeness, and I agree with the above.

## 2021-05-31 ENCOUNTER — Other Ambulatory Visit: Payer: Self-pay

## 2021-05-31 DIAGNOSIS — R791 Abnormal coagulation profile: Secondary | ICD-10-CM

## 2021-05-31 DIAGNOSIS — I82409 Acute embolism and thrombosis of unspecified deep veins of unspecified lower extremity: Secondary | ICD-10-CM

## 2021-06-01 ENCOUNTER — Inpatient Hospital Stay: Payer: BC Managed Care – PPO | Attending: Hematology

## 2021-06-21 ENCOUNTER — Telehealth: Payer: Self-pay

## 2021-06-21 ENCOUNTER — Other Ambulatory Visit: Payer: Self-pay

## 2021-06-21 NOTE — Telephone Encounter (Signed)
Gave Dr. Burr Medico fax from [place] clinic regarding pt's recent labs (INR) since the pt is on Warfarin.

## 2021-06-21 NOTE — Telephone Encounter (Signed)
Pt's recent labs taken on 06/17/2021 for Prothrombin Time (INR) was reviewed by Dr. Burr Medico.  Dr. Burr Medico had no further concerns or Warfarin dose changes at this time.  Pt will return for labs on 07/04/2021.

## 2021-07-04 ENCOUNTER — Inpatient Hospital Stay: Payer: BC Managed Care – PPO | Attending: Hematology

## 2021-08-03 ENCOUNTER — Inpatient Hospital Stay: Payer: BC Managed Care – PPO | Attending: Hematology

## 2021-09-05 ENCOUNTER — Inpatient Hospital Stay: Payer: BC Managed Care – PPO | Attending: Hematology

## 2021-09-05 ENCOUNTER — Other Ambulatory Visit: Payer: Self-pay

## 2021-09-05 DIAGNOSIS — Z86711 Personal history of pulmonary embolism: Secondary | ICD-10-CM | POA: Diagnosis not present

## 2021-09-05 DIAGNOSIS — Z86718 Personal history of other venous thrombosis and embolism: Secondary | ICD-10-CM | POA: Insufficient documentation

## 2021-09-05 DIAGNOSIS — R791 Abnormal coagulation profile: Secondary | ICD-10-CM

## 2021-09-05 DIAGNOSIS — I82409 Acute embolism and thrombosis of unspecified deep veins of unspecified lower extremity: Secondary | ICD-10-CM

## 2021-09-05 LAB — PROTIME-INR
INR: 2.5 — ABNORMAL HIGH (ref 0.8–1.2)
Prothrombin Time: 26.9 seconds — ABNORMAL HIGH (ref 11.4–15.2)

## 2021-09-05 LAB — COMPREHENSIVE METABOLIC PANEL
ALT: 14 U/L (ref 0–44)
AST: 15 U/L (ref 15–41)
Albumin: 3.9 g/dL (ref 3.5–5.0)
Alkaline Phosphatase: 102 U/L (ref 38–126)
Anion gap: 6 (ref 5–15)
BUN: 15 mg/dL (ref 6–20)
CO2: 27 mmol/L (ref 22–32)
Calcium: 9 mg/dL (ref 8.9–10.3)
Chloride: 107 mmol/L (ref 98–111)
Creatinine, Ser: 0.84 mg/dL (ref 0.44–1.00)
GFR, Estimated: >60 mL/min (ref >60)
Glucose, Bld: 103 mg/dL — ABNORMAL HIGH (ref 70–99)
Potassium: 4.6 mmol/L (ref 3.5–5.1)
Sodium: 140 mmol/L (ref 135–145)
Total Bilirubin: 0.4 mg/dL (ref 0.3–1.2)
Total Protein: 7.5 g/dL (ref 6.5–8.1)

## 2021-09-05 LAB — CBC WITH DIFFERENTIAL/PLATELET
Abs Immature Granulocytes: 0.01 10*3/uL (ref 0.00–0.07)
Basophils Absolute: 0 10*3/uL (ref 0.0–0.1)
Basophils Relative: 1 %
Eosinophils Absolute: 0.1 10*3/uL (ref 0.0–0.5)
Eosinophils Relative: 1 %
HCT: 39.4 % (ref 36.0–46.0)
Hemoglobin: 12.6 g/dL (ref 12.0–15.0)
Immature Granulocytes: 0 %
Lymphocytes Relative: 27 %
Lymphs Abs: 1.7 10*3/uL (ref 0.7–4.0)
MCH: 29.4 pg (ref 26.0–34.0)
MCHC: 32 g/dL (ref 30.0–36.0)
MCV: 91.8 fL (ref 80.0–100.0)
Monocytes Absolute: 0.5 10*3/uL (ref 0.1–1.0)
Monocytes Relative: 8 %
Neutro Abs: 4 10*3/uL (ref 1.7–7.7)
Neutrophils Relative %: 63 %
Platelets: 247 10*3/uL (ref 150–400)
RBC: 4.29 MIL/uL (ref 3.87–5.11)
RDW: 13.5 % (ref 11.5–15.5)
WBC: 6.3 10*3/uL (ref 4.0–10.5)
nRBC: 0 % (ref 0.0–0.2)

## 2021-09-10 ENCOUNTER — Encounter: Payer: Self-pay | Admitting: Hematology

## 2021-10-03 ENCOUNTER — Inpatient Hospital Stay: Payer: BC Managed Care – PPO | Attending: Hematology

## 2021-10-24 ENCOUNTER — Other Ambulatory Visit: Payer: Self-pay | Admitting: Hematology

## 2021-10-24 DIAGNOSIS — I2782 Chronic pulmonary embolism: Secondary | ICD-10-CM

## 2021-10-31 ENCOUNTER — Other Ambulatory Visit: Payer: Self-pay

## 2021-10-31 ENCOUNTER — Inpatient Hospital Stay: Payer: BC Managed Care – PPO | Attending: Hematology

## 2021-10-31 DIAGNOSIS — Z7901 Long term (current) use of anticoagulants: Secondary | ICD-10-CM | POA: Insufficient documentation

## 2021-10-31 DIAGNOSIS — R791 Abnormal coagulation profile: Secondary | ICD-10-CM

## 2021-10-31 DIAGNOSIS — Z86718 Personal history of other venous thrombosis and embolism: Secondary | ICD-10-CM | POA: Insufficient documentation

## 2021-10-31 DIAGNOSIS — Z86711 Personal history of pulmonary embolism: Secondary | ICD-10-CM | POA: Insufficient documentation

## 2021-10-31 DIAGNOSIS — I82409 Acute embolism and thrombosis of unspecified deep veins of unspecified lower extremity: Secondary | ICD-10-CM

## 2021-10-31 LAB — CBC WITH DIFFERENTIAL/PLATELET
Abs Immature Granulocytes: 0.01 10*3/uL (ref 0.00–0.07)
Basophils Absolute: 0 10*3/uL (ref 0.0–0.1)
Basophils Relative: 0 %
Eosinophils Absolute: 0 10*3/uL (ref 0.0–0.5)
Eosinophils Relative: 1 %
HCT: 39.5 % (ref 36.0–46.0)
Hemoglobin: 13 g/dL (ref 12.0–15.0)
Immature Granulocytes: 0 %
Lymphocytes Relative: 29 %
Lymphs Abs: 1.4 10*3/uL (ref 0.7–4.0)
MCH: 29.5 pg (ref 26.0–34.0)
MCHC: 32.9 g/dL (ref 30.0–36.0)
MCV: 89.6 fL (ref 80.0–100.0)
Monocytes Absolute: 0.5 10*3/uL (ref 0.1–1.0)
Monocytes Relative: 11 %
Neutro Abs: 2.9 10*3/uL (ref 1.7–7.7)
Neutrophils Relative %: 59 %
Platelets: 200 10*3/uL (ref 150–400)
RBC: 4.41 MIL/uL (ref 3.87–5.11)
RDW: 12.7 % (ref 11.5–15.5)
WBC: 4.9 10*3/uL (ref 4.0–10.5)
nRBC: 0 % (ref 0.0–0.2)

## 2021-10-31 LAB — COMPREHENSIVE METABOLIC PANEL
ALT: 12 U/L (ref 0–44)
AST: 15 U/L (ref 15–41)
Albumin: 3.8 g/dL (ref 3.5–5.0)
Alkaline Phosphatase: 100 U/L (ref 38–126)
Anion gap: 6 (ref 5–15)
BUN: 15 mg/dL (ref 6–20)
CO2: 27 mmol/L (ref 22–32)
Calcium: 8.9 mg/dL (ref 8.9–10.3)
Chloride: 107 mmol/L (ref 98–111)
Creatinine, Ser: 0.85 mg/dL (ref 0.44–1.00)
GFR, Estimated: >60 mL/min (ref >60)
Glucose, Bld: 110 mg/dL — ABNORMAL HIGH (ref 70–99)
Potassium: 4.2 mmol/L (ref 3.5–5.1)
Sodium: 140 mmol/L (ref 135–145)
Total Bilirubin: 0.3 mg/dL (ref 0.3–1.2)
Total Protein: 7.8 g/dL (ref 6.5–8.1)

## 2021-10-31 LAB — PROTIME-INR
INR: 2.9 — ABNORMAL HIGH (ref 0.8–1.2)
Prothrombin Time: 30.4 seconds — ABNORMAL HIGH (ref 11.4–15.2)

## 2021-11-30 ENCOUNTER — Inpatient Hospital Stay: Payer: BC Managed Care – PPO | Attending: Hematology

## 2021-11-30 ENCOUNTER — Other Ambulatory Visit: Payer: Self-pay

## 2021-11-30 DIAGNOSIS — Z86711 Personal history of pulmonary embolism: Secondary | ICD-10-CM | POA: Insufficient documentation

## 2021-11-30 DIAGNOSIS — Z7901 Long term (current) use of anticoagulants: Secondary | ICD-10-CM | POA: Insufficient documentation

## 2021-11-30 DIAGNOSIS — R791 Abnormal coagulation profile: Secondary | ICD-10-CM

## 2021-11-30 DIAGNOSIS — Z86718 Personal history of other venous thrombosis and embolism: Secondary | ICD-10-CM | POA: Insufficient documentation

## 2021-11-30 DIAGNOSIS — I82409 Acute embolism and thrombosis of unspecified deep veins of unspecified lower extremity: Secondary | ICD-10-CM

## 2021-11-30 LAB — CBC WITH DIFFERENTIAL/PLATELET
Abs Immature Granulocytes: 0.01 10*3/uL (ref 0.00–0.07)
Basophils Absolute: 0 10*3/uL (ref 0.0–0.1)
Basophils Relative: 1 %
Eosinophils Absolute: 0 10*3/uL (ref 0.0–0.5)
Eosinophils Relative: 1 %
HCT: 39.9 % (ref 36.0–46.0)
Hemoglobin: 12.9 g/dL (ref 12.0–15.0)
Immature Granulocytes: 0 %
Lymphocytes Relative: 30 %
Lymphs Abs: 1.3 10*3/uL (ref 0.7–4.0)
MCH: 28.9 pg (ref 26.0–34.0)
MCHC: 32.3 g/dL (ref 30.0–36.0)
MCV: 89.3 fL (ref 80.0–100.0)
Monocytes Absolute: 0.4 10*3/uL (ref 0.1–1.0)
Monocytes Relative: 9 %
Neutro Abs: 2.6 10*3/uL (ref 1.7–7.7)
Neutrophils Relative %: 59 %
Platelets: 231 10*3/uL (ref 150–400)
RBC: 4.47 MIL/uL (ref 3.87–5.11)
RDW: 13 % (ref 11.5–15.5)
WBC: 4.3 10*3/uL (ref 4.0–10.5)
nRBC: 0 % (ref 0.0–0.2)

## 2021-11-30 LAB — CMP (CANCER CENTER ONLY)
ALT: 16 U/L (ref 0–44)
AST: 20 U/L (ref 15–41)
Albumin: 3.9 g/dL (ref 3.5–5.0)
Alkaline Phosphatase: 89 U/L (ref 38–126)
Anion gap: 5 (ref 5–15)
BUN: 14 mg/dL (ref 6–20)
CO2: 27 mmol/L (ref 22–32)
Calcium: 8.6 mg/dL — ABNORMAL LOW (ref 8.9–10.3)
Chloride: 107 mmol/L (ref 98–111)
Creatinine: 0.81 mg/dL (ref 0.44–1.00)
GFR, Estimated: >60 mL/min (ref >60)
Glucose, Bld: 93 mg/dL (ref 70–99)
Potassium: 3.9 mmol/L (ref 3.5–5.1)
Sodium: 139 mmol/L (ref 135–145)
Total Bilirubin: 0.6 mg/dL (ref 0.3–1.2)
Total Protein: 8.1 g/dL (ref 6.5–8.1)

## 2021-11-30 LAB — PROTIME-INR
INR: 2.3 — ABNORMAL HIGH (ref 0.8–1.2)
Prothrombin Time: 24.9 seconds — ABNORMAL HIGH (ref 11.4–15.2)

## 2021-12-18 ENCOUNTER — Encounter: Payer: Self-pay | Admitting: Emergency Medicine

## 2021-12-18 ENCOUNTER — Emergency Department: Payer: BC Managed Care – PPO

## 2021-12-18 DIAGNOSIS — Z79899 Other long term (current) drug therapy: Secondary | ICD-10-CM | POA: Diagnosis not present

## 2021-12-18 DIAGNOSIS — E119 Type 2 diabetes mellitus without complications: Secondary | ICD-10-CM | POA: Diagnosis not present

## 2021-12-18 DIAGNOSIS — Z7901 Long term (current) use of anticoagulants: Secondary | ICD-10-CM | POA: Diagnosis not present

## 2021-12-18 DIAGNOSIS — I10 Essential (primary) hypertension: Secondary | ICD-10-CM | POA: Diagnosis not present

## 2021-12-18 DIAGNOSIS — Z7984 Long term (current) use of oral hypoglycemic drugs: Secondary | ICD-10-CM | POA: Insufficient documentation

## 2021-12-18 DIAGNOSIS — R072 Precordial pain: Secondary | ICD-10-CM | POA: Diagnosis present

## 2021-12-18 DIAGNOSIS — R091 Pleurisy: Secondary | ICD-10-CM | POA: Diagnosis not present

## 2021-12-18 LAB — PROTIME-INR
INR: 1.9 — ABNORMAL HIGH (ref 0.8–1.2)
Prothrombin Time: 21.3 seconds — ABNORMAL HIGH (ref 11.4–15.2)

## 2021-12-18 LAB — CBC
HCT: 41.2 % (ref 36.0–46.0)
Hemoglobin: 12.8 g/dL (ref 12.0–15.0)
MCH: 28.3 pg (ref 26.0–34.0)
MCHC: 31.1 g/dL (ref 30.0–36.0)
MCV: 91.2 fL (ref 80.0–100.0)
Platelets: 240 10*3/uL (ref 150–400)
RBC: 4.52 MIL/uL (ref 3.87–5.11)
RDW: 13.2 % (ref 11.5–15.5)
WBC: 5.9 10*3/uL (ref 4.0–10.5)
nRBC: 0 % (ref 0.0–0.2)

## 2021-12-18 LAB — TROPONIN I (HIGH SENSITIVITY): Troponin I (High Sensitivity): <2 ng/L (ref <18)

## 2021-12-18 LAB — BASIC METABOLIC PANEL
Anion gap: 9 (ref 5–15)
BUN: 16 mg/dL (ref 6–20)
CO2: 24 mmol/L (ref 22–32)
Calcium: 8.9 mg/dL (ref 8.9–10.3)
Chloride: 108 mmol/L (ref 98–111)
Creatinine, Ser: 0.93 mg/dL (ref 0.44–1.00)
GFR, Estimated: >60 mL/min (ref >60)
Glucose, Bld: 92 mg/dL (ref 70–99)
Potassium: 3.8 mmol/L (ref 3.5–5.1)
Sodium: 141 mmol/L (ref 135–145)

## 2021-12-18 LAB — D-DIMER, QUANTITATIVE: D-Dimer, Quant: <0.27 ug/mL-FEU (ref 0.00–0.50)

## 2021-12-18 NOTE — ED Triage Notes (Addendum)
Pt presents via POV with complaints of CP that started 5 days ago - she notes that the pain is mid-sternal without radiation. She states she had a cold a few weeks ago and since then she has had mild CP but tonight was more significant. HX of PEs on Warfarin. Denies SOB, N/V.

## 2021-12-19 ENCOUNTER — Emergency Department: Payer: BC Managed Care – PPO

## 2021-12-19 ENCOUNTER — Emergency Department
Admission: EM | Admit: 2021-12-19 | Discharge: 2021-12-19 | Disposition: A | Discharge status: Home / Self Care | Payer: BC Managed Care – PPO | Attending: Emergency Medicine | Admitting: Emergency Medicine

## 2021-12-19 DIAGNOSIS — R091 Pleurisy: Secondary | ICD-10-CM

## 2021-12-19 DIAGNOSIS — R079 Chest pain, unspecified: Secondary | ICD-10-CM

## 2021-12-19 LAB — HEPATIC FUNCTION PANEL
ALT: 14 U/L (ref 0–44)
AST: 19 U/L (ref 15–41)
Albumin: 3.8 g/dL (ref 3.5–5.0)
Alkaline Phosphatase: 85 U/L (ref 38–126)
Bilirubin, Direct: <0.1 mg/dL (ref 0.0–0.2)
Total Bilirubin: 0.5 mg/dL (ref 0.3–1.2)
Total Protein: 7.9 g/dL (ref 6.5–8.1)

## 2021-12-19 LAB — LIPASE, BLOOD: Lipase: 45 U/L (ref 11–51)

## 2021-12-19 LAB — TROPONIN I (HIGH SENSITIVITY): Troponin I (High Sensitivity): 3 ng/L (ref <18)

## 2021-12-19 MED ORDER — SODIUM CHLORIDE 0.9 % IV BOLUS
1000.0000 mL | Freq: Once | INTRAVENOUS | Status: AC
  Administered 2021-12-19: 1000 mL via INTRAVENOUS

## 2021-12-19 MED ORDER — IOHEXOL 350 MG/ML SOLN
75.0000 mL | Freq: Once | INTRAVENOUS | Status: AC | PRN
  Administered 2021-12-19: 75 mL via INTRAVENOUS

## 2021-12-19 MED ORDER — HYDROCODONE-ACETAMINOPHEN 5-325 MG PO TABS: 1.0000 | ORAL_TABLET | Freq: Four times a day (QID) | ORAL | 0 refills | Status: DC | PRN

## 2021-12-19 MED ORDER — KETOROLAC TROMETHAMINE 30 MG/ML IJ SOLN
15.0000 mg | Freq: Once | INTRAMUSCULAR | Status: AC
  Administered 2021-12-19: 15 mg via INTRAVENOUS
  Filled 2021-12-19: qty 1

## 2021-12-19 NOTE — ED Provider Notes (Signed)
[facility] Provider Note    Event Date/Time   First MD Initiated Contact with Patient 12/19/21 0131     (approximate)   History   Chest Pain   HPI  Diva Lemberger is a 56 y.o. female who presents to the ED from home with a chief complaint of chest pain which began 5 days ago.  Pain is midsternal without radiation.  Patient had recent cold-like symptoms with coughing and congestion.  Denies fever, shortness of breath, abdominal pain, nausea, vomiting or dizziness.  Denies recent travel, trauma or hormone use.  Of note, patient takes warfarin for PEs diagnosed 20 years ago.  States they never found the reason why she had PEs     Past Medical History   Past Medical History:  Diagnosis Date   Diabetes mellitus without complication ([place])    Hyperlipidemia    Hypertension    Pulmonary embolism, blood-clot, obstetric 2002   on birth control-pulm blood clot-treated with coumadin-no problems now     Active Problem List   Patient Active Problem List   Diagnosis Date Noted   Obesity (BMI 35.0-39.9 without comorbidity) 06/24/2017   Personal history of venous thrombosis and embolism 04/19/2016   Abnormal weight gain 08/29/2012   DVT (deep venous thrombosis) ([place]) 06/08/2012   SOB (shortness of breath) 06/07/2012   Pulmonary embolism, bilateral ([place]) 06/07/2012   Pulmonary embolism 10 yrs ago 06/07/2012   Abnormal uterine bleeding 07/14/2011   Contraceptive management 04/21/2011     Past Surgical History   Past Surgical History:  Procedure Laterality Date   DILATATION & CURETTAGE/HYSTEROSCOPY WITH MYOSURE N/A 01/10/2019   Procedure: DILATATION & CURETTAGE/HYSTEROSCOPY WITH MYOSURE;  Surgeon: Benjaman Kindler, MD;  Location: [facility];  Service: Gynecology;  Laterality: N/A;   HYSTEROSCOPY WITH D & C N/A 01/10/2019   Procedure: DILATATION AND CURETTAGE /HYSTEROSCOPY;  Surgeon: Benjaman Kindler, MD;  Location: [facility];  Service: Gynecology;  Laterality:  N/A;   IUD REMOVAL  OCT 2012   LAPAROSCOPIC TUBAL LIGATION  07/14/2011   Procedure: LAPAROSCOPIC TUBAL LIGATION;  Surgeon: Agnes Lawrence, MD;  Location: [facility];  Service: Gynecology;  Laterality: N/A;   NO PAST SURGERIES     TUBAL LIGATION  04/21/2011   Procedure: ESSURE TUBAL STERILIZATION;  Surgeon: Agnes Lawrence, MD;  Location: [facility];  Service: Gynecology;  Laterality: Bilateral;   attempted essure,removal of IUD, hysteroscopy,         dilatation and currettage   vaginal ablasion       Home Medications   Prior to Admission medications   Medication Sig Start Date End Date Taking? Authorizing Provider  acetaminophen (TYLENOL) 500 MG tablet Take by mouth as needed.    [provider]  lisinopril (ZESTRIL) 5 MG tablet Take 5 mg by mouth daily. 10/15/18   [provider]  lovastatin (MEVACOR) 20 MG tablet Take 20 mg by mouth at bedtime. 09/27/18   [provider]  metFORMIN (GLUCOPHAGE) 500 MG tablet Take 500 mg by mouth daily with breakfast.     [provider]  naproxen sodium (ANAPROX) 220 MG tablet Take 220 mg by mouth 2 (two) times daily as needed (for pain). Reported on 10/07/2015 Patient not taking: Reported on 04/30/2020    [provider]  warfarin (COUMADIN) 10 MG tablet TAKE 1 TABLET BY MOUTH EVERY DAY 06/03/20   Truitt Merle, MD  warfarin (COUMADIN) 7.5 MG tablet TAKE 1 TABLET (7.5 MG TOTAL) BY MOUTH DAILY AT 6 PM.  10/24/21   Truitt Merle, MD  Rivaroxaban (XARELTO) 15 MG TABS tablet Take 1 tablet (15 mg total) by mouth 2 (two) times daily with a meal. 05/17/16 05/19/16  Merlyn Lot, MD     Allergies  Sulfa antibiotics   Family History   Family History  Problem Relation Age of Onset   Diabetes Mother    Diabetes Father    Diabetes Sister      Physical Exam  Triage Vital Signs: ED Triage Vitals  Enc Vitals Group     BP 12/18/21 2117 (!) 133/52     Pulse Rate 12/18/21 2117 72     Resp 12/18/21 2117 18     Temp  12/18/21 2117 98.3 F (36.8 C)     Temp src --      SpO2 12/18/21 2117 100 %     Weight 12/18/21 2115 205 lb (93 kg)     Height 12/18/21 2115 '5\' 8"'$  (1.727 m)     Head Circumference --      Peak Flow --      Pain Score 12/18/21 2113 6     Pain Loc --      Pain Edu? --      Excl. in [place]? --     Updated Vital Signs: BP 121/66 (BP Location: Left Arm)   Pulse 78   Temp 98.3 F (36.8 C)   Resp 16   Ht '5\' 8"'$  (1.727 m)   Wt 93 kg   SpO2 99%   BMI 31.17 kg/m    General: Awake, no distress.  CV:  RRR.  Good peripheral perfusion.  Resp:  Normal effort.  CTA B. Abd:  Nontender.  No distention.  Other:  Calves are nonswollen and nontender bilaterally.   ED Results / Procedures / Treatments  Labs (all labs ordered are listed, but only abnormal results are displayed) Labs Reviewed  PROTIME-INR - Abnormal; Notable for the following components:      Result Value   Prothrombin Time 21.3 (*)    INR 1.9 (*)    All other components within normal limits  BASIC METABOLIC PANEL  CBC  D-DIMER, QUANTITATIVE  HEPATIC FUNCTION PANEL  LIPASE, BLOOD  TROPONIN I (HIGH SENSITIVITY)  TROPONIN I (HIGH SENSITIVITY)     EKG  ED ECG REPORT I, Kaloni Bisaillon J, the attending physician, personally viewed and interpreted this ECG.   Date: 12/19/2021  EKG Time: 2115  Rate: 75  Rhythm: normal sinus rhythm  Axis: Normal  Intervals:none  ST&T Change: Nonspecific    RADIOLOGY I have independently visualized and interpreted patient's chest x-ray and CTA chest as well as noted the radiology interpretation:  Chest xray: No acute cardiopulmonary process  CTA chest: No PE  Official radiology report(s): DG Chest 2 View  Result Date: 12/18/2021 CLINICAL DATA:  Chest pain. EXAM: CHEST - 2 VIEW COMPARISON:  Chest radiograph dated 05/23/2016. FINDINGS: No focal consolidation, pleural effusion or pneumothorax. The cardiac silhouette is within limits. Atherosclerotic calcification of the aorta. No  acute osseous pathology. IMPRESSION: No active cardiopulmonary disease. Electronically Signed   By: Anner Crete M.D.   On: 12/18/2021 21:36   CT Angio Chest PE W/Cm &/Or Wo Cm  Result Date: 12/19/2021 CLINICAL DATA:  Concern for pulmonary embolism. EXAM: CT ANGIOGRAPHY CHEST WITH CONTRAST TECHNIQUE: Multidetector CT imaging of the chest was performed using the standard protocol during bolus administration of intravenous contrast. Multiplanar CT image reconstructions and MIPs were obtained to evaluate the vascular anatomy. RADIATION DOSE  REDUCTION: This exam was performed according to the departmental dose-optimization program which includes automated exposure control, adjustment of the mA and/or kV according to patient size and/or use of iterative reconstruction technique. CONTRAST:  5m OMNIPAQUE IOHEXOL 350 MG/ML SOLN COMPARISON:  Chest CT dated 05/23/2016 and radiograph dated 12/18/2021. FINDINGS: Cardiovascular: There is no cardiomegaly or pericardial effusion. Mild atherosclerotic calcification of the thoracic aorta. No aneurysmal dilatation. No pulmonary artery embolus identified. Mediastinum/Nodes: No hilar or mediastinal adenopathy. The esophagus is grossly unremarkable. No mediastinal fluid collection. Lungs/Pleura: Minimal bibasilar dependent subpleural atelectasis. No focal consolidation, pleural effusion, or pneumothorax. The central airways are patent. Upper Abdomen: No acute abnormality. Musculoskeletal: No acute osseous pathology. Review of the MIP images confirms the above findings. IMPRESSION: 1. No acute intrathoracic pathology. No CT evidence of pulmonary artery embolus. 2. Aortic Atherosclerosis (ICD10-I70.0). Electronically Signed   By: AAnner CreteM.D.   On: 12/19/2021 02:50     PROCEDURES:  Critical Care performed: No  Procedures   MEDICATIONS ORDERED IN ED: Medications  sodium chloride 0.9 % bolus 1,000 mL (0 mLs Intravenous Stopped 12/19/21 0308)  ketorolac  (TORADOL) 30 MG/ML injection 15 mg (15 mg Intravenous Given 12/19/21 0203)  iohexol (OMNIPAQUE) 350 MG/ML injection 75 mL (75 mLs Intravenous Contrast Given 12/19/21 0215)     IMPRESSION / MDM / ASSESSMENT AND PLAN / ED COURSE  I reviewed the triage vital signs and the nursing notes.                             56year old female presenting with central chest discomfort.  I have identified this patient to have a potentially life-threatening condition. Differential diagnosis includes, but is not limited to, ACS, aortic dissection, pulmonary embolism, cardiac tamponade, pneumothorax, pneumonia, pericarditis, myocarditis, GI-related causes including esophagitis/gastritis, and musculoskeletal chest wall pain.     I have personally reviewed patient's records and see that her PCP called in a prescription for Z-Pak on 12/16/2021 for her URI symptoms  Laboratory results demonstrate normal WBC, normal electrolytes, negative troponins x2.  INR is 1.9.  X-ray unremarkable.  Will check LFTs/lipase and proceed with CTA chest to evaluate for PE.  Administer single, low-dose IV ketorolac for discomfort.  Clinical Course as of 12/19/21 0317  Mon Dec 19, 2021  0311 Patient feeling better.  CTA negative for PE.  Discussed with patient risk/benefits of placing her on Medrol Dosepak.  We will hold for now as I do not wish to increase her bleeding risk as she is on anticoagulation.  Will discharge home with as needed prescription for Norco and she will follow-up closely with her PCP.  Strict return precautions given.  Patient and spouse verbalized understanding agree with plan of care. [JS]    Clinical Course User Index [JS] SPaulette Blanch MD     FINAL CLINICAL IMPRESSION(S) / ED DIAGNOSES   Final diagnoses:  Nonspecific chest pain  Pleurisy     Rx / DC Orders   ED Discharge Orders     None        Note:  This document was prepared using Dragon voice recognition software and may include unintentional  dictation errors.   SPaulette Blanch MD 12/19/21 [id number]

## 2021-12-19 NOTE — Discharge Instructions (Signed)
You may take Norco as needed for pain.  Apply moist heat to affected area several times daily to relieve discomfort.  Return to the ER for worsening symptoms, persistent vomiting, difficulty breathing or other concerns

## 2022-01-02 ENCOUNTER — Inpatient Hospital Stay: Payer: BC Managed Care – PPO | Attending: Hematology

## 2022-01-30 ENCOUNTER — Other Ambulatory Visit: Payer: Self-pay

## 2022-01-30 ENCOUNTER — Inpatient Hospital Stay: Payer: BC Managed Care – PPO | Attending: Hematology

## 2022-01-30 DIAGNOSIS — R791 Abnormal coagulation profile: Secondary | ICD-10-CM | POA: Insufficient documentation

## 2022-01-30 DIAGNOSIS — R971 Elevated cancer antigen 125 [CA 125]: Secondary | ICD-10-CM | POA: Diagnosis present

## 2022-01-30 DIAGNOSIS — I82409 Acute embolism and thrombosis of unspecified deep veins of unspecified lower extremity: Secondary | ICD-10-CM | POA: Insufficient documentation

## 2022-01-30 LAB — CBC WITH DIFFERENTIAL/PLATELET
Abs Immature Granulocytes: 0.01 10*3/uL (ref 0.00–0.07)
Basophils Absolute: 0 10*3/uL (ref 0.0–0.1)
Basophils Relative: 1 %
Eosinophils Absolute: 0 10*3/uL (ref 0.0–0.5)
Eosinophils Relative: 1 %
HCT: 39.4 % (ref 36.0–46.0)
Hemoglobin: 12.9 g/dL (ref 12.0–15.0)
Immature Granulocytes: 0 %
Lymphocytes Relative: 33 %
Lymphs Abs: 1.8 10*3/uL (ref 0.7–4.0)
MCH: 29 pg (ref 26.0–34.0)
MCHC: 32.7 g/dL (ref 30.0–36.0)
MCV: 88.5 fL (ref 80.0–100.0)
Monocytes Absolute: 0.5 10*3/uL (ref 0.1–1.0)
Monocytes Relative: 9 %
Neutro Abs: 3 10*3/uL (ref 1.7–7.7)
Neutrophils Relative %: 56 %
Platelets: 227 10*3/uL (ref 150–400)
RBC: 4.45 MIL/uL (ref 3.87–5.11)
RDW: 13.8 % (ref 11.5–15.5)
WBC: 5.3 10*3/uL (ref 4.0–10.5)
nRBC: 0 % (ref 0.0–0.2)

## 2022-01-30 LAB — COMPREHENSIVE METABOLIC PANEL
ALT: 12 U/L (ref 0–44)
AST: 13 U/L — ABNORMAL LOW (ref 15–41)
Albumin: 3.9 g/dL (ref 3.5–5.0)
Alkaline Phosphatase: 92 U/L (ref 38–126)
Anion gap: 7 (ref 5–15)
BUN: 17 mg/dL (ref 6–20)
CO2: 26 mmol/L (ref 22–32)
Calcium: 9.1 mg/dL (ref 8.9–10.3)
Chloride: 107 mmol/L (ref 98–111)
Creatinine, Ser: 0.86 mg/dL (ref 0.44–1.00)
GFR, Estimated: >60 mL/min (ref >60)
Glucose, Bld: 98 mg/dL (ref 70–99)
Potassium: 3.9 mmol/L (ref 3.5–5.1)
Sodium: 140 mmol/L (ref 135–145)
Total Bilirubin: 0.4 mg/dL (ref 0.3–1.2)
Total Protein: 7.5 g/dL (ref 6.5–8.1)

## 2022-01-30 LAB — PROTIME-INR
INR: 2.7 — ABNORMAL HIGH (ref 0.8–1.2)
Prothrombin Time: 28.8 seconds — ABNORMAL HIGH (ref 11.4–15.2)

## 2022-03-01 ENCOUNTER — Inpatient Hospital Stay: Payer: BC Managed Care – PPO | Attending: Hematology

## 2022-03-23 ENCOUNTER — Telehealth: Payer: Self-pay | Admitting: Hematology

## 2022-03-23 NOTE — Telephone Encounter (Signed)
Contacted patient about rescheduling appointment, because she was scheduled a month early. Patient is aware of new appointment time.

## 2022-04-05 ENCOUNTER — Inpatient Hospital Stay: Payer: BC Managed Care – PPO | Admitting: Hematology

## 2022-04-05 ENCOUNTER — Inpatient Hospital Stay: Payer: BC Managed Care – PPO

## 2022-04-13 ENCOUNTER — Other Ambulatory Visit: Payer: Self-pay | Admitting: Family Medicine

## 2022-04-13 ENCOUNTER — Ambulatory Visit
Admission: RE | Admit: 2022-04-13 | Discharge: 2022-04-13 | Disposition: A | Discharge status: Home / Self Care | Payer: No Typology Code available for payment source | Source: Ambulatory Visit | Attending: Family Medicine | Admitting: Family Medicine

## 2022-04-13 DIAGNOSIS — W1800XA Striking against unspecified object with subsequent fall, initial encounter: Secondary | ICD-10-CM

## 2022-04-13 DIAGNOSIS — G44319 Acute post-traumatic headache, not intractable: Secondary | ICD-10-CM | POA: Insufficient documentation

## 2022-04-13 DIAGNOSIS — M542 Cervicalgia: Secondary | ICD-10-CM

## 2022-04-13 DIAGNOSIS — R200 Anesthesia of skin: Secondary | ICD-10-CM | POA: Diagnosis not present

## 2022-04-13 DIAGNOSIS — S0993XA Unspecified injury of face, initial encounter: Secondary | ICD-10-CM | POA: Diagnosis present

## 2022-04-13 DIAGNOSIS — M545 Low back pain, unspecified: Secondary | ICD-10-CM | POA: Diagnosis not present

## 2022-04-13 DIAGNOSIS — Y92009 Unspecified place in unspecified non-institutional (private) residence as the place of occurrence of the external cause: Secondary | ICD-10-CM

## 2022-04-13 DIAGNOSIS — W19XXXD Unspecified fall, subsequent encounter: Secondary | ICD-10-CM | POA: Insufficient documentation

## 2022-04-14 DIAGNOSIS — S065XAA Traumatic subdural hemorrhage with loss of consciousness status unknown, initial encounter: Principal | ICD-10-CM | POA: Diagnosis present

## 2022-04-18 ENCOUNTER — Other Ambulatory Visit: Payer: Self-pay | Admitting: Hematology

## 2022-04-18 DIAGNOSIS — I2782 Chronic pulmonary embolism: Secondary | ICD-10-CM

## 2022-05-04 ENCOUNTER — Inpatient Hospital Stay: Payer: BC Managed Care – PPO | Admitting: Hematology

## 2022-05-04 ENCOUNTER — Inpatient Hospital Stay: Payer: BC Managed Care – PPO | Attending: Hematology

## 2022-05-04 DIAGNOSIS — Z86718 Personal history of other venous thrombosis and embolism: Secondary | ICD-10-CM

## 2022-05-10 ENCOUNTER — Emergency Department: Payer: BC Managed Care – PPO

## 2022-05-10 ENCOUNTER — Other Ambulatory Visit: Payer: Self-pay

## 2022-05-10 ENCOUNTER — Inpatient Hospital Stay
Admission: EM | Admit: 2022-05-10 | Discharge: 2022-05-13 | DRG: 981 | Disposition: A | Discharge status: Home / Self Care | Payer: BC Managed Care – PPO | Attending: Family Medicine | Admitting: Family Medicine

## 2022-05-10 DIAGNOSIS — Z7984 Long term (current) use of oral hypoglycemic drugs: Secondary | ICD-10-CM

## 2022-05-10 DIAGNOSIS — Z86718 Personal history of other venous thrombosis and embolism: Secondary | ICD-10-CM

## 2022-05-10 DIAGNOSIS — E1169 Type 2 diabetes mellitus with other specified complication: Secondary | ICD-10-CM | POA: Diagnosis present

## 2022-05-10 DIAGNOSIS — D6832 Hemorrhagic disorder due to extrinsic circulating anticoagulants: Secondary | ICD-10-CM | POA: Diagnosis present

## 2022-05-10 DIAGNOSIS — Z833 Family history of diabetes mellitus: Secondary | ICD-10-CM | POA: Diagnosis not present

## 2022-05-10 DIAGNOSIS — I10 Essential (primary) hypertension: Secondary | ICD-10-CM | POA: Diagnosis present

## 2022-05-10 DIAGNOSIS — R471 Dysarthria and anarthria: Secondary | ICD-10-CM | POA: Diagnosis present

## 2022-05-10 DIAGNOSIS — R4182 Altered mental status, unspecified: Secondary | ICD-10-CM | POA: Diagnosis present

## 2022-05-10 DIAGNOSIS — D649 Anemia, unspecified: Secondary | ICD-10-CM | POA: Diagnosis present

## 2022-05-10 DIAGNOSIS — E785 Hyperlipidemia, unspecified: Secondary | ICD-10-CM | POA: Diagnosis present

## 2022-05-10 DIAGNOSIS — S065XAD Traumatic subdural hemorrhage with loss of consciousness status unknown, subsequent encounter: Secondary | ICD-10-CM

## 2022-05-10 DIAGNOSIS — Z86711 Personal history of pulmonary embolism: Secondary | ICD-10-CM | POA: Diagnosis not present

## 2022-05-10 DIAGNOSIS — I2699 Other pulmonary embolism without acute cor pulmonale: Secondary | ICD-10-CM | POA: Diagnosis not present

## 2022-05-10 DIAGNOSIS — T45515A Adverse effect of anticoagulants, initial encounter: Secondary | ICD-10-CM | POA: Diagnosis present

## 2022-05-10 DIAGNOSIS — Z79899 Other long term (current) drug therapy: Secondary | ICD-10-CM | POA: Diagnosis not present

## 2022-05-10 DIAGNOSIS — Z1152 Encounter for screening for COVID-19: Secondary | ICD-10-CM | POA: Diagnosis not present

## 2022-05-10 DIAGNOSIS — S065XAA Traumatic subdural hemorrhage with loss of consciousness status unknown, initial encounter: Secondary | ICD-10-CM | POA: Diagnosis not present

## 2022-05-10 DIAGNOSIS — Z7901 Long term (current) use of anticoagulants: Secondary | ICD-10-CM | POA: Diagnosis not present

## 2022-05-10 DIAGNOSIS — Z882 Allergy status to sulfonamides status: Secondary | ICD-10-CM

## 2022-05-10 DIAGNOSIS — S065X0A Traumatic subdural hemorrhage without loss of consciousness, initial encounter: Secondary | ICD-10-CM | POA: Diagnosis not present

## 2022-05-10 DIAGNOSIS — W19XXXD Unspecified fall, subsequent encounter: Secondary | ICD-10-CM | POA: Diagnosis present

## 2022-05-10 DIAGNOSIS — R4781 Slurred speech: Secondary | ICD-10-CM | POA: Diagnosis present

## 2022-05-10 DIAGNOSIS — G935 Compression of brain: Secondary | ICD-10-CM | POA: Diagnosis present

## 2022-05-10 LAB — COMPREHENSIVE METABOLIC PANEL
ALT: 31 U/L (ref 0–44)
AST: 29 U/L (ref 15–41)
Albumin: 3 g/dL — ABNORMAL LOW (ref 3.5–5.0)
Alkaline Phosphatase: 71 U/L (ref 38–126)
Anion gap: 7 (ref 5–15)
BUN: 10 mg/dL (ref 6–20)
CO2: 26 mmol/L (ref 22–32)
Calcium: 8.7 mg/dL — ABNORMAL LOW (ref 8.9–10.3)
Chloride: 108 mmol/L (ref 98–111)
Creatinine, Ser: 0.57 mg/dL (ref 0.44–1.00)
GFR, Estimated: >60 mL/min (ref >60)
Glucose, Bld: 95 mg/dL (ref 70–99)
Potassium: 3.5 mmol/L (ref 3.5–5.1)
Sodium: 141 mmol/L (ref 135–145)
Total Bilirubin: 0.8 mg/dL (ref 0.3–1.2)
Total Protein: 6.2 g/dL — ABNORMAL LOW (ref 6.5–8.1)

## 2022-05-10 LAB — CBC WITH DIFFERENTIAL/PLATELET
Abs Immature Granulocytes: 0.01 10*3/uL (ref 0.00–0.07)
Abs Immature Granulocytes: 0.01 10*3/uL (ref 0.00–0.07)
Basophils Absolute: 0 10*3/uL (ref 0.0–0.1)
Basophils Absolute: 0 10*3/uL (ref 0.0–0.1)
Basophils Relative: 0 %
Basophils Relative: 1 %
Eosinophils Absolute: 0.5 10*3/uL (ref 0.0–0.5)
Eosinophils Absolute: 0.4 10*3/uL (ref 0.0–0.5)
Eosinophils Relative: 7 %
Eosinophils Relative: 8 %
HCT: 38.1 % (ref 36.0–46.0)
HCT: 31.5 % — ABNORMAL LOW (ref 36.0–46.0)
Hemoglobin: 11.8 g/dL — ABNORMAL LOW (ref 12.0–15.0)
Hemoglobin: 10.1 g/dL — ABNORMAL LOW (ref 12.0–15.0)
Immature Granulocytes: 0 %
Immature Granulocytes: 0 %
Lymphocytes Relative: 30 %
Lymphocytes Relative: 38 %
Lymphs Abs: 2.1 10*3/uL (ref 0.7–4.0)
Lymphs Abs: 2 10*3/uL (ref 0.7–4.0)
MCH: 28.1 pg (ref 26.0–34.0)
MCH: 28.2 pg (ref 26.0–34.0)
MCHC: 31 g/dL (ref 30.0–36.0)
MCHC: 32.1 g/dL (ref 30.0–36.0)
MCV: 90.7 fL (ref 80.0–100.0)
MCV: 88 fL (ref 80.0–100.0)
Monocytes Absolute: 0.5 10*3/uL (ref 0.1–1.0)
Monocytes Absolute: 0.4 10*3/uL (ref 0.1–1.0)
Monocytes Relative: 7 %
Monocytes Relative: 7 %
Neutro Abs: 3.9 10*3/uL (ref 1.7–7.7)
Neutro Abs: 2.4 10*3/uL (ref 1.7–7.7)
Neutrophils Relative %: 56 %
Neutrophils Relative %: 46 %
Platelets: 240 10*3/uL (ref 150–400)
Platelets: 227 10*3/uL (ref 150–400)
RBC: 4.2 MIL/uL (ref 3.87–5.11)
RBC: 3.58 MIL/uL — ABNORMAL LOW (ref 3.87–5.11)
RDW: 13.8 % (ref 11.5–15.5)
RDW: 13.9 % (ref 11.5–15.5)
WBC: 7 10*3/uL (ref 4.0–10.5)
WBC: 5.2 10*3/uL (ref 4.0–10.5)
nRBC: 0 % (ref 0.0–0.2)
nRBC: 0 % (ref 0.0–0.2)

## 2022-05-10 LAB — URINALYSIS, ROUTINE W REFLEX MICROSCOPIC
Bilirubin Urine: NEGATIVE
Glucose, UA: NEGATIVE mg/dL
Hgb urine dipstick: NEGATIVE
Ketones, ur: NEGATIVE mg/dL
Leukocytes,Ua: NEGATIVE
Nitrite: NEGATIVE
Protein, ur: NEGATIVE mg/dL
Specific Gravity, Urine: 1.01 (ref 1.005–1.030)
pH: 5 (ref 5.0–8.0)

## 2022-05-10 LAB — BASIC METABOLIC PANEL
Anion gap: 8 (ref 5–15)
BUN: 13 mg/dL (ref 6–20)
CO2: 26 mmol/L (ref 22–32)
Calcium: 9 mg/dL (ref 8.9–10.3)
Chloride: 107 mmol/L (ref 98–111)
Creatinine, Ser: 0.6 mg/dL (ref 0.44–1.00)
GFR, Estimated: >60 mL/min (ref >60)
Glucose, Bld: 93 mg/dL (ref 70–99)
Potassium: 3.7 mmol/L (ref 3.5–5.1)
Sodium: 141 mmol/L (ref 135–145)

## 2022-05-10 LAB — CBG MONITORING, ED: Glucose-Capillary: 70 mg/dL (ref 70–99)

## 2022-05-10 LAB — MAGNESIUM: Magnesium: 1.7 mg/dL (ref 1.7–2.4)

## 2022-05-10 LAB — PHOSPHORUS: Phosphorus: 4.5 mg/dL (ref 2.5–4.6)

## 2022-05-10 LAB — PROTIME-INR
INR: 1.2 (ref 0.8–1.2)
Prothrombin Time: 15 seconds (ref 11.4–15.2)

## 2022-05-10 MED ORDER — LEVETIRACETAM IN NACL 500 MG/100ML IV SOLN
500.0000 mg | Freq: Two times a day (BID) | INTRAVENOUS | Status: DC
  Administered 2022-05-10 – 2022-05-12 (×3): 500 mg via INTRAVENOUS
  Filled 2022-05-10 (×5): qty 100

## 2022-05-10 MED ORDER — ACETAMINOPHEN 650 MG RE SUPP: 650.0000 mg | RECTAL | Status: DC | PRN

## 2022-05-10 MED ORDER — PANTOPRAZOLE SODIUM 40 MG IV SOLR
40.0000 mg | Freq: Two times a day (BID) | INTRAVENOUS | Status: DC
  Administered 2022-05-10 – 2022-05-13 (×6): 40 mg via INTRAVENOUS
  Filled 2022-05-10 (×5): qty 10

## 2022-05-10 MED ORDER — PANTOPRAZOLE SODIUM 40 MG IV SOLR: 40.0000 mg | Freq: Every day | INTRAVENOUS | Status: DC

## 2022-05-10 MED ORDER — ACETAMINOPHEN 325 MG PO TABS
650.0000 mg | ORAL_TABLET | ORAL | Status: DC | PRN
  Administered 2022-05-11 – 2022-05-12 (×2): 650 mg via ORAL
  Filled 2022-05-10 (×2): qty 2

## 2022-05-10 MED ORDER — STROKE: EARLY STAGES OF RECOVERY BOOK: Freq: Once | Status: DC

## 2022-05-10 MED ORDER — ACETAMINOPHEN 160 MG/5ML PO SOLN: 650.0000 mg | ORAL | Status: DC | PRN

## 2022-05-10 MED ORDER — INSULIN ASPART 100 UNIT/ML IJ SOLN
0.0000 [IU] | Freq: Three times a day (TID) | INTRAMUSCULAR | Status: DC
  Administered 2022-05-12 – 2022-05-13 (×3): 1 [IU] via SUBCUTANEOUS
  Filled 2022-05-10 (×3): qty 1

## 2022-05-10 NOTE — ED Triage Notes (Signed)
Pt arrives with c/o AMS and slurred speech. LSN was 0700 this morning. Pt had brain surgery about 2 weeks ago. Pt takes eliquis. Pt was taken off of coumadin and replaced with eliquis about 3 weeks ago. Pt has expressive aphagia in triage.

## 2022-05-10 NOTE — H&P (Addendum)
History and Physical    Chief Complaint: AMS Slurred speech.   HISTORY OF PRESENT ILLNESS: Bianca Simmons is an 56 y.o. female  brought for AMS and slurred speech.  Patient was admitted on 04/20/2022 for headache after a fall 3 weeks prior found to have a left subdural hematoma, patient seen at [facility] for left MMA under supervision of Dr. Toribio Harbour. Patient was discharged on 04/22/2022 from [facility] after having the left MMA embolization.  Patient tolerated the procedure well she was extubated and was taken to PACU for neuro monitoring and eventually transferred to the floor. Postoperatively patient did well repeat CT did not demonstrate any new blood products and was restarted on Eliquis. Patient comes today for altered mental status and slurred speech that was noted by family members.  Patient restarted on Eliquis and last dose was yesterday about 24 hours ago. Neurologically patient's exam is nonfocal no facial droop no slurred speech on my assessment.  Family at bedside.  Stat CT imaging showed a left subdural hematoma that is redemonstrated with unchanged mass effect and 6 mm left to right midline shift. Neurosurgery was consulted by ED provider with plans to admit and neurosurgery tomorrow.   Pt has PMH as below: Past Medical History:  Diagnosis Date   Diabetes mellitus without complication ([place])    DVT (deep venous thrombosis) ([place]) 06/08/2012   Hyperlipidemia    Hypertension    Personal history of venous thrombosis and embolism 04/19/2016   Pulmonary embolism 10 yrs ago 06/07/2012   Pulmonary embolism, blood-clot, obstetric 2002   on birth control-pulm blood clot-treated with coumadin-no problems now   SOB (shortness of breath) 06/07/2012   Review of Systems  All other systems reviewed and are negative.  Allergies  Allergen Reactions   Sulfa Antibiotics Nausea Only    Past Surgical History:  Procedure Laterality Date   DILATATION & CURETTAGE/HYSTEROSCOPY WITH MYOSURE N/A 01/10/2019    Procedure: DILATATION & CURETTAGE/HYSTEROSCOPY WITH MYOSURE;  Surgeon: Benjaman Kindler, MD;  Location: [facility] ORS;  Service: Gynecology;  Laterality: N/A;   HYSTEROSCOPY WITH D & C N/A 01/10/2019   Procedure: DILATATION AND CURETTAGE /HYSTEROSCOPY;  Surgeon: Benjaman Kindler, MD;  Location: [facility] ORS;  Service: Gynecology;  Laterality: N/A;   IUD REMOVAL  OCT 2012   LAPAROSCOPIC TUBAL LIGATION  07/14/2011   Procedure: LAPAROSCOPIC TUBAL LIGATION;  Surgeon: Agnes Lawrence, MD;  Location: [place] [facility];  Service: Gynecology;  Laterality: N/A;   NO PAST SURGERIES     TUBAL LIGATION  04/21/2011   Procedure: ESSURE TUBAL STERILIZATION;  Surgeon: Agnes Lawrence, MD;  Location: [place] [facility];  Service: Gynecology;  Laterality: Bilateral;   attempted essure,removal of IUD, hysteroscopy,         dilatation and currettage   vaginal ablasion        Social History   Socioeconomic History   Marital status: Married    Spouse name: Not on file   Number of children: Not on file   Years of education: Not on file   Highest education level: Not on file  Occupational History   Not on file  Tobacco Use   Smoking status: Never   Smokeless tobacco: Never  Vaping Use   Vaping Use: Never used  Substance and Sexual Activity   Alcohol use: No   Drug use: No   Sexual activity: Not on file  Other Topics Concern   Not on file  Social History Narrative   Not on file   Social Determinants of Health  Financial Resource Strain: Not on file  Food Insecurity: Not on file  Transportation Needs: Not on file  Physical Activity: Not on file  Stress: Not on file  Social Connections: Not on file      CURRENT MEDS:  Current Facility-Administered Medications (Endocrine & Metabolic):    [START ON 57/84/6962] insulin aspart (novoLOG) injection 0-9 Units  Current Outpatient Medications (Endocrine & Metabolic):    metFORMIN (GLUCOPHAGE) 500 MG tablet, Take 500 mg by mouth daily with breakfast.    OZEMPIC, 1  MG/DOSE, 4 MG/3ML SOPN, Inject into the skin.   Current Outpatient Medications (Cardiovascular):    lisinopril (ZESTRIL) 5 MG tablet, Take 5 mg by mouth daily.   lovastatin (MEVACOR) 20 MG tablet, Take 20 mg by mouth at bedtime.    Current Facility-Administered Medications (Analgesics):    acetaminophen (TYLENOL) tablet 650 mg **OR** acetaminophen (TYLENOL) 160 MG/5ML solution 650 mg **OR** acetaminophen (TYLENOL) suppository 650 mg  Current Outpatient Medications (Analgesics):    acetaminophen (TYLENOL) 500 MG tablet, Take by mouth as needed.   HYDROcodone-acetaminophen (NORCO) 5-325 MG tablet, Take 1 tablet by mouth every 6 (six) hours as needed for moderate pain. (Patient not taking: Reported on 05/10/2022)   naproxen sodium (ANAPROX) 220 MG tablet, Take 220 mg by mouth 2 (two) times daily as needed (for pain). Reported on 10/07/2015   oxyCODONE (OXY IR/ROXICODONE) 5 MG immediate release tablet, Take by mouth. (Patient not taking: Reported on 05/10/2022)   Current Outpatient Medications (Hematological):    apixaban (ELIQUIS) 2.5 MG TABS tablet, Take 2.5 mg by mouth 2 (two) times daily.   warfarin (COUMADIN) 10 MG tablet, TAKE 1 TABLET BY MOUTH EVERY DAY (Patient not taking: Reported on 05/10/2022)   warfarin (COUMADIN) 7.5 MG tablet, TAKE 1 TABLET (7.5 MG TOTAL) BY MOUTH DAILY AT 6 PM. (Patient not taking: Reported on 05/10/2022)  Current Facility-Administered Medications (Other):    [START ON 05/11/2022]  stroke: early stages of recovery book   levETIRAcetam (KEPPRA) IVPB 500 mg/100 mL premix   pantoprazole (PROTONIX) injection 40 mg  Current Outpatient Medications (Other):    levETIRAcetam (KEPPRA) 500 MG tablet, Take 500 mg by mouth 2 (two) times daily.   ondansetron (ZOFRAN-ODT) 4 MG disintegrating tablet, Take 4 mg by mouth every 8 (eight) hours as needed.   tamsulosin (FLOMAX) 0.4 MG CAPS capsule, Take 0.8 mg by mouth daily. (Patient not taking: Reported on 05/10/2022)    ED  Course: Pt in Ed alert awake fatigued appearing blood pressure stable O2 sats 100% on room air. Vitals:   05/10/22 1700 05/10/22 1701 05/10/22 2003  BP: (!) 149/65  (!) 135/58  Pulse: 71  62  Resp: 18  18  Temp: 98.3 F (36.8 C)  98.1 F (36.7 C)  TempSrc: Oral  Oral  SpO2: 100%  100%  Weight:  93 kg    Total I/O In: 90.6 [IV Piggyback:90.6] Out: -  SpO2: 100 % Blood work in ed shows : New anemia with a hemoglobin of 11.8, normal CMP. Results for orders placed or performed during the hospital encounter of 05/10/22 (from the past 24 hour(s))  CBC with Differential     Status: Abnormal   Collection Time: 05/10/22  5:07 PM  Result Value Ref Range   WBC 7.0 4.0 - 10.5 K/uL   RBC 4.20 3.87 - 5.11 MIL/uL   Hemoglobin 11.8 (L) 12.0 - 15.0 g/dL   HCT 38.1 36.0 - 46.0 %   MCV 90.7 80.0 - 100.0 fL  MCH 28.1 26.0 - 34.0 pg   MCHC 31.0 30.0 - 36.0 g/dL   RDW 13.8 11.5 - 15.5 %   Platelets 240 150 - 400 K/uL   nRBC 0.0 0.0 - 0.2 %   Neutrophils Relative % 56 %   Neutro Abs 3.9 1.7 - 7.7 K/uL   Lymphocytes Relative 30 %   Lymphs Abs 2.1 0.7 - 4.0 K/uL   Monocytes Relative 7 %   Monocytes Absolute 0.5 0.1 - 1.0 K/uL   Eosinophils Relative 7 %   Eosinophils Absolute 0.5 0.0 - 0.5 K/uL   Basophils Relative 0 %   Basophils Absolute 0.0 0.0 - 0.1 K/uL   Immature Granulocytes 0 %   Abs Immature Granulocytes 0.01 0.00 - 0.07 K/uL  Basic metabolic panel     Status: None   Collection Time: 05/10/22  5:07 PM  Result Value Ref Range   Sodium 141 135 - 145 mmol/L   Potassium 3.7 3.5 - 5.1 mmol/L   Chloride 107 98 - 111 mmol/L   CO2 26 22 - 32 mmol/L   Glucose, Bld 93 70 - 99 mg/dL   BUN 13 6 - 20 mg/dL   Creatinine, Ser 0.60 0.44 - 1.00 mg/dL   Calcium 9.0 8.9 - 10.3 mg/dL   GFR, Estimated >60 >60 mL/min   Anion gap 8 5 - 15  Protime-INR     Status: None   Collection Time: 05/10/22  5:07 PM  Result Value Ref Range   Prothrombin Time 15.0 11.4 - 15.2 seconds   INR 1.2 0.8 - 1.2   POC CBG, ED     Status: None   Collection Time: 05/10/22  5:09 PM  Result Value Ref Range   Glucose-Capillary 70 70 - 99 mg/dL   In Ed pt received  Meds ordered this encounter  Medications    stroke: early stages of recovery book   OR Linked Order Group    acetaminophen (TYLENOL) tablet 650 mg    acetaminophen (TYLENOL) 160 MG/5ML solution 650 mg    acetaminophen (TYLENOL) suppository 650 mg   DISCONTD: pantoprazole (PROTONIX) injection 40 mg   levETIRAcetam (KEPPRA) IVPB 500 mg/100 mL premix   insulin aspart (novoLOG) injection 0-9 Units    Order Specific Question:   Correction coverage:    Answer:   Sensitive (thin, NPO, renal)    Order Specific Question:   CBG < 70:    Answer:   Implement Hypoglycemia Standing Orders and refer to Hypoglycemia Standing Orders sidebar report    Order Specific Question:   CBG 70 - 120:    Answer:   0 units    Order Specific Question:   CBG 121 - 150:    Answer:   1 unit    Order Specific Question:   CBG 151 - 200:    Answer:   2 units    Order Specific Question:   CBG 201 - 250:    Answer:   3 units    Order Specific Question:   CBG 251 - 300:    Answer:   5 units    Order Specific Question:   CBG 301 - 350:    Answer:   7 units    Order Specific Question:   CBG 351 - 400    Answer:   9 units    Order Specific Question:   CBG > 400    Answer:   call MD and obtain STAT lab verification   pantoprazole (PROTONIX) injection  40 mg    Unresulted Labs (From admission, onward)     Start     Ordered   05/10/22 1703  Urinalysis, Routine w reflex microscopic  Once,   URGENT        05/10/22 1703   Signed and Held  HIV Antibody (routine testing w rflx)  (HIV Antibody (Routine testing w reflex) panel)  Once,   R        Signed and Held   Signed and Held  Hemoglobin A1c  Once,   R       Comments: To assess prior glycemic control    Signed and Held   Signed and Held  Comprehensive metabolic panel  Tomorrow morning,   R        Signed and Held    Signed and Held  CBC with Differential/Platelet  Tomorrow morning,   R        Signed and Held   Signed and Held  Magnesium  Tomorrow morning,   R        Signed and Held   Signed and Held  Phosphorus  Tomorrow morning,   R        Signed and Held            Admission Imaging : CT Head Wo Contrast  Result Date: 05/10/2022 CLINICAL DATA:  Altered mental status EXAM: CT HEAD WITHOUT CONTRAST TECHNIQUE: Contiguous axial images were obtained from the base of the skull through the vertex without intravenous contrast. RADIATION DOSE REDUCTION: This exam was performed according to the departmental dose-optimization program which includes automated exposure control, adjustment of the mA and/or kV according to patient size and/or use of iterative reconstruction technique. COMPARISON:  04/13/2022 FINDINGS: Brain: Redemonstrated left subdural collection, now predominantly hypodense and chronic, which measures up to 12 mm, previously 12 mm. No hyperdense blood products to suggest acute hemorrhage. Overall unchanged mass effect with 6 mm of left-to-right midline shift. Narrowing of the left lateral ventricle, unchanged. No acute infarct, hemorrhage, mass, or hydrocephalus. Vascular: No hyperdense vessel. Skull: Negative for fracture or focal lesion. Sinuses/Orbits: Clear paranasal sinuses. The orbits are unremarkable. Other: The mastoids are well aerated. IMPRESSION: Redemonstrated left subdural collection, now predominantly hypodense and chronic, with unchanged mass effect and 6 mm of left-to-right midline shift. No hyperdense blood products to suggest acute hemorrhage. Electronically Signed   By: Merilyn Baba M.D.   On: 05/10/2022 17:29      Physical Examination: Vitals:   05/10/22 1700 05/10/22 1701 05/10/22 2003  BP: (!) 149/65  (!) 135/58  Pulse: 71  62  Temp: 98.3 F (36.8 C)  98.1 F (36.7 C)  Resp: 18  18  Weight:  93 kg   SpO2: 100%  100%  TempSrc: Oral  Oral   Physical Exam Vitals and  nursing note reviewed.  Constitutional:      General: She is not in acute distress.    Appearance: Normal appearance. She is not ill-appearing, toxic-appearing or diaphoretic.  HENT:     Head: Normocephalic and atraumatic.     Right Ear: Hearing and external ear normal.     Left Ear: Hearing and external ear normal.     Nose: Nose normal. No nasal deformity.     Mouth/Throat:     Lips: Pink.     Mouth: Mucous membranes are moist.     Tongue: No lesions.     Pharynx: Oropharynx is clear.  Eyes:     Extraocular Movements: Extraocular movements  intact.     Pupils: Pupils are equal, round, and reactive to light.  Neck:     Vascular: No carotid bruit.  Cardiovascular:     Rate and Rhythm: Normal rate and regular rhythm.     Pulses: Normal pulses.     Heart sounds: Normal heart sounds.  Pulmonary:     Effort: Pulmonary effort is normal.     Breath sounds: Normal breath sounds.  Abdominal:     General: Bowel sounds are normal. There is no distension.     Palpations: Abdomen is soft. There is no mass.     Tenderness: There is no abdominal tenderness. There is no guarding.     Hernia: No hernia is present.  Musculoskeletal:     Right lower leg: No edema.     Left lower leg: No edema.  Skin:    General: Skin is warm.  Neurological:     General: No focal deficit present.     Mental Status: She is alert and oriented to person, place, and time.     Cranial Nerves: Cranial nerves 2-12 are intact.     Motor: Motor function is intact.  Psychiatric:        Attention and Perception: Attention normal.        Mood and Affect: Mood normal.        Speech: Speech normal.        Behavior: Behavior normal. Behavior is cooperative.        Cognition and Memory: Cognition normal.        Assessment and Plan: >Slurred speech/altered mental status> Patient presenting with altered mental status and slurred speech and some lethargy and ill-appearing fatigue slowness. Family at bedside.  Patient  found to have subdural hematoma. We will admit patient to stepdown unit with neurochecks. Blood pressure goals of 130s to 140s. Aspiration and fall cautions.  > Subdural hematoma: Patient found to have a subdural hematoma with mass effect. Suspect recurrence of hematoma with anticoagulation use possibly. We will avoid any antiplatelet agents.  Follow PT/INR and CBC. Neurosurgery consulted and Dr. Cari Caraway following patient. Patient currently is neurologically intact, nonfocal.  Continue Keppra for seizure precautions. Disc continue Eliquis.  >Anemia > Patient found to mild anemia that is new. Type and screen.    Latest Ref Rng & Units 05/10/2022   11:00 PM 05/10/2022    5:07 PM 01/30/2022    8:31 AM  CBC  WBC 4.0 - 10.5 K/uL 5.2  7.0  5.3   Hemoglobin 12.0 - 15.0 g/dL 10.1  11.8  12.9   Hematocrit 36.0 - 46.0 % 31.5  38.1  39.4   Platelets 150 - 400 K/uL 227  240  227      Type 2 diabetes mellitus with hyperlipidemia (HCC) > Sliding scale insulin regimen.   Pulmonary embolism, bilateral (HCC) > History of bilateral pulmonary embolism and patient restarted on Eliquis. Currently off of any anticoagulation secondary to subdural hematoma.    DVT prophylaxis:  SCDs  Code Status:  Full  Family Communication:  Lindsea, Olivar (Spouse)  [phone]    Disposition Plan:  Home  Consults called:  Neuro surgery: Dr. Cari Caraway  Admission status: In patient  Unit/ Expected LOS: Stepdown unit   Para Skeans MD [facility]  6 PM- 2 AM. Please contact me via secure Chat 6 PM-2 AM. [phone] ( Pager ) To contact the [facility] Attending or Consulting provider [place] or covering provider during after hours [place], for  this patient.   Check the care team in [facility] and look for a) attending/consulting [facility] provider listed and b) the [facility] team listed Log into [email or web address] and use [place]'s universal password to access. If you do not have the password, please contact  the hospital operator. Locate the [facility] provider you are looking for under [facility] and page to a number that you can be directly reached. If you still have difficulty reaching the provider, please page the [facility] (Director on Call) for the Hospitalists listed on amion for assistance. [email or web address] 05/10/2022, 11:06 PM

## 2022-05-10 NOTE — ED Notes (Signed)
First Nurse: per pts husband pt was noted to have slurred speech when he got home from work, LKW was 0700 this am.

## 2022-05-10 NOTE — ED Provider Notes (Signed)
[facility] Provider Note    None    (approximate)   History   Altered Mental Status   HPI  Bianca Simmons is a 56 y.o. female with a history of recent intracranial hemorrhage after a fall and trauma.  She was cared for at [facility] on review of records there.  She has noticed this afternoon that her speech seems slightly difficult and she just feels more fatigued than typical.  No headache.  She is still taking anticoagulant but last took it yesterday in the evening has had none today.  No weakness on one side of the body.  No headache.  No facial droop.  However she feels like her speech is just a little hard or words are not coming as easily as they should.  No fevers or chills no chest pain no trouble breathing.  No interim falls or injuries   Last noticed to be normal about 7 AM  Physical Exam   Triage Vital Signs: ED Triage Vitals  Enc Vitals Group     BP 05/10/22 1700 (!) 149/65     Pulse Rate 05/10/22 1700 71     Resp 05/10/22 1700 18     Temp 05/10/22 1700 98.3 F (36.8 C)     Temp Source 05/10/22 1700 Oral     SpO2 05/10/22 1700 100 %     Weight 05/10/22 1701 205 lb (93 kg)     Height --      Head Circumference --      Peak Flow --      Pain Score 05/10/22 1701 0     Pain Loc --      Pain Edu? --      Excl. in [place]? --     Most recent vital signs: Vitals:   05/10/22 1700  BP: (!) 149/65  Pulse: 71  Resp: 18  Temp: 98.3 F (36.8 C)  SpO2: 100%     General: Awake, no distress.  Normocephalic atraumatic CV:  Good peripheral perfusion.  Resp:  Normal effort.  Abd:  No distention.  Other:  Equal facial expressions.  Normal extraocular movements.  Cranial nerves are normal except it does seem like she has a slight thickness to her speech or slight word finding difficulty.  Moves all extremities well with 5 out of 5 strength.  He is fully alert and well oriented.  Husband at the bedside very pleasant   ED Results /  Procedures / Treatments   Labs (all labs ordered are listed, but only abnormal results are displayed) Labs Reviewed  CBC WITH DIFFERENTIAL/PLATELET - Abnormal; Notable for the following components:      Result Value   Hemoglobin 11.8 (*)    All other components within normal limits  BASIC METABOLIC PANEL  PROTIME-INR  URINALYSIS, ROUTINE W REFLEX MICROSCOPIC  CBG MONITORING, ED    RADIOLOGY Personally interpreted the patient's CT scan of the head for acute gross pathology, and noted concern for what appears to be a subdural hematoma.  I immediately called involved Dr. Cari Caraway, full radiologist report also reviewed now  CT Head Wo Contrast  Result Date: 05/10/2022 CLINICAL DATA:  Altered mental status EXAM: CT HEAD WITHOUT CONTRAST TECHNIQUE: Contiguous axial images were obtained from the base of the skull through the vertex without intravenous contrast. RADIATION DOSE REDUCTION: This exam was performed according to the departmental dose-optimization program which includes automated exposure control, adjustment of the mA and/or kV according to patient size and/or  use of iterative reconstruction technique. COMPARISON:  04/13/2022 FINDINGS: Brain: Redemonstrated left subdural collection, now predominantly hypodense and chronic, which measures up to 12 mm, previously 12 mm. No hyperdense blood products to suggest acute hemorrhage. Overall unchanged mass effect with 6 mm of left-to-right midline shift. Narrowing of the left lateral ventricle, unchanged. No acute infarct, hemorrhage, mass, or hydrocephalus. Vascular: No hyperdense vessel. Skull: Negative for fracture or focal lesion. Sinuses/Orbits: Clear paranasal sinuses. The orbits are unremarkable. Other: The mastoids are well aerated. IMPRESSION: Redemonstrated left subdural collection, now predominantly hypodense and chronic, with unchanged mass effect and 6 mm of left-to-right midline shift. No hyperdense blood products to suggest acute  hemorrhage. Electronically Signed   By: Merilyn Baba M.D.   On: 05/10/2022 17:29      PROCEDURES:  Critical Care performed: Yes, see critical care procedure note(s)  CRITICAL CARE Performed by: Delman Kitten   Total critical care time: 30 minutes  Critical care time was exclusive of separately billable procedures and treating other patients.  Critical care was necessary to treat or prevent imminent or life-threatening deterioration.  Critical care was time spent personally by me on the following activities: development of treatment plan with patient and/or surrogate as well as nursing, discussions with consultants, evaluation of patient's response to treatment, examination of patient, obtaining history from patient or surrogate, ordering and performing treatments and interventions, ordering and review of laboratory studies, ordering and review of radiographic studies, pulse oximetry and re-evaluation of patient's condition.   Procedures   MEDICATIONS ORDERED IN ED: Medications - No data to display   IMPRESSION / MDM / [place] / ED COURSE  I reviewed the triage vital signs and the nursing notes.                              Differential diagnosis includes, but is not limited to, subdural hematoma, intracranial hemorrhage, mass effect, ischemia, stroke, etc.  Patient has no systemic symptoms except for feels just slightly generalized weakness but having some speech changes today.  On anticoagulant but last dose was yesterday.  On review of her CT imaging very concerning that there is a central cause such as potential mass effect from her subdural hematoma.  I discussed the case with Dr. Cari Caraway emergently, and he reviewed imaging and advises against anticoagulation reversal given last dose approximately 24 hours ago, and advises admission to the hospital, and neurosurgery will plan to see patient tomorrow morning with possible need for bur hole procedure.  I discussed  this with the patient and her husband, and we also offered that she may wish to be transferred to [facility] to continue care there, but patient and her husband after discussing would like to stay here to continue care with our neurosurgical team Dr. Cari Caraway.  She does not have evidence of obvious acute decompensation and we will continue with close neurochecks.  Dr. Cari Caraway advises that is the ideal state given use of oral anticoagulant would be to have surgery done tomorrow and he would recommend against emergent surgery this evening which I think is quite reasonable unless of course symptoms were to change or progress rapidly.  Patient's presentation is most consistent with acute presentation with potential threat to life or bodily function.  The patient is on the cardiac monitor to evaluate for evidence of arrhythmia and/or significant heart rate changes.  Consulted with the hospitalist, discussed plan for neurosurgical consultation and possible neurosurgical procedure  tomorrow with her hospitalist Dr. Girard Cooter Who is excepting of admission   Neurochecks every 2 hours ordered in the ED.  Patient and husband both understand agreeable with plan for admission.  FINAL CLINICAL IMPRESSION(S) / ED DIAGNOSES   Final diagnoses:  Subdural hematoma ([place])  Dysarthria     Rx / DC Orders   ED Discharge Orders     None        Note:  This document was prepared using Dragon voice recognition software and may include unintentional dictation errors.   Delman Kitten, MD 05/10/22 [id number]

## 2022-05-10 NOTE — ED Notes (Signed)
Pt able to swallow without difficulties.  Ginger ale provided.

## 2022-05-11 ENCOUNTER — Inpatient Hospital Stay: Payer: BC Managed Care – PPO | Admitting: Registered Nurse

## 2022-05-11 ENCOUNTER — Telehealth: Payer: Self-pay

## 2022-05-11 ENCOUNTER — Encounter
Admission: EM | Disposition: A | Discharge status: Home / Self Care | Payer: Self-pay | Source: Home / Self Care | Attending: Family Medicine

## 2022-05-11 ENCOUNTER — Other Ambulatory Visit: Payer: Self-pay

## 2022-05-11 DIAGNOSIS — R471 Dysarthria and anarthria: Secondary | ICD-10-CM

## 2022-05-11 DIAGNOSIS — S065XAA Traumatic subdural hemorrhage with loss of consciousness status unknown, initial encounter: Secondary | ICD-10-CM

## 2022-05-11 DIAGNOSIS — S065X0A Traumatic subdural hemorrhage without loss of consciousness, initial encounter: Secondary | ICD-10-CM

## 2022-05-11 DIAGNOSIS — I2699 Other pulmonary embolism without acute cor pulmonale: Secondary | ICD-10-CM

## 2022-05-11 HISTORY — PX: BURR HOLE: SHX908

## 2022-05-11 LAB — GLUCOSE, CAPILLARY
Glucose-Capillary: 108 mg/dL — ABNORMAL HIGH (ref 70–99)
Glucose-Capillary: 115 mg/dL — ABNORMAL HIGH (ref 70–99)
Glucose-Capillary: 145 mg/dL — ABNORMAL HIGH (ref 70–99)
Glucose-Capillary: 74 mg/dL (ref 70–99)
Glucose-Capillary: 90 mg/dL (ref 70–99)

## 2022-05-11 LAB — CBG MONITORING, ED
Glucose-Capillary: 95 mg/dL (ref 70–99)
Glucose-Capillary: 89 mg/dL (ref 70–99)

## 2022-05-11 LAB — COMPREHENSIVE METABOLIC PANEL
ALT: 33 U/L (ref 0–44)
AST: 31 U/L (ref 15–41)
Albumin: 3 g/dL — ABNORMAL LOW (ref 3.5–5.0)
Alkaline Phosphatase: 70 U/L (ref 38–126)
Anion gap: 6 (ref 5–15)
BUN: 9 mg/dL (ref 6–20)
CO2: 27 mmol/L (ref 22–32)
Calcium: 8.7 mg/dL — ABNORMAL LOW (ref 8.9–10.3)
Chloride: 109 mmol/L (ref 98–111)
Creatinine, Ser: 0.61 mg/dL (ref 0.44–1.00)
GFR, Estimated: >60 mL/min (ref >60)
Glucose, Bld: 97 mg/dL (ref 70–99)
Potassium: 3.7 mmol/L (ref 3.5–5.1)
Sodium: 142 mmol/L (ref 135–145)
Total Bilirubin: 0.7 mg/dL (ref 0.3–1.2)
Total Protein: 6.1 g/dL — ABNORMAL LOW (ref 6.5–8.1)

## 2022-05-11 LAB — CBC WITH DIFFERENTIAL/PLATELET
Abs Immature Granulocytes: 0.01 10*3/uL (ref 0.00–0.07)
Basophils Absolute: 0 10*3/uL (ref 0.0–0.1)
Basophils Relative: 1 %
Eosinophils Absolute: 0.4 10*3/uL (ref 0.0–0.5)
Eosinophils Relative: 9 %
HCT: 31.7 % — ABNORMAL LOW (ref 36.0–46.0)
Hemoglobin: 10.1 g/dL — ABNORMAL LOW (ref 12.0–15.0)
Immature Granulocytes: 0 %
Lymphocytes Relative: 39 %
Lymphs Abs: 1.7 10*3/uL (ref 0.7–4.0)
MCH: 28.5 pg (ref 26.0–34.0)
MCHC: 31.9 g/dL (ref 30.0–36.0)
MCV: 89.5 fL (ref 80.0–100.0)
Monocytes Absolute: 0.4 10*3/uL (ref 0.1–1.0)
Monocytes Relative: 8 %
Neutro Abs: 1.9 10*3/uL (ref 1.7–7.7)
Neutrophils Relative %: 43 %
Platelets: 210 10*3/uL (ref 150–400)
RBC: 3.54 MIL/uL — ABNORMAL LOW (ref 3.87–5.11)
RDW: 13.6 % (ref 11.5–15.5)
WBC: 4.4 10*3/uL (ref 4.0–10.5)
nRBC: 0 % (ref 0.0–0.2)

## 2022-05-11 LAB — MRSA NEXT GEN BY PCR, NASAL: MRSA by PCR Next Gen: NOT DETECTED

## 2022-05-11 LAB — HEMOGLOBIN A1C
Hgb A1c MFr Bld: 5.8 % — ABNORMAL HIGH (ref 4.8–5.6)
Mean Plasma Glucose: 119.76 mg/dL

## 2022-05-11 LAB — MAGNESIUM: Magnesium: 1.8 mg/dL (ref 1.7–2.4)

## 2022-05-11 LAB — PHOSPHORUS: Phosphorus: 5.4 mg/dL — ABNORMAL HIGH (ref 2.5–4.6)

## 2022-05-11 LAB — SARS CORONAVIRUS 2 BY RT PCR: SARS Coronavirus 2 by RT PCR: NEGATIVE

## 2022-05-11 SURGERY — CREATION, CRANIAL BURR HOLE
Anesthesia: General | Site: Head | Laterality: Left

## 2022-05-11 MED ORDER — ONDANSETRON HCL 4 MG/2ML IJ SOLN
INTRAMUSCULAR | Status: AC
  Filled 2022-05-11: qty 2

## 2022-05-11 MED ORDER — PROPOFOL 10 MG/ML IV BOLUS
INTRAVENOUS | Status: DC | PRN
  Administered 2022-05-11 (×2): 40 mg via INTRAVENOUS
  Administered 2022-05-11: 120 mg via INTRAVENOUS

## 2022-05-11 MED ORDER — FENTANYL CITRATE (PF) 100 MCG/2ML IJ SOLN
INTRAMUSCULAR | Status: AC
  Filled 2022-05-11: qty 2

## 2022-05-11 MED ORDER — LIDOCAINE-EPINEPHRINE 1 %-1:100000 IJ SOLN
INTRAMUSCULAR | Status: DC | PRN
  Administered 2022-05-11: 6 mL

## 2022-05-11 MED ORDER — LIDOCAINE HCL (PF) 2 % IJ SOLN
INTRAMUSCULAR | Status: AC
  Filled 2022-05-11: qty 5

## 2022-05-11 MED ORDER — THROMBIN 5000 UNITS EX SOLR
CUTANEOUS | Status: AC
  Filled 2022-05-11: qty 5000

## 2022-05-11 MED ORDER — ONDANSETRON HCL 4 MG/2ML IJ SOLN
INTRAMUSCULAR | Status: DC | PRN
  Administered 2022-05-11: 4 mg via INTRAVENOUS

## 2022-05-11 MED ORDER — FENTANYL CITRATE (PF) 100 MCG/2ML IJ SOLN
INTRAMUSCULAR | Status: DC | PRN
  Administered 2022-05-11: 100 ug via INTRAVENOUS

## 2022-05-11 MED ORDER — ACETAMINOPHEN 10 MG/ML IV SOLN
INTRAVENOUS | Status: DC | PRN
  Administered 2022-05-11: 1000 mg via INTRAVENOUS

## 2022-05-11 MED ORDER — EPHEDRINE SULFATE (PRESSORS) 50 MG/ML IJ SOLN
INTRAMUSCULAR | Status: DC | PRN
  Administered 2022-05-11 (×2): 10 mg via INTRAVENOUS
  Administered 2022-05-11: 5 mg via INTRAVENOUS

## 2022-05-11 MED ORDER — PROPOFOL 10 MG/ML IV BOLUS
INTRAVENOUS | Status: AC
  Filled 2022-05-11: qty 20

## 2022-05-11 MED ORDER — SODIUM CHLORIDE 0.9 % IV BOLUS
250.0000 mL | Freq: Once | INTRAVENOUS | Status: AC
  Administered 2022-05-11: 250 mL via INTRAVENOUS

## 2022-05-11 MED ORDER — SUGAMMADEX SODIUM 200 MG/2ML IV SOLN
INTRAVENOUS | Status: DC | PRN
  Administered 2022-05-11: 160 mg via INTRAVENOUS

## 2022-05-11 MED ORDER — OXYCODONE HCL 5 MG PO TABS: 5.0000 mg | ORAL_TABLET | Freq: Once | ORAL | Status: DC | PRN

## 2022-05-11 MED ORDER — HYDROMORPHONE HCL 1 MG/ML IJ SOLN
0.5000 mg | INTRAMUSCULAR | Status: DC | PRN
  Administered 2022-05-11: 0.5 mg via INTRAVENOUS
  Filled 2022-05-11: qty 1

## 2022-05-11 MED ORDER — DEXMEDETOMIDINE HCL IN NACL 200 MCG/50ML IV SOLN
INTRAVENOUS | Status: DC | PRN
  Administered 2022-05-11: 4 ug via INTRAVENOUS
  Administered 2022-05-11 (×2): 12 ug via INTRAVENOUS
  Administered 2022-05-11: 8 ug via INTRAVENOUS

## 2022-05-11 MED ORDER — PHENYLEPHRINE HCL (PRESSORS) 10 MG/ML IV SOLN
INTRAVENOUS | Status: DC | PRN
  Administered 2022-05-11: 80 ug via INTRAVENOUS

## 2022-05-11 MED ORDER — DEXAMETHASONE SODIUM PHOSPHATE 10 MG/ML IJ SOLN
INTRAMUSCULAR | Status: DC | PRN
  Administered 2022-05-11: 10 mg via INTRAVENOUS

## 2022-05-11 MED ORDER — CEFAZOLIN SODIUM-DEXTROSE 2-3 GM-%(50ML) IV SOLR
INTRAVENOUS | Status: DC | PRN
  Administered 2022-05-11: 2 g via INTRAVENOUS

## 2022-05-11 MED ORDER — OXYCODONE HCL 5 MG/5ML PO SOLN: 5.0000 mg | Freq: Once | ORAL | Status: DC | PRN

## 2022-05-11 MED ORDER — CEFAZOLIN SODIUM 1 G IJ SOLR
INTRAMUSCULAR | Status: AC
  Filled 2022-05-11: qty 20

## 2022-05-11 MED ORDER — GELATIN ABSORBABLE 100 CM EX MISC
CUTANEOUS | Status: AC
  Filled 2022-05-11: qty 1

## 2022-05-11 MED ORDER — CHLORHEXIDINE GLUCONATE CLOTH 2 % EX PADS
6.0000 | MEDICATED_PAD | Freq: Every day | CUTANEOUS | Status: DC
  Administered 2022-05-11 – 2022-05-13 (×2): 6 via TOPICAL

## 2022-05-11 MED ORDER — DEXAMETHASONE SODIUM PHOSPHATE 10 MG/ML IJ SOLN
INTRAMUSCULAR | Status: AC
  Filled 2022-05-11: qty 1

## 2022-05-11 MED ORDER — LIDOCAINE-EPINEPHRINE 1 %-1:100000 IJ SOLN
INTRAMUSCULAR | Status: AC
  Filled 2022-05-11: qty 1

## 2022-05-11 MED ORDER — FENTANYL CITRATE (PF) 100 MCG/2ML IJ SOLN: 25.0000 ug | INTRAMUSCULAR | Status: DC | PRN

## 2022-05-11 MED ORDER — EPHEDRINE 5 MG/ML INJ
INTRAVENOUS | Status: AC
  Filled 2022-05-11: qty 5

## 2022-05-11 MED ORDER — LIDOCAINE HCL (CARDIAC) PF 100 MG/5ML IV SOSY
PREFILLED_SYRINGE | INTRAVENOUS | Status: DC | PRN
  Administered 2022-05-11: 50 mg via INTRAVENOUS

## 2022-05-11 MED ORDER — SODIUM CHLORIDE 0.9 % IV SOLN: INTRAVENOUS | Status: DC | PRN

## 2022-05-11 MED ORDER — SODIUM CHLORIDE (PF) 0.9 % IJ SOLN
INTRAMUSCULAR | Status: AC
  Filled 2022-05-11: qty 10

## 2022-05-11 MED ORDER — ACETAMINOPHEN 10 MG/ML IV SOLN
INTRAVENOUS | Status: AC
  Filled 2022-05-11: qty 100

## 2022-05-11 MED ORDER — SODIUM CHLORIDE 0.9 % IR SOLN
Status: DC | PRN
  Administered 2022-05-11: 1000 mL

## 2022-05-11 MED ORDER — ROCURONIUM BROMIDE 10 MG/ML (PF) SYRINGE
PREFILLED_SYRINGE | INTRAVENOUS | Status: AC
  Filled 2022-05-11: qty 10

## 2022-05-11 MED ORDER — ROCURONIUM BROMIDE 100 MG/10ML IV SOLN
INTRAVENOUS | Status: DC | PRN
  Administered 2022-05-11: 30 mg via INTRAVENOUS
  Administered 2022-05-11: 20 mg via INTRAVENOUS

## 2022-05-11 MED ORDER — PHENYLEPHRINE 80 MCG/ML (10ML) SYRINGE FOR IV PUSH (FOR BLOOD PRESSURE SUPPORT)
PREFILLED_SYRINGE | INTRAVENOUS | Status: AC
  Filled 2022-05-11: qty 10

## 2022-05-11 SURGICAL SUPPLY — 70 items
AGENT HMST KT MTR STRL THRMB (HEMOSTASIS)
APL PRP STRL LF ISPRP CHG 10.5 (MISCELLANEOUS) ×2
APL SKNCLS STERI-STRIP NONHPOA (GAUZE/BANDAGES/DRESSINGS)
APPLICATOR CHLORAPREP 10.5 ORG (MISCELLANEOUS) ×1 IMPLANT
BENZOIN TINCTURE PRP APPL 2/3 (GAUZE/BANDAGES/DRESSINGS) IMPLANT
BLADE CLIPPER SPEC (BLADE) ×1 IMPLANT
BUR ACORN 7.5 PRECISION (BURR) ×1 IMPLANT
CATH ROBINSON RED A/P 10FR (CATHETERS) IMPLANT
COUNTER NEEDLE 20/40 LG (NEEDLE) ×1 IMPLANT
COVER LIGHT HANDLE STERIS (MISCELLANEOUS) ×2 IMPLANT
DRAPE SURG 17X11 SM STRL (DRAPES) ×4 IMPLANT
DRAPE WARM FLUID 44X44 (DRAPES) ×1 IMPLANT
DRSG TEGADERM 4X4.75 (GAUZE/BANDAGES/DRESSINGS) IMPLANT
DRSG TELFA 3X4 N-ADH STERILE (GAUZE/BANDAGES/DRESSINGS) ×1 IMPLANT
ELECT CAUTERY BLADE TIP 2.5 (TIP)
ELECT REM PT RETURN 9FT ADLT (ELECTROSURGICAL) ×1
ELECTRODE CAUTERY BLDE TIP 2.5 (TIP) ×1 IMPLANT
ELECTRODE REM PT RTRN 9FT ADLT (ELECTROSURGICAL) ×1 IMPLANT
GAUZE 4X4 16PLY ~~LOC~~+RFID DBL (SPONGE) ×3 IMPLANT
GAUZE SPONGE 4X4 12PLY STRL (GAUZE/BANDAGES/DRESSINGS) ×4 IMPLANT
GAUZE XEROFORM 1X8 LF (GAUZE/BANDAGES/DRESSINGS) IMPLANT
GAUZE XEROFORM 5X9 LF (GAUZE/BANDAGES/DRESSINGS) IMPLANT
GLOVE BIOGEL PI IND STRL 6.5 (GLOVE) ×1 IMPLANT
GLOVE SURG SYN 6.5 ES PF (GLOVE) IMPLANT
GLOVE SURG SYN 6.5 PF PI (GLOVE) ×1 IMPLANT
GLOVE SURG SYN 8.5  E (GLOVE) ×4
GLOVE SURG SYN 8.5 E (GLOVE) ×4 IMPLANT
GLOVE SURG SYN 8.5 PF PI (GLOVE) ×6 IMPLANT
GOWN SRG LRG LVL 4 IMPRV REINF (GOWNS) ×1 IMPLANT
GOWN SRG XL LVL 3 NONREINFORCE (GOWNS) ×1 IMPLANT
GOWN STRL NON-REIN TWL XL LVL3 (GOWNS) ×1
GOWN STRL REIN LRG LVL4 (GOWNS) ×1
GRADUATE 1200CC STRL 31836 (MISCELLANEOUS) ×1 IMPLANT
HOLDER FOLEY CATH W/STRAP (MISCELLANEOUS) IMPLANT
KIT DRAIN CSF ACCUDRAIN (MISCELLANEOUS) ×1 IMPLANT
KIT TURNOVER KIT A (KITS) ×1 IMPLANT
MANIFOLD NEPTUNE II (INSTRUMENTS) ×1 IMPLANT
MARKER SKIN DUAL TIP RULER LAB (MISCELLANEOUS) ×2 IMPLANT
MAT ABSORB  FLUID 56X50 GRAY (MISCELLANEOUS)
MAT ABSORB FLUID 56X50 GRAY (MISCELLANEOUS) ×1 IMPLANT
NDL HYPO 25GX1X1/2 BEV (NEEDLE) ×1 IMPLANT
NEEDLE HYPO 25GX1X1/2 BEV (NEEDLE) ×1 IMPLANT
NS IRRIG 1000ML POUR BTL (IV SOLUTION) IMPLANT
PACK LAMINECTOMY NEURO (CUSTOM PROCEDURE TRAY) ×1 IMPLANT
PAD ARMBOARD 7.5X6 YLW CONV (MISCELLANEOUS) ×2 IMPLANT
PAD PREP 24X41 OB/GYN DISP (PERSONAL CARE ITEMS) ×1 IMPLANT
PIN MAYFIELD SKULL DISP (PIN) IMPLANT
SCRUB TECHNI CARE 4 OZ NO DYE (MISCELLANEOUS) ×1 IMPLANT
SET CATH VENT DRAIN 3-15 1.9D (DRAIN) ×1 IMPLANT
SHEET NEURO XL SOL CTL (MISCELLANEOUS) ×1 IMPLANT
SOL PREP PVP 2OZ (MISCELLANEOUS) ×2
SOLUTION IRRIG SURGIPHOR (IV SOLUTION) ×1 IMPLANT
SOLUTION PREP PVP 2OZ (MISCELLANEOUS) ×1 IMPLANT
STAPLER SKIN PROX 35W (STAPLE) ×2 IMPLANT
STRIP CLOSURE SKIN 1/2X4 (GAUZE/BANDAGES/DRESSINGS) ×1 IMPLANT
SURGIFLO W/THROMBIN 8M KIT (HEMOSTASIS) IMPLANT
SURGILUBE 2OZ TUBE FLIPTOP (MISCELLANEOUS) IMPLANT
SUT MNCRL AB 3-0 PS2 27 (SUTURE) ×1 IMPLANT
SUT SILK 2 0 SH CR/8 (SUTURE) ×1 IMPLANT
SUT VIC AB 3-0 SH 8-18 (SUTURE) ×1 IMPLANT
SYR 10ML LL (SYRINGE) ×2 IMPLANT
TAPE CLOTH 3X10 WHT NS LF (GAUZE/BANDAGES/DRESSINGS) IMPLANT
TAPE HY-TAPE .5X5Y PINK LF (GAUZE/BANDAGES/DRESSINGS) ×1 IMPLANT
TOWEL OR 17X26 4PK STRL BLUE (TOWEL DISPOSABLE) ×3 IMPLANT
TRAP FLUID SMOKE EVACUATOR (MISCELLANEOUS) ×1 IMPLANT
TRAY FOLEY MTR SLVR 16FR STAT (SET/KITS/TRAYS/PACK) IMPLANT
TUBING ART PRESS 48 MALE/FEM (TUBING) IMPLANT
TUBING CONNECTING 10 (TUBING) ×1 IMPLANT
WATER STERILE IRR 1000ML POUR (IV SOLUTION) ×4 IMPLANT
duet external drainage and monitoring system, interlink injection sites IMPLANT

## 2022-05-11 NOTE — Progress Notes (Signed)
PT Cancellation Note  Patient Details Name: Bianca Simmons MRN: [id number] DOB: 11/17/1965   Cancelled Treatment:    Reason Eval/Treat Not Completed: Medical issues which prohibited therapy (Consult received and chart reviewed.  Patient scheduled for surgical evacuation (burr holes) of SDH this PM; will hold PT until procedure complete and patient appropriate for mobility evaluation.)   Zayden Hahne H. Owens Shark, PT, DPT, NCS 05/11/22, 7:49 AM [phone]

## 2022-05-11 NOTE — H&P (View-Only) (Signed)
Referring Physician:  No referring provider defined for this encounter.  Primary Physician:  Bianca Body, MD  History of Present Illness: 05/11/2022 [name] is here today with a chief complaint of worsening mental status and speech over the past few days.  She has a known left-sided subdural hematoma that was treated with middle meningeal artery embolization on September 21.  This was performed at [facility].  Due to worsening mental status and neurologic symptoms, she presented to the emergency department for reevaluation.  Of note, she is on therapeutic anticoagulation for history of deep venous thrombosis and pulmonary embolism.  She reports headache as well as mild weakness on the right side.  She has some difficulty with producing speech.  The symptoms are causing a significant impact on the patient's life.   Review of Systems:  A 10 point review of systems is negative, except for the pertinent positives and negatives detailed in the HPI.  Past Medical History: Past Medical History:  Diagnosis Date   Diabetes mellitus without complication ([place])    DVT (deep venous thrombosis) ([place]) 06/08/2012   Hyperlipidemia    Hypertension    Personal history of venous thrombosis and embolism 04/19/2016   Pulmonary embolism 10 yrs ago 06/07/2012   Pulmonary embolism, blood-clot, obstetric 2002   on birth control-pulm blood clot-treated with coumadin-no problems now   SOB (shortness of breath) 06/07/2012    Past Surgical History: Past Surgical History:  Procedure Laterality Date   DILATATION & CURETTAGE/HYSTEROSCOPY WITH MYOSURE N/A 01/10/2019   Procedure: [place];  Surgeon: Benjaman Kindler, MD;  Location: [facility] ORS;  Service: Gynecology;  Laterality: N/A;   HYSTEROSCOPY WITH D & C N/A 01/10/2019   Procedure: DILATATION AND CURETTAGE /HYSTEROSCOPY;  Surgeon: Benjaman Kindler, MD;  Location: [facility] ORS;  Service: Gynecology;  Laterality:  N/A;   IUD REMOVAL  OCT 2012   LAPAROSCOPIC TUBAL LIGATION  07/14/2011   Procedure: LAPAROSCOPIC TUBAL LIGATION;  Surgeon: Agnes Lawrence, MD;  Location: [place] [facility]S;  Service: Gynecology;  Laterality: N/A;   NO PAST SURGERIES     TUBAL LIGATION  04/21/2011   Procedure: ESSURE TUBAL STERILIZATION;  Surgeon: Agnes Lawrence, MD;  Location: [place] [facility];  Service: Gynecology;  Laterality: Bilateral;   attempted essure,removal of IUD, hysteroscopy,         dilatation and currettage   vaginal ablasion      Allergies: Allergies as of 05/10/2022 - Review Complete 05/10/2022  Allergen Reaction Noted   Sulfa antibiotics Nausea Only 03/16/2011    Medications: Current Meds  Medication Sig   apixaban (ELIQUIS) 2.5 MG TABS tablet Take 2.5 mg by mouth 2 (two) times daily.   levETIRAcetam (KEPPRA) 500 MG tablet Take 500 mg by mouth 2 (two) times daily.   lisinopril (ZESTRIL) 5 MG tablet Take 5 mg by mouth daily.   lovastatin (MEVACOR) 20 MG tablet Take 20 mg by mouth at bedtime.   metFORMIN (GLUCOPHAGE) 500 MG tablet Take 500 mg by mouth daily with breakfast.    OZEMPIC, 1 MG/DOSE, 4 MG/3ML SOPN Inject into the skin.    Social History: Social History   Tobacco Use   Smoking status: Never   Smokeless tobacco: Never  Vaping Use   Vaping Use: Never used  Substance Use Topics   Alcohol use: No   Drug use: No    Family Medical History: Family History  Problem Relation Age of Onset   Diabetes Mother    Diabetes Father  Diabetes Sister     Physical Examination: Vitals:   05/11/22 0357 05/11/22 0556  BP: 138/66 (!) 126/44  Pulse: 76 76  Resp: 16 14  Temp: 98 F (36.7 C)   SpO2: 97% 96%    General: Patient is well developed, well nourished, calm, collected, and in no apparent distress. Attention to examination is appropriate.  Neck:   Supple.  Full range of motion.  Respiratory: Patient is breathing without any difficulty.   NEUROLOGICAL:     Awake, alert, oriented to  person, place, and November 2023.  She has some speech hesitation.  She is able to understand complex speech, but is having difficulty with production.  She has difficulty with repetition.  She is able to name 2 out of 3 objects.  Her fluency is impaired.  Fund of knowledge is appropriate.   Cranial Nerves: Pupils equal round and reactive to light.  Facial tone is symmetric.  Facial sensation is symmetric. Shoulder shrug is symmetric. Tongue protrusion is midline.  There is mild right pronator drift.    Strength: Side Biceps Triceps Deltoid Interossei Grip Wrist Ext. Wrist Flex.  R 4+ 4+ 4+ 4+ 4+ 5 5  L '5 5 5 5 5 5 5   '$ Side Iliopsoas Quads Hamstring PF DF EHL  R '5 5 5 5 5 5  '$ L '5 5 5 5 5 5    '$ Hoffman's is absent.   Bilateral upper and lower extremity sensation is intact to light touch.    No evidence of dysmetria noted.  Gait is untested.     Medical Decision Making  Imaging: CT Head 05/10/22 IMPRESSION: Redemonstrated left subdural collection, now predominantly hypodense and chronic, with unchanged mass effect and 6 mm of left-to-right midline shift. No hyperdense blood products to suggest acute hemorrhage.     Electronically Signed   By: Merilyn Baba M.D.   On: 05/10/2022 17:29  I have personally reviewed the images and agree with the above interpretation.  Assessment and Plan: Bianca Simmons is a pleasant 56 y.o. female with left-sided chronic subdural hematoma that is causing significant symptoms.  She has brain compression from this mass lesion as well as midline shift from left to right.  She has approximately 6 mm of midline shift.  She has right-sided weakness as well as some speech difficulty which are consistent with her imaging findings.  Given her worsening neurologic performance and previous treatment with middle meningeal artery embolization, I recommended surgical treatment with bur hole drainage.  Unfortunate, I do not think that conservative management is  appropriate at this point given her worsening neurologic status.  We will move forward with surgical intervention this evening when she has been 48 hours since her last dose of apixaban.  She expressed understanding.  I discussed the planned procedure at length with the patient, including the risks, benefits, alternatives, and indications. The risks discussed include but are not limited to bleeding, infection, need for reoperation, spinal fluid leak, stroke, vision loss, anesthetic complication, coma, paralysis, and even death. I also described in detail that improvement was not guaranteed.  The patient expressed understanding of these risks, and asked that we proceed with surgery. I described the surgery in layman's terms, and gave ample opportunity for questions, which were answered to the best of my ability.       Margi Edmundson K. Izora Ribas MD, [facility] Neurosurgery    Level 5 - risk of urgent surgical procedure with recent anticoagulation

## 2022-05-11 NOTE — Anesthesia Preprocedure Evaluation (Signed)
Anesthesia Evaluation  Patient identified by MRN, date of birth, ID band Patient awake    Reviewed: Allergy & Precautions, H&P , NPO status , Patient's Chart, lab work & pertinent test results, reviewed documented beta blocker date and time   History of Anesthesia Complications Negative for: history of anesthetic complications  Airway Mallampati: III  TM Distance: >3 FB Neck ROM: full    Dental  (+) Dental Advidsory Given, Missing   Pulmonary neg pulmonary ROS, neg shortness of breath, neg COPD,    Pulmonary exam normal        Cardiovascular Exercise Tolerance: Good hypertension, (-) angina(-) Past MI and (-) CABG negative cardio ROS Normal cardiovascular exam Rate:Normal     Neuro/Psych  Headaches, negative psych ROS   GI/Hepatic negative GI ROS, Neg liver ROS,   Endo/Other  negative endocrine ROSdiabetes  Renal/GU negative Renal ROS  negative genitourinary   Musculoskeletal   Abdominal   Peds  Hematology  (+) Blood dyscrasia, anemia ,   Anesthesia Other Findings Past Medical History: No date: Diabetes mellitus without complication ([place]) 83/07/5174: DVT (deep venous thrombosis) ([facility]) No date: Hyperlipidemia No date: Hypertension 04/19/2016: Personal history of venous thrombosis and embolism 06/07/2012: Pulmonary embolism 10 yrs ago 2002: Pulmonary embolism, blood-clot, obstetric     Comment:  on birth control-pulm blood clot-treated with               coumadin-no problems now 06/07/2012: SOB (shortness of breath)  Past Surgical History: 01/10/2019: DILATATION & CURETTAGE/HYSTEROSCOPY WITH MYOSURE; N/A     Comment:  Procedure: DILATATION & CURETTAGE/HYSTEROSCOPY WITH               MYOSURE;  Surgeon: Benjaman Kindler, MD;  Location: [facility]               ORS;  Service: Gynecology;  Laterality: N/A; 01/10/2019: HYSTEROSCOPY WITH D & C; N/A     Comment:  Procedure: DILATATION AND CURETTAGE /HYSTEROSCOPY;                 Surgeon: Benjaman Kindler, MD;  Location: [facility] ORS;                Service: Gynecology;  Laterality: N/A; OCT 2012: IUD REMOVAL 07/14/2011: LAPAROSCOPIC TUBAL LIGATION     Comment:  Procedure: LAPAROSCOPIC TUBAL LIGATION;  Surgeon: Agnes Lawrence, MD;  Location: [place] [facility];  Service:               Gynecology;  Laterality: N/A; No date: NO PAST SURGERIES 04/21/2011: TUBAL LIGATION     Comment:  Procedure: ESSURE TUBAL STERILIZATION;  Surgeon: Agnes Lawrence, MD;  Location: [place] [facility]S;  Service:               Gynecology;  Laterality: Bilateral;   attempted               essure,removal of IUD, hysteroscopy,         dilatation               and currettage No date: vaginal ablasion  BMI    Body Mass Index: 26.68 kg/m      Reproductive/Obstetrics negative OB ROS                            Anesthesia Physical  Anesthesia Plan  ASA: 2 and emergent  Anesthesia Plan: General ETT   Post-op Pain Management:    Induction: Intravenous  PONV Risk Score and Plan: 3 and Ondansetron, Dexamethasone, Midazolam and Treatment may vary due to age or medical condition  Airway Management Planned: Oral ETT  Additional Equipment:   Intra-op Plan:   Post-operative Plan: Extubation in OR  Informed Consent: I have reviewed the patients History and Physical, chart, labs and discussed the procedure including the risks, benefits and alternatives for the proposed anesthesia with the patient or authorized representative who has indicated his/her understanding and acceptance.     Dental Advisory Given  Plan Discussed with: Anesthesiologist, CRNA and Surgeon  Anesthesia Plan Comments: (Patient consented for risks of anesthesia including but not limited to:  - adverse reactions to medications - damage to eyes, teeth, lips or other oral mucosa - nerve damage due to positioning  - sore throat or hoarseness - Damage to heart, brain, nerves,  lungs, other parts of body or loss of life  Patient voiced understanding.)        Anesthesia Quick Evaluation

## 2022-05-11 NOTE — Consult Note (Signed)
Referring Physician:  No referring provider defined for this encounter.  Primary Physician:  Dion Body, MD  History of Present Illness: 05/11/2022 [name] is here today with a chief complaint of worsening mental status and speech over the past few days.  She has a known left-sided subdural hematoma that was treated with middle meningeal artery embolization on September 21.  This was performed at [facility].  Due to worsening mental status and neurologic symptoms, she presented to the emergency department for reevaluation.  Of note, she is on therapeutic anticoagulation for history of deep venous thrombosis and pulmonary embolism.  She reports headache as well as mild weakness on the right side.  She has some difficulty with producing speech.  The symptoms are causing a significant impact on the patient's life.   Review of Systems:  A 10 point review of systems is negative, except for the pertinent positives and negatives detailed in the HPI.  Past Medical History: Past Medical History:  Diagnosis Date   Diabetes mellitus without complication ([place])    DVT (deep venous thrombosis) ([place]) 06/08/2012   Hyperlipidemia    Hypertension    Personal history of venous thrombosis and embolism 04/19/2016   Pulmonary embolism 10 yrs ago 06/07/2012   Pulmonary embolism, blood-clot, obstetric 2002   on birth control-pulm blood clot-treated with coumadin-no problems now   SOB (shortness of breath) 06/07/2012    Past Surgical History: Past Surgical History:  Procedure Laterality Date   DILATATION & CURETTAGE/HYSTEROSCOPY WITH MYOSURE N/A 01/10/2019   Procedure: [place];  Surgeon: Benjaman Kindler, MD;  Location: [facility] ORS;  Service: Gynecology;  Laterality: N/A;   HYSTEROSCOPY WITH D & C N/A 01/10/2019   Procedure: DILATATION AND CURETTAGE /HYSTEROSCOPY;  Surgeon: Benjaman Kindler, MD;  Location: [facility] ORS;  Service: Gynecology;  Laterality:  N/A;   IUD REMOVAL  OCT 2012   LAPAROSCOPIC TUBAL LIGATION  07/14/2011   Procedure: LAPAROSCOPIC TUBAL LIGATION;  Surgeon: Agnes Lawrence, MD;  Location: [place] [facility];  Service: Gynecology;  Laterality: N/A;   NO PAST SURGERIES     TUBAL LIGATION  04/21/2011   Procedure: ESSURE TUBAL STERILIZATION;  Surgeon: Agnes Lawrence, MD;  Location: [place] [facility];  Service: Gynecology;  Laterality: Bilateral;   attempted essure,removal of IUD, hysteroscopy,         dilatation and currettage   vaginal ablasion      Allergies: Allergies as of 05/10/2022 - Review Complete 05/10/2022  Allergen Reaction Noted   Sulfa antibiotics Nausea Only 03/16/2011    Medications: Current Meds  Medication Sig   apixaban (ELIQUIS) 2.5 MG TABS tablet Take 2.5 mg by mouth 2 (two) times daily.   levETIRAcetam (KEPPRA) 500 MG tablet Take 500 mg by mouth 2 (two) times daily.   lisinopril (ZESTRIL) 5 MG tablet Take 5 mg by mouth daily.   lovastatin (MEVACOR) 20 MG tablet Take 20 mg by mouth at bedtime.   metFORMIN (GLUCOPHAGE) 500 MG tablet Take 500 mg by mouth daily with breakfast.    OZEMPIC, 1 MG/DOSE, 4 MG/3ML SOPN Inject into the skin.    Social History: Social History   Tobacco Use   Smoking status: Never   Smokeless tobacco: Never  Vaping Use   Vaping Use: Never used  Substance Use Topics   Alcohol use: No   Drug use: No    Family Medical History: Family History  Problem Relation Age of Onset   Diabetes Mother    Diabetes Father  Diabetes Sister     Physical Examination: Vitals:   05/11/22 0357 05/11/22 0556  BP: 138/66 (!) 126/44  Pulse: 76 76  Resp: 16 14  Temp: 98 F (36.7 C)   SpO2: 97% 96%    General: Patient is well developed, well nourished, calm, collected, and in no apparent distress. Attention to examination is appropriate.  Neck:   Supple.  Full range of motion.  Respiratory: Patient is breathing without any difficulty.   NEUROLOGICAL:     Awake, alert, oriented to  person, place, and November 2023.  She has some speech hesitation.  She is able to understand complex speech, but is having difficulty with production.  She has difficulty with repetition.  She is able to name 2 out of 3 objects.  Her fluency is impaired.  Fund of knowledge is appropriate.   Cranial Nerves: Pupils equal round and reactive to light.  Facial tone is symmetric.  Facial sensation is symmetric. Shoulder shrug is symmetric. Tongue protrusion is midline.  There is mild right pronator drift.    Strength: Side Biceps Triceps Deltoid Interossei Grip Wrist Ext. Wrist Flex.  R 4+ 4+ 4+ 4+ 4+ 5 5  L '5 5 5 5 5 5 5   '$ Side Iliopsoas Quads Hamstring PF DF EHL  R '5 5 5 5 5 5  '$ L '5 5 5 5 5 5    '$ Hoffman's is absent.   Bilateral upper and lower extremity sensation is intact to light touch.    No evidence of dysmetria noted.  Gait is untested.     Medical Decision Making  Imaging: CT Head 05/10/22 IMPRESSION: Redemonstrated left subdural collection, now predominantly hypodense and chronic, with unchanged mass effect and 6 mm of left-to-right midline shift. No hyperdense blood products to suggest acute hemorrhage.     Electronically Signed   By: Merilyn Baba M.D.   On: 05/10/2022 17:29  I have personally reviewed the images and agree with the above interpretation.  Assessment and Plan: Ms. Norton is a pleasant 56 y.o. female with left-sided chronic subdural hematoma that is causing significant symptoms.  She has brain compression from this mass lesion as well as midline shift from left to right.  She has approximately 6 mm of midline shift.  She has right-sided weakness as well as some speech difficulty which are consistent with her imaging findings.  Given her worsening neurologic performance and previous treatment with middle meningeal artery embolization, I recommended surgical treatment with bur hole drainage.  Unfortunate, I do not think that conservative management is  appropriate at this point given her worsening neurologic status.  We will move forward with surgical intervention this evening when she has been 48 hours since her last dose of apixaban.  She expressed understanding.  I discussed the planned procedure at length with the patient, including the risks, benefits, alternatives, and indications. The risks discussed include but are not limited to bleeding, infection, need for reoperation, spinal fluid leak, stroke, vision loss, anesthetic complication, coma, paralysis, and even death. I also described in detail that improvement was not guaranteed.  The patient expressed understanding of these risks, and asked that we proceed with surgery. I described the surgery in layman's terms, and gave ample opportunity for questions, which were answered to the best of my ability.       Terek Bee K. Izora Ribas MD, [facility] Neurosurgery    Level 5 - risk of urgent surgical procedure with recent anticoagulation

## 2022-05-11 NOTE — Progress Notes (Addendum)
Patient scheduled for surgery (burr holes) this PM; will hold PT until procedure complete SLP Cancellation Note  Patient Details Name: Bianca Simmons MRN: [id number] DOB: November 21, 1965   Cancelled treatment:        Chart reviewed. Patient scheduled for surgery (burr holes to relieve pressure) this PM; will hold off on ST eval until procedure complete and eval is appropriate     Bianca Simmons 05/11/2022, 12:50 PM

## 2022-05-11 NOTE — Anesthesia Procedure Notes (Signed)
Procedure Name: Intubation Date/Time: 05/11/2022 5:08 PM  Performed by: Jonna Clark, CRNAPre-anesthesia Checklist: Patient identified, Patient being monitored, Timeout performed, Emergency Drugs available and Suction available Patient Re-evaluated:Patient Re-evaluated prior to induction Oxygen Delivery Method: Circle system utilized Preoxygenation: Pre-oxygenation with 100% oxygen Induction Type: IV induction Ventilation: Mask ventilation without difficulty Laryngoscope Size: 3 and McGraph Grade View: Grade I Tube type: Oral Tube size: 7.0 mm Number of attempts: 1 Airway Equipment and Method: Stylet Placement Confirmation: ETT inserted through vocal cords under direct vision, positive ETCO2 and breath sounds checked- equal and bilateral Secured at: 21 cm Tube secured with: Tape Dental Injury: Teeth and Oropharynx as per pre-operative assessment

## 2022-05-11 NOTE — Transfer of Care (Signed)
Immediate Anesthesia Transfer of Care Note  Patient: Jeriah Skufca  Procedure(s) Performed: Haskell Flirt (Left: Head)  Patient Location: PACU  Anesthesia Type:General  Level of Consciousness: drowsy and patient cooperative  Airway & Oxygen Therapy: Patient Spontanous Breathing and Patient connected to nasal cannula oxygen  Post-op Assessment: Report given to RN and Post -op Vital signs reviewed and stable  Post vital signs: Reviewed and stable  Last Vitals:  Vitals Value Taken Time  BP 107/68 05/11/22 1858  Temp    Pulse 98 05/11/22 1859  Resp 17 05/11/22 1859  SpO2 99 % 05/11/22 1859  Vitals shown include unvalidated device data.  Last Pain:  Vitals:   05/11/22 1600  TempSrc: Oral  PainSc: 0-No pain         Complications: No notable events documented.

## 2022-05-11 NOTE — Interval H&P Note (Signed)
History and Physical Interval Note:  05/11/2022 4:35 PM  Bianca Simmons  has presented today for surgery, with the diagnosis of subdural hematoma.  The various methods of treatment have been discussed with the patient and family. After consideration of risks, benefits and other options for treatment, the patient has consented to  Procedure(s): BURR HOLES (Left) as a surgical intervention.  The patient's history has been reviewed, patient examined, no change in status, stable for surgery.  I have reviewed the patient's chart and labs.  Questions were answered to the patient's satisfaction.    Heart sounds normal no MRG. Chest Clear to Auscultation Bilaterally.   Nery Kalisz

## 2022-05-11 NOTE — Op Note (Signed)
Indications: Bianca Simmons is suffering from a chronic subdural hematoma causing significant brain compression and symptoms, prompting surgical intervention.   Findings: subdural hematoma  Preoperative Diagnosis: Subdural hematoma Postoperative Diagnosis: same   EBL: 25 ml IVF: 300 ml Drains: subdural drain placed Disposition: Extubated and Stable to PACU Complications: none  A foley catheter was placed.   Preoperative Note:   Risks of surgery discussed include: infection, bleeding, stroke, coma, death, paralysis, CSF leak, weakness, need for further surgery, persistent symptoms, and the risks of anesthesia. The patient understood these risks and agreed to proceed.  Operative Note:   Procedure:  1) Left frontal burr hole for drainage of subdural hematoma   Procedure: After obtaining informed consent, the patient taken to the operating room, placed in supine position, general anesthesia induced.  His head was placed on a horseshoe.  A linear incision was marked 6 cm lateral to the midline on the left on the coronal suture.  The hair was clipped.  The operative site was prepped and draped.  A timeout was performed.    The incision was injected with local.  A linear incision was made and carried to the skull.  The pericranium was reflected laterally and a small self-retaining retractor placed.  The drill was used to make a burr hole.  The dura was coagulated, then divided with a #15 blade.  A membrane was encountered and opened.    Dark brown subdural fluid was encountered.  A bactiseal ventriculostomy catheter was placed into the subdural space, then tunneled medially and posteriorly approximately 5 cm from the incision.  The connector was placed and secured.  Brisk drainage was noted.  The primary incision was irrigated, then closed with vicryl and monocryl.  The drain was secured to the skin, then a 3-0 monocryl was placed for future closure of the drainage hole, and tacked to  the drain with a steristrip.  The drainage system was flushed, then attached to the drain.  Good drainage was noted.    Sterile dressings were placed.  Sponge and pattie counts were correct at the end of the procedure.     Meade Maw MD

## 2022-05-11 NOTE — Progress Notes (Signed)
[facility]  PROGRESS NOTE  Tanis Hensarling RDE:[id number] DOB: 02/09/1966 DOA: 05/10/2022 PCP: Dion Body, MD   Brief HPI:   56 year old female with a history of DVT, pulmonary embolism, hypertension, hyperlipidemia who is on anticoagulation with Eliquis presented with altered mental status and slurred speech.  Patient apparently had fallen last month and developed headache, which turned out to be subdural hematoma.  She was admitted to [facility] on 04/20/2022, underwent embolization of left middle meningeal artery and was discharged on 04/22/2022.  Postoperatively she had no change in the CT head so she was restarted on Eliquis. Now she presented with slurred speech and altered mental status. CT imaging showed left subdural hematoma with unchanged mass effect and 6 mm left-to-right midline shift.  Neurosurgery was consulted.    Subjective   Patient seen and examined, denies pain.   Assessment/Plan:     Subdural hematoma/altered mental status -CT head confirmed subdural hematoma; likely recurrence of hematoma from anticoagulation -We will continue to hold Eliquis -Neuro surgery consulted; likely bur hole procedure today -Maintain systolic blood pressure between 130s to 140s -We will keep patient n.p.o.      Medications      stroke: early stages of recovery book   Does not apply Once   insulin aspart  0-9 Units Subcutaneous TID WC   pantoprazole (PROTONIX) IV  40 mg Intravenous Q12H     Data Reviewed:   CBG:  Recent Labs  Lab 05/10/22 1709 05/11/22 0536  GLUCAP 70 95    SpO2: 96 %    Vitals:   05/10/22 2003 05/10/22 2319 05/11/22 0357 05/11/22 0556  BP: (!) 135/58 138/60 138/66 (!) 126/44  Pulse: 62 62 76 76  Resp: '18 16 16 14  '$ Temp: 98.1 F (36.7 C) 97.9 F (36.6 C) 98 F (36.7 C)   TempSrc: Oral Oral    SpO2: 100% 98% 97% 96%  Weight:          Data Reviewed:  Basic Metabolic Panel: Recent Labs  Lab 05/10/22 1707 05/10/22 2300  05/11/22 0441  NA 141 141 142  K 3.7 3.5 3.7  CL 107 108 109  CO2 '26 26 27  '$ GLUCOSE 93 95 97  BUN '13 10 9  '$ CREATININE 0.60 0.57 0.61  CALCIUM 9.0 8.7* 8.7*  MG  --  1.7 1.8  PHOS  --  4.5 5.4*    CBC: Recent Labs  Lab 05/10/22 1707 05/10/22 2300 05/11/22 0441  WBC 7.0 5.2 4.4  NEUTROABS 3.9 2.4 1.9  HGB 11.8* 10.1* 10.1*  HCT 38.1 31.5* 31.7*  MCV 90.7 88.0 89.5  PLT 240 227 210    LFT Recent Labs  Lab 05/10/22 2300 05/11/22 0441  AST 29 31  ALT 31 33  ALKPHOS 71 70  BILITOT 0.8 0.7  PROT 6.2* 6.1*  ALBUMIN 3.0* 3.0*     Antibiotics: Anti-infectives (From admission, onward)    None        DVT prophylaxis: SCDs  Code Status: Full code  Family Communication:    CONSULTS neurosurgery   Objective    Physical Examination:  General-appears in no acute distress Heart-S1-S2, regular, no murmur auscultated Lungs-clear to auscultation bilaterally, no wheezing or crackles auscultated Abdomen-soft, nontender, no organomegaly Extremities-no edema in the lower extremities Neuro-alert, oriented x3, no focal deficit noted  Status is: Inpatient:             [place]   [facility] If 7PM-7AM, please contact night-coverage at [email or web address], Office  [phone]  05/11/2022, 8:02 AM  LOS: 1 day

## 2022-05-11 NOTE — Progress Notes (Signed)
OT Cancellation Note  Patient Details Name: Bianca Simmons MRN: [id number] DOB: Jul 17, 1966   Cancelled Treatment:    Reason Eval/Treat Not Completed: Medical issues which prohibited therapy. Patient scheduled for surgical evacuation (burr holes) of SDH this PM; will hold OT until procedure complete and patient appropriate for evaluation.)  Darleen Crocker, MS, OTR/L , CBIS ascom [phone]  05/11/22, 8:52 AM

## 2022-05-11 NOTE — Telephone Encounter (Signed)
Dr Izora Ribas has added her on for burr holes today. Per Dr Izora Ribas, she needs an appointment in 2 weeks and 4 weeks. He would like her to have a CT scan right before the 4 week appointment.  We will place the CT order and have it scheduled once she is discharged.  *Dr Izora Ribas said we do not need to powershare the imaging from [facility].

## 2022-05-12 ENCOUNTER — Inpatient Hospital Stay: Payer: BC Managed Care – PPO

## 2022-05-12 DIAGNOSIS — S065XAA Traumatic subdural hemorrhage with loss of consciousness status unknown, initial encounter: Secondary | ICD-10-CM | POA: Diagnosis not present

## 2022-05-12 LAB — HIV ANTIBODY (ROUTINE TESTING W REFLEX): HIV Screen 4th Generation wRfx: NONREACTIVE

## 2022-05-12 LAB — GLUCOSE, CAPILLARY
Glucose-Capillary: 175 mg/dL — ABNORMAL HIGH (ref 70–99)
Glucose-Capillary: 136 mg/dL — ABNORMAL HIGH (ref 70–99)
Glucose-Capillary: 133 mg/dL — ABNORMAL HIGH (ref 70–99)
Glucose-Capillary: 124 mg/dL — ABNORMAL HIGH (ref 70–99)
Glucose-Capillary: 157 mg/dL — ABNORMAL HIGH (ref 70–99)
Glucose-Capillary: 143 mg/dL — ABNORMAL HIGH (ref 70–99)

## 2022-05-12 MED ORDER — OXYCODONE HCL 5 MG PO TABS: 5.0000 mg | ORAL_TABLET | ORAL | Status: DC | PRN

## 2022-05-12 MED ORDER — LEVETIRACETAM 500 MG PO TABS
500.0000 mg | ORAL_TABLET | Freq: Two times a day (BID) | ORAL | Status: DC
  Administered 2022-05-12 – 2022-05-13 (×2): 500 mg via ORAL
  Filled 2022-05-12 (×2): qty 1

## 2022-05-12 MED ORDER — ENOXAPARIN SODIUM 30 MG/0.3ML IJ SOSY
30.0000 mg | PREFILLED_SYRINGE | Freq: Two times a day (BID) | INTRAMUSCULAR | Status: DC
  Administered 2022-05-12 – 2022-05-13 (×2): 30 mg via SUBCUTANEOUS
  Filled 2022-05-12 (×2): qty 0.3

## 2022-05-12 NOTE — Anesthesia Postprocedure Evaluation (Signed)
Anesthesia Post Note  Patient: Bianca Simmons  Procedure(s) Performed: Haskell Flirt (Left: Head)  Patient location during evaluation: ICU Anesthesia Type: General Level of consciousness: awake and oriented Pain management: pain level controlled Vital Signs Assessment: post-procedure vital signs reviewed and stable Respiratory status: spontaneous breathing, respiratory function stable and nonlabored ventilation Cardiovascular status: blood pressure returned to baseline and stable Anesthetic complications: no   No notable events documented.   Last Vitals:  Vitals:   05/12/22 0230 05/12/22 0300  BP: (!) 118/57 (!) 113/51  Pulse: 84 89  Resp: 14 (!) 25  Temp:    SpO2: 97% 96%    Last Pain:  Vitals:   05/12/22 0000  TempSrc: Oral  PainSc:                  Johnna Acosta

## 2022-05-12 NOTE — Discharge Instructions (Signed)
  Your surgeon has performed an operation on your head to remove fluid pressing on your brain.   The following are instructions to help in your recovery once you have been discharged from the hospital. Even if you feel well, it is important that you follow these activity guidelines. If you do not let yourself heal properly from the surgery, you can increase the chance of return of your symptoms and other complications.  * Do not take anti-inflammatory medications for 2 weeks after surgery (naproxen [Aleve], ibuprofen [Advil, Motrin], etc.). These medications can prevent your bones from healing properly.   Activity    No bending, lifting, or twisting ("BLT"). Avoid lifting objects heavier than 10 pounds (gallon milk jug).  Where possible, avoid household activities that involve lifting, bending, reaching, pushing, or pulling such as laundry, vacuuming, grocery shopping, and childcare. Try to arrange for help from friends and family for these activities while your back heals.  Increase physical activity slowly as tolerated.  Taking short walks is encouraged, but avoid strenuous exercise. Do not jog, run, bicycle, lift weights, or participate in any other exercises unless specifically allowed by your doctor.  Talk to your doctor before resuming sexual activity.  You should not drive until cleared by your doctor.  Until released by your doctor, you should not return to work or school.  You should rest at home and let your body heal.   You may wash your hair five days after your surgery.  After showering, lightly dab your incision dry. Do not take a tub bath or go swimming until approved by your doctor at your follow-up appointment.  If you smoke, we strongly recommend that you quit.  Smoking has been proven to interfere with normal bone healing and will dramatically reduce the success rate of your surgery. Please contact [facility] (800-QUIT-NOW) and use the resources at www.[facility].com for  assistance in stopping smoking.  Surgical Incision   There are dissolvable stitches on your scalp.  These will take 6-8 weeks to dissolve.   Diet           You may return to your usual diet.   When to Contact us  Should you experience any of the following, contact us immediately: New numbness or weakness Pain that is progressively getting worse, and is not relieved by your pain medication, muscle relaxers, rest, and warm compresses Bleeding, redness, swelling, pain, or drainage from surgical incision Chills or flu-like symptoms Fever greater than 101.0 F (38.3 C) Inability to eat, drink fluids, or take medications Problems with bowel or bladder functions Difficulty breathing or shortness of breath Warmth, tenderness, or swelling in your calf Contact Information During office hours (Monday-Friday 9 am to 5 pm), please call your physician at [phone] and ask for Berdine Addison After hours and weekends, please call [phone] and speak with the neurosurgeon on call For a life-threatening emergency, call 911

## 2022-05-12 NOTE — Progress Notes (Signed)
    Attending Progress Note  History: Bianca Simmons is here for subdural hematoma.  POD1: Doing well.  Speech still slowed.  Physical Exam: Vitals:   05/12/22 0700 05/12/22 0800  BP: (!) 107/52 (!) 103/40  Pulse: 76 76  Resp: 15 14  Temp:  98.1 F (36.7 C)  SpO2: 98% 98%    AA Ox3 CNI Speech slow but fluent.  Strength: MAEW, slightly weaker on R  Data:  Recent Labs  Lab 05/10/22 1707 05/10/22 2300 05/11/22 0441  NA 141 141 142  K 3.7 3.5 3.7  CL 107 108 109  CO2 '26 26 27  '$ BUN '13 10 9  '$ CREATININE 0.60 0.57 0.61  GLUCOSE 93 95 97  CALCIUM 9.0 8.7* 8.7*   Recent Labs  Lab 05/11/22 0441  AST 31  ALT 33  ALKPHOS 70     Recent Labs  Lab 05/10/22 1707 05/10/22 2300 05/11/22 0441  WBC 7.0 5.2 4.4  HGB 11.8* 10.1* 10.1*  HCT 38.1 31.5* 31.7*  PLT 240 227 210   Recent Labs  Lab 05/10/22 1707  INR 1.2         Other tests/results: CT Head - improved fluid collection  Assessment/Plan:  Bianca Simmons is doing well after L burr hole  - mobilize - pain control - DVT prophylaxis - PTOT - Drain removed   Meade Maw MD, [facility] Department of Neurosurgery

## 2022-05-12 NOTE — Progress Notes (Signed)
[facility]  PROGRESS NOTE  Bianca Simmons MCN:[id number] DOB: 07-21-1966 DOA: 05/10/2022 PCP: Dion Body, MD   Brief HPI:   56 year old female with a history of DVT, pulmonary embolism, hypertension, hyperlipidemia who is on anticoagulation with Eliquis presented with altered mental status and slurred speech.  Patient apparently had fallen last month and developed headache, which turned out to be subdural hematoma.  She was admitted to [facility] on 04/20/2022, underwent embolization of left middle meningeal artery and was discharged on 04/22/2022.  Postoperatively she had no change in the CT head so she was restarted on Eliquis. Now she presented with slurred speech and altered mental status. CT imaging showed left subdural hematoma with unchanged mass effect and 6 mm left-to-right midline shift.  Neurosurgery was consulted.    Subjective   Patient seen and examined, s/p burr hole procedure for subdural hematoma.   Assessment/Plan:     Subdural hematoma/altered mental status -CT head confirmed subdural hematoma; likely recurrence of hematoma from anticoagulation -We will continue to hold Eliquis -Neuro surgery consulted; s/p burr hole procedure -Patient improving -Neurosurgery plan to start DVT prophylaxis from tonight, mobilize and discharge home in a.m. Continue Keppra for seizure prophylaxis    Medications      stroke: early stages of recovery book   Does not apply Once   Chlorhexidine Gluconate Cloth  6 each Topical Daily   enoxaparin (LOVENOX) injection  30 mg Subcutaneous Q12H   insulin aspart  0-9 Units Subcutaneous TID WC   levETIRAcetam  500 mg Oral BID   pantoprazole (PROTONIX) IV  40 mg Intravenous Q12H     Data Reviewed:   CBG:  Recent Labs  Lab 05/11/22 1953 05/11/22 2350 05/12/22 0407 05/12/22 0742 05/12/22 1156  GLUCAP 108* 145* 175* 136* 133*    SpO2: 98 % O2 Flow Rate (L/min): 3 L/min    Vitals:   05/12/22 1000 05/12/22 1100  05/12/22 1200 05/12/22 1300  BP: (!) 131/104 (!) 104/46 (!) 103/59 (!) 93/51  Pulse: 93 76 90   Resp:      Temp:      TempSrc:      SpO2: 96% 98% 98%   Weight:      Height:          Data Reviewed:  Basic Metabolic Panel: Recent Labs  Lab 05/10/22 1707 05/10/22 2300 05/11/22 0441  NA 141 141 142  K 3.7 3.5 3.7  CL 107 108 109  CO2 '26 26 27  '$ GLUCOSE 93 95 97  BUN '13 10 9  '$ CREATININE 0.60 0.57 0.61  CALCIUM 9.0 8.7* 8.7*  MG  --  1.7 1.8  PHOS  --  4.5 5.4*    CBC: Recent Labs  Lab 05/10/22 1707 05/10/22 2300 05/11/22 0441  WBC 7.0 5.2 4.4  NEUTROABS 3.9 2.4 1.9  HGB 11.8* 10.1* 10.1*  HCT 38.1 31.5* 31.7*  MCV 90.7 88.0 89.5  PLT 240 227 210    LFT Recent Labs  Lab 05/10/22 2300 05/11/22 0441  AST 29 31  ALT 31 33  ALKPHOS 71 70  BILITOT 0.8 0.7  PROT 6.2* 6.1*  ALBUMIN 3.0* 3.0*     Antibiotics: Anti-infectives (From admission, onward)    None        DVT prophylaxis: SCDs  Code Status: Full code  Family Communication:    CONSULTS neurosurgery   Objective    Physical Examination:  General-appears in no acute distress Heart-S1-S2, regular, no murmur auscultated Lungs-clear to auscultation bilaterally, no wheezing or  crackles auscultated Abdomen-soft, nontender, no organomegaly Extremities-no edema in the lower extremities Neuro-alert, oriented x3, no focal deficit noted  Status is: Inpatient:             Oswald Hillock   [facility] If 7PM-7AM, please contact night-coverage at [email or web address], Office  [phone]   05/12/2022, 2:08 PM  LOS: 2 days

## 2022-05-12 NOTE — Evaluation (Signed)
Occupational Therapy Evaluation Patient Details Name: Bianca Simmons MRN: [id number] DOB: Nov 08, 1965 Today's Date: 05/12/2022   History of Present Illness Patient is a 56 year old with chief complaint of worsening mental status and speech over the past few days with a known left sided SDH that was treated with middle meningeal artery embolization on September 21 at [facility].  Status post Left frontal burr hole for drainage of subdural hematoma   Clinical Impression   Patient presenting with decreased independence in self-care, functional mobility, and safety. Patient in bed with husband present and assisting in providing PLOF and home set-up. Reporting she lives at home with husband in 2 level home, 12 step inside home railings on both sides. Patient does not drive and reports independent at baseline.  Expressive communication deficits observed, requiring increased time to follow commands. Patient currently functioning at supervision for bed mobility, supervision/ min guard for transfer sit >stand using RW. Patient was able to ambulate ~8 ft to [facility], requiring min VC to utilize RW. Patient transferred to [facility] and perform peri-care  LB dressing with supervision. Grooming tasks perform at sink while standing with supervision. Patient left in recliner with call bell in reach and all needs met. Patient will benefit from acute OT to increase overall independence in the areas of ADLs, functional mobility, in order to safely discharge home.       Recommendations for follow up therapy are one component of a multi-disciplinary discharge planning process, led by the attending physician.  Recommendations may be updated based on patient status, additional functional criteria and insurance authorization.   Follow Up Recommendations  Home health OT    Assistance Recommended at Discharge Intermittent Supervision/Assistance  Patient can return home with the following A little help with walking and/or transfers;A little  help with bathing/dressing/bathroom;Assistance with cooking/housework;Help with stairs or ramp for entrance;Assist for transportation    Functional Status Assessment  Patient has had a recent decline in their functional status and/or demonstrates limited ability to make significant improvements in function in a reasonable and predictable amount of time  Equipment Recommendations  None recommended by OT       Precautions / Restrictions Precautions Precautions: Fall Restrictions Weight Bearing Restrictions: No      Mobility Bed Mobility Overal bed mobility: Needs Assistance Bed Mobility: Supine to Sit     Supine to sit: Supervision          Transfers Overall transfer level: Needs assistance Equipment used: Rolling walker (2 wheels) Transfers: Sit to/from Stand Sit to Stand: Supervision, Min guard           General transfer comment: verbal cues for safe hand placement      Balance Overall balance assessment: Needs assistance Sitting-balance support: Feet supported Sitting balance-Leahy Scale: Good     Standing balance support: Bilateral upper extremity supported Standing balance-Leahy Scale: Fair                             ADL either performed or assessed with clinical judgement   ADL Overall ADL's : Needs assistance/impaired     Grooming: Wash/dry face;Oral care;Supervision/safety               Lower Body Dressing: Supervision/safety;Min guard   Toilet Transfer: Supervision/safety   Toileting- Clothing Manipulation and Hygiene: Supervision/safety                Pertinent Vitals/Pain Pain Assessment Faces Pain Scale: Hurts a little bit Pain  Location: headache Pain Descriptors / Indicators: Headache Pain Intervention(s): Limited activity within patient's tolerance, Monitored during session, Repositioned     Hand Dominance Right   Extremity/Trunk Assessment Upper Extremity Assessment Upper Extremity Assessment: Overall  WFL for tasks assessed   Lower Extremity Assessment Lower Extremity Assessment: Overall WFL for tasks assessed       Communication Communication Communication: Expressive difficulties   Cognition Arousal/Alertness: Awake/alert Behavior During Therapy: WFL for tasks assessed/performed, Flat affect Overall Cognitive Status: Impaired/Different from baseline Area of Impairment: Following commands, Problem solving                       Following Commands: Follows one step commands with increased time     Problem Solving: Slow processing, Decreased initiation, Difficulty sequencing, Requires verbal cues       General Comments  vitals stable throughout session. blood pressure 117/57 with standing.            Home Living Family/patient expects to be discharged to:: Private residence Living Arrangements: Spouse/significant other Available Help at Discharge: Family;Available PRN/intermittently Type of Home: House Home Access: Level entry     Home Layout: Two level;Bed/bath upstairs     Bathroom Shower/Tub: Teacher, early years/pre: Standard                Prior Functioning/Environment Prior Level of Function : Independent/Modified Independent                        OT Problem List: Decreased strength;Decreased safety awareness;Decreased activity tolerance      OT Treatment/Interventions: Self-care/ADL training;Therapeutic exercise;Therapeutic activities;Patient/family education    OT Goals(Current goals can be found in the care plan section) Acute Rehab OT Goals Patient Stated Goal: to return home OT Goal Formulation: With patient Time For Goal Achievement: 05/26/22 Potential to Achieve Goals: Good ADL Goals Pt Will Perform Lower Body Bathing: with modified independence Pt Will Perform Lower Body Dressing: with modified independence Pt Will Transfer to Toilet: with modified independence Pt Will Perform Toileting - Clothing  Manipulation and hygiene: with modified independence  OT Frequency: Min 2X/week       AM-PAC OT "6 Clicks" Daily Activity     Outcome Measure   Help from another person taking care of personal grooming?: None Help from another person toileting, which includes using toliet, bedpan, or urinal?: None Help from another person bathing (including washing, rinsing, drying)?: A Little Help from another person to put on and taking off regular upper body clothing?: None Help from another person to put on and taking off regular lower body clothing?: None 6 Click Score: 19   End of Session Equipment Utilized During Treatment: Rolling walker (2 wheels) Nurse Communication: Mobility status  Activity Tolerance: Patient tolerated treatment well Patient left: in chair;with call bell/phone within reach  OT Visit Diagnosis: Unsteadiness on feet (R26.81)                Time: 1224-4975 OT Time Calculation (min): 29 min Charges:  OT General Charges $OT Visit: 1 Visit OT Evaluation $OT Eval Low Complexity: 1 Low OT Treatments $Self Care/Home Management : 8-22 mins    Cherrie Franca, OTS 05/12/2022, 2:22 PM

## 2022-05-12 NOTE — Evaluation (Signed)
Physical Therapy Evaluation Patient Details Name: Bianca Simmons MRN: [id number] DOB: 02/01/1966 Today's Date: 05/12/2022  History of Present Illness  Patient is a 56 year old with chief complaint of worsening mental status and speech over the past few days with a known left sided SDH that was treated with middle meningeal artery embolization on September 21 at [facility].  Status post Left frontal burr hole for drainage of subdural hematoma.    Clinical Impression  Patient is agreeable to PT evaluation. She reports she is independent at baseline and lives at home with her spouse in a 2 level home with bedroom upstairs. The patient has expressive communication deficits today and increased time required for processing. She was able to follow single step commands with increased time.  Vitals stable throughout session. She ambulated with and without rolling walker with improved standing balance and step length with using rolling walker. Recommend to continue PT to maximize independence and facilitate return to prior level of function. Anticipate patient can return home with HHPT and family assistance.       Recommendations for follow up therapy are one component of a multi-disciplinary discharge planning process, led by the attending physician.  Recommendations may be updated based on patient status, additional functional criteria and insurance authorization.  Follow Up Recommendations Home health PT      Assistance Recommended at Discharge Intermittent Supervision/Assistance  Patient can return home with the following  A little help with walking and/or transfers;A little help with bathing/dressing/bathroom;Assist for transportation;Help with stairs or ramp for entrance;Assistance with cooking/housework    Equipment Recommendations Rolling walker (2 wheels)  Recommendations for Other Services       Functional Status Assessment Patient has had a recent decline in their functional status and  demonstrates the ability to make significant improvements in function in a reasonable and predictable amount of time.     Precautions / Restrictions Precautions Precautions: Fall Restrictions Weight Bearing Restrictions: No      Mobility  Bed Mobility Overal bed mobility: Needs Assistance Bed Mobility: Supine to Sit, Sit to Supine     Supine to sit: Min guard Sit to supine: Supervision   General bed mobility comments: verbal cues for initiation of tasks    Transfers Overall transfer level: Needs assistance Equipment used: None Transfers: Sit to/from Stand Sit to Stand: Min guard           General transfer comment: verbal cues for safe hand placement    Ambulation/Gait Ambulation/Gait assistance: Min guard, Min assist Gait Distance (Feet): 15 Feet (x 2 bouts) Assistive device: Rolling walker (2 wheels), None Gait Pattern/deviations: Step-through pattern, Decreased stride length, Narrow base of support Gait velocity: decreased     General Gait Details: patient ambulated first without device, requiring steadying assistance for walking. recommended to trial rolling walker for safety with improved balance and step length. cues for proper use of rolling walker. recommend to use rolling walker for ambulation for safety at this time  Stairs            Wheelchair Mobility    Modified Rankin (Stroke Patients Only)       Balance Overall balance assessment: Needs assistance Sitting-balance support: Feet supported Sitting balance-Leahy Scale: Good     Standing balance support: No upper extremity supported Standing balance-Leahy Scale: Fair Standing balance comment: no external support with static standing. dynamic balance required rolling walker for support  Pertinent Vitals/Pain Pain Assessment Pain Assessment: Faces Faces Pain Scale: Hurts a little bit Pain Location: headache Pain Descriptors / Indicators:  Headache Pain Intervention(s): Limited activity within patient's tolerance, Monitored during session, Repositioned    Home Living Family/patient expects to be discharged to:: Private residence Living Arrangements: Spouse/significant other Available Help at Discharge: Family Type of Home: House         Home Layout: Two level;Bed/bath upstairs        Prior Function Prior Level of Function : Independent/Modified Independent                     Hand Dominance   Dominant Hand: Right    Extremity/Trunk Assessment   Upper Extremity Assessment Upper Extremity Assessment: Overall WFL for tasks assessed    Lower Extremity Assessment Lower Extremity Assessment: Overall WFL for tasks assessed (BLE- 5/5 strength knee extension, plantarflexion, dorsiflexion)       Communication   Communication: Expressive difficulties  Cognition Arousal/Alertness: Awake/alert Behavior During Therapy: WFL for tasks assessed/performed, Flat affect Overall Cognitive Status: Impaired/Different from baseline Area of Impairment: Following commands, Problem solving                       Following Commands: Follows one step commands with increased time     Problem Solving: Slow processing, Decreased initiation, Difficulty sequencing, Requires verbal cues          General Comments General comments (skin integrity, edema, etc.): vitals stable throughout session. blood pressure 117/57 with standing.    Exercises     Assessment/Plan    PT Assessment Patient needs continued PT services  PT Problem List Decreased balance;Decreased activity tolerance;Decreased mobility;Decreased cognition;Decreased safety awareness       PT Treatment Interventions DME instruction;Gait training;Stair training;Functional mobility training;Therapeutic activities;Therapeutic exercise;Balance training;Cognitive remediation;Neuromuscular re-education;Patient/family education    PT Goals (Current goals  can be found in the Care Plan section)  Acute Rehab PT Goals Patient Stated Goal: to go home PT Goal Formulation: With patient Time For Goal Achievement: 05/26/22 Potential to Achieve Goals: Good    Frequency 7X/week     Co-evaluation               AM-PAC PT "6 Clicks" Mobility  Outcome Measure Help needed turning from your back to your side while in a flat bed without using bedrails?: A Little Help needed moving from lying on your back to sitting on the side of a flat bed without using bedrails?: A Little Help needed moving to and from a bed to a chair (including a wheelchair)?: A Little Help needed standing up from a chair using your arms (e.g., wheelchair or bedside chair)?: A Little Help needed to walk in hospital room?: A Little Help needed climbing 3-5 steps with a railing? : A Lot 6 Click Score: 17    End of Session Equipment Utilized During Treatment: Gait belt Activity Tolerance: Patient tolerated treatment well Patient left: in bed;with call bell/phone within reach;with SCD's reapplied (set-up with breakfast tray) Nurse Communication: Mobility status PT Visit Diagnosis: Other abnormalities of gait and mobility (R26.89);Unsteadiness on feet (R26.81)    Time: 2297-9892 PT Time Calculation (min) (ACUTE ONLY): 29 min   Charges:   PT Evaluation $PT Eval Moderate Complexity: 1 Mod PT Treatments $Gait Training: 8-22 mins        Minna Merritts, PT, MPT   Percell Locus 05/12/2022, 11:30 AM

## 2022-05-12 NOTE — Evaluation (Signed)
Speech Language Pathology Evaluation Patient Details Name: Sumeya Yontz MRN: [id number] DOB: Jan 24, 1966 Today's Date: 05/12/2022 Time: 6010-9323 SLP Time Calculation (min) (ACUTE ONLY): 35 min  Problem List:  Patient Active Problem List   Diagnosis Date Noted   Anemia 05/10/2022   Altered mental status 05/10/2022   Slurred speech 05/10/2022   Subdural hematoma (HCC) 04/14/2022   Obesity (BMI 35.0-39.9 without comorbidity) 06/24/2017   Type 2 diabetes mellitus with hyperlipidemia ([place]) 02/04/2016   Pulmonary embolism, bilateral ([place]) 06/07/2012   Abnormal uterine bleeding 07/14/2011   Contraceptive management 04/21/2011   Past Medical History:  Past Medical History:  Diagnosis Date   Diabetes mellitus without complication ([place])    DVT (deep venous thrombosis) ([place]) 06/08/2012   Hyperlipidemia    Hypertension    Personal history of venous thrombosis and embolism 04/19/2016   Pulmonary embolism 10 yrs ago 06/07/2012   Pulmonary embolism, blood-clot, obstetric 2002   on birth control-pulm blood clot-treated with coumadin-no problems now   SOB (shortness of breath) 06/07/2012   Past Surgical History:  Past Surgical History:  Procedure Laterality Date   DILATATION & CURETTAGE/HYSTEROSCOPY WITH MYOSURE N/A 01/10/2019   Procedure: DILATATION & CURETTAGE/HYSTEROSCOPY WITH MYOSURE;  Surgeon: Benjaman Kindler, MD;  Location: [facility] ORS;  Service: Gynecology;  Laterality: N/A;   HYSTEROSCOPY WITH D & C N/A 01/10/2019   Procedure: DILATATION AND CURETTAGE /HYSTEROSCOPY;  Surgeon: Benjaman Kindler, MD;  Location: [facility] ORS;  Service: Gynecology;  Laterality: N/A;   IUD REMOVAL  OCT 2012   LAPAROSCOPIC TUBAL LIGATION  07/14/2011   Procedure: LAPAROSCOPIC TUBAL LIGATION;  Surgeon: Agnes Lawrence, MD;  Location: [place] [facility];  Service: Gynecology;  Laterality: N/A;   NO PAST SURGERIES     TUBAL LIGATION  04/21/2011   Procedure: ESSURE TUBAL STERILIZATION;  Surgeon: Agnes Lawrence, MD;   Location: [facility];  Service: Gynecology;  Laterality: Bilateral;   attempted essure,removal of IUD, hysteroscopy,         dilatation and currettage   vaginal ablasion     HPI:  Patient is a 56 year old with chief complaint of worsening mental status and speech over the past few days with a known left sided SDH that was treated with middle meningeal artery embolization on September 21 at [facility].  Status post Left frontal burr hole for drainage of subdural hematoma   Assessment / Plan / Recommendation Clinical Impression  Pt presents with mild nonfluent expressive aphasia that is c/b moderate word finding deficits. As a result, pt's ability to communicate basic wants/needs is impaired.  Pt is stimulable with use of sentence completion cues. Recommend Outpatient ST services.    SLP Assessment  SLP Recommendation/Assessment: Patient needs continued Speech [place] [facility] SLP Visit Diagnosis: Aphasia (R47.01)    Recommendations for follow up therapy are one component of a multi-disciplinary discharge planning process, led by the attending physician.  Recommendations may be updated based on patient status, additional functional criteria and insurance authorization.    Follow Up Recommendations  Outpatient SLP    Assistance Recommended at Discharge  Intermittent Supervision/Assistance  Functional Status Assessment Patient has had a recent decline in their functional status and demonstrates the ability to make significant improvements in function in a reasonable and predictable amount of time.  Frequency and Duration min 2x/week  2 weeks      SLP Evaluation Cognition  Overall Cognitive Status: Within Functional Limits for tasks assessed Orientation Level: Oriented X4       Comprehension  Auditory Comprehension  Overall Auditory Comprehension: Appears within functional limits for tasks assessed Visual Recognition/Discrimination Discrimination: Within Function Limits Reading  Comprehension Reading Status: Not tested    Expression Expression Primary Mode of Expression: Verbal Verbal Expression Overall Verbal Expression: Impaired Initiation: No impairment Automatic Speech: Name;Social Response Level of Generative/Spontaneous Verbalization: Phrase Repetition: No impairment Naming: Impairment Responsive: 76-100% accurate Confrontation: Within functional limits Convergent: 50-74% accurate Divergent: 50-74% accurate Pragmatics: Impairment Impairments: Abnormal affect Effective Techniques: Open ended questions;Semantic cues Non-Verbal Means of Communication: Not applicable Written Expression Dominant Hand: Right Written Expression: Not tested   Oral / Motor  Motor Speech Overall Motor Speech: Appears within functional limits for tasks assessed Respiration: Within functional limits Phonation: Normal Resonance: Within functional limits Articulation: Within functional limitis Intelligibility: Intelligible Motor Planning: Witnin functional limits Motor Speech Errors: Not applicable           Dierks Wach B. Rutherford Nail, M.S., CCC-SLP, Mining engineer Certified Brain Injury [place]  [place] Office [phone] Ascom [phone] Fax [phone]

## 2022-05-13 ENCOUNTER — Inpatient Hospital Stay: Payer: BC Managed Care – PPO

## 2022-05-13 DIAGNOSIS — S065XAA Traumatic subdural hemorrhage with loss of consciousness status unknown, initial encounter: Secondary | ICD-10-CM | POA: Diagnosis not present

## 2022-05-13 LAB — GLUCOSE, CAPILLARY
Glucose-Capillary: 132 mg/dL — ABNORMAL HIGH (ref 70–99)
Glucose-Capillary: 127 mg/dL — ABNORMAL HIGH (ref 70–99)
Glucose-Capillary: 122 mg/dL — ABNORMAL HIGH (ref 70–99)

## 2022-05-13 MED ORDER — LEVETIRACETAM 500 MG PO TABS: 500.0000 mg | ORAL_TABLET | Freq: Two times a day (BID) | ORAL | 3 refills | Status: DC

## 2022-05-13 MED ORDER — METFORMIN HCL 500 MG PO TABS: 500.0000 mg | ORAL_TABLET | Freq: Every day | ORAL | 2 refills | Status: DC

## 2022-05-13 NOTE — Progress Notes (Signed)
    Attending Progress Note  History: Bianca Simmons is here for subdural hematoma.  POD2: Overnight, had some confusion, CT head stable. She states she feels speech and strength is good this morning.   Physical Exam: Vitals:   05/13/22 0900 05/13/22 1000  BP: (!) 118/56 (!) 108/59  Pulse: 64 76  Resp: 16 15  Temp:    SpO2: 97% 97%    AA Ox3 CNI Speech slow but intact naming and repetition, some difficulty recall  Strength: 4+/ 5 in RUE but 5/5 in LUE and BLE Dressing dry  Data:  Recent Labs  Lab 05/10/22 1707 05/10/22 2300 05/11/22 0441  NA 141 141 142  K 3.7 3.5 3.7  CL 107 108 109  CO2 '26 26 27  '$ BUN '13 10 9  '$ CREATININE 0.60 0.57 0.61  GLUCOSE 93 95 97  CALCIUM 9.0 8.7* 8.7*   Recent Labs  Lab 05/11/22 0441  AST 31  ALT 33  ALKPHOS 70     Recent Labs  Lab 05/10/22 1707 05/10/22 2300 05/11/22 0441  WBC 7.0 5.2 4.4  HGB 11.8* 10.1* 10.1*  HCT 38.1 31.5* 31.7*  PLT 240 227 210   Recent Labs  Lab 05/10/22 1707  INR 1.2         Other tests/results: CT Head - improved fluid collection CT Head (10/14) : Stable  Assessment/Plan:  [name] is doing well after L burr hole SDH evacuation  - DVT prophylaxis OK, hold Eliquis until outpatient follow up - PTOT - Drain removed  OK for discharge today with dressing removal   Deetta Perla, MD

## 2022-05-13 NOTE — TOC Initial Note (Signed)
Transition of Care [facility]) - Initial/Assessment Note    Patient Details  Name: Bianca Simmons MRN: [id number] Date of Birth: 1965-09-11  [facility] of [facility]) CM/SW Contact:    [address], [place] Phone Number: 05/13/2022, 1:30 PM  Clinical Narrative:                  CSW received consult for [facility] PT/OT. CSW attempted to reach pt, no answer, CSW spoke with pt's spouse, he is agreeable, states he has no preference. CSW reached out to [facility] with [place], he states he is able to assist and will reach out to pt to set up services.         Patient Goals and CMS Choice        Expected Discharge Plan and Services           Expected Discharge Date: 05/13/22                                    Prior Living Arrangements/Services                       Activities of Daily Living      Permission Sought/Granted                  Emotional Assessment              Admission diagnosis:  Subdural hematoma ([place]) [S06.5XAA] Dysarthria [R47.1] Patient Active Problem List   Diagnosis Date Noted   Anemia 05/10/2022   Altered mental status 05/10/2022   Slurred speech 05/10/2022   Subdural hematoma (HCC) 04/14/2022   Obesity (BMI 35.0-39.9 without comorbidity) 06/24/2017   Type 2 diabetes mellitus with hyperlipidemia ([place]) 02/04/2016   Pulmonary embolism, bilateral ([place]) 06/07/2012   Abnormal uterine bleeding 07/14/2011   Contraceptive management 04/21/2011   PCP:  Dion Body, MD Pharmacy:   [id number] [place] [place]Phone: [phone]: [phone]    Social Determinants of Health (SDOH) Interventions    Readmission Risk Interventions     No data to display

## 2022-05-13 NOTE — Discharge Summary (Addendum)
Physician Discharge Summary   Patient: Bianca Simmons MRN: [id number] DOB: September 03, 1965  Admit date:     05/10/2022  Discharge date: 05/13/22  Discharge Physician: Oswald Hillock   PCP: Dion Body, MD   Recommendations at discharge:    Follow up PCP in 2 weeks Follow up neurosurgery in 1 week Do not take Eliquis  Discharge Diagnoses: Principal Problem:   Subdural hematoma ([place]) Active Problems:   Pulmonary embolism, bilateral (HCC)   Type 2 diabetes mellitus with hyperlipidemia (HCC)   Anemia   Altered mental status   Slurred speech  Resolved Problems:   * No resolved hospital problems. *  Hospital Course: 56 year old female with a history of DVT, pulmonary embolism, hypertension, hyperlipidemia who is on anticoagulation with Eliquis presented with altered mental status and slurred speech.  Patient apparently had fallen last month and developed headache, which turned out to be subdural hematoma.  She was admitted to [facility] on 04/20/2022, underwent embolization of left middle meningeal artery and was discharged on 04/22/2022.  Postoperatively she had no change in the CT head so she was restarted on Eliquis. Now she presented with slurred speech and altered mental status. CT imaging showed left subdural hematoma with unchanged mass effect and 6 mm left-to-right midline shift.  Neurosurgery was consulted.  Assessment and Plan:  Subdural hematoma/altered mental status -CT head confirmed subdural hematoma; likely recurrence of hematoma from anticoagulation -We will continue to hold Eliquis -Neuro surgery consulted; s/p burr hole procedure -Patient mental status has imprpved; last night she had episode of confusion. Repeat Ct head was unremarkable. Continue Keppra for seizure prophylaxis Patient to follow up neurosurgery in 1 week She will need HHPT/OT  Diabetes mellitus - continue Ozempic, metformin  Hypertension - continue Lisinopril  Will hold Flomax as it can cause  dizziness         Consultants: Neurosurgery Procedures performed: Trudee Kuster hole procedure Disposition: Home Diet recommendation:  Discharge Diet Orders (From admission, onward)     Start     Ordered   05/13/22 0000  Diet - low sodium heart healthy        05/13/22 1220           Carb modified diet DISCHARGE MEDICATION: Allergies as of 05/13/2022       Reactions   Sulfa Antibiotics Nausea Only        Medication List     STOP taking these medications    Eliquis 2.5 MG Tabs tablet Generic drug: apixaban   HYDROcodone-acetaminophen 5-325 MG tablet Commonly known as: Norco   naproxen sodium 220 MG tablet Commonly known as: ALEVE   oxyCODONE 5 MG immediate release tablet Commonly known as: Oxy IR/ROXICODONE   tamsulosin 0.4 MG Caps capsule Commonly known as: FLOMAX   warfarin 10 MG tablet Commonly known as: COUMADIN   warfarin 7.5 MG tablet Commonly known as: COUMADIN       TAKE these medications    acetaminophen 500 MG tablet Commonly known as: TYLENOL Take by mouth as needed.   levETIRAcetam 500 MG tablet Commonly known as: KEPPRA Take 1 tablet (500 mg total) by mouth 2 (two) times daily.   lisinopril 5 MG tablet Commonly known as: ZESTRIL Take 5 mg by mouth daily.   lovastatin 20 MG tablet Commonly known as: MEVACOR Take 20 mg by mouth at bedtime.   metFORMIN 500 MG tablet Commonly known as: GLUCOPHAGE Take 1 tablet (500 mg total) by mouth daily with breakfast.   ondansetron 4 MG disintegrating tablet Commonly  known as: ZOFRAN-ODT Take 4 mg by mouth every 8 (eight) hours as needed.   Ozempic (1 MG/DOSE) 4 MG/3ML Sopn Generic drug: Semaglutide (1 MG/DOSE) Inject into the skin.        Follow-up Information     Meade Maw, MD Follow up in 1 week(s).   Specialty: Neurosurgery Contact information: [address] [place] [address] [phone]         Dion Body, MD Follow up in 2 week(s).    Specialty: Family Medicine Contact information: [place] [address] [phone]                Discharge Exam: Danley Danker Weights   05/10/22 1701 05/11/22 1104  Weight: 93 kg 79.6 kg   Heart- S1s2 RRR Lungs- clear bilaterally Neuro- no focal deficit  Condition at discharge: good  The results of significant diagnostics from this hospitalization (including imaging, microbiology, ancillary and laboratory) are listed below for reference.   Imaging Studies: CT HEAD WO CONTRAST (5MM)  Result Date: 05/13/2022 CLINICAL DATA:  Altered mental status EXAM: CT HEAD WITHOUT CONTRAST TECHNIQUE: Contiguous axial images were obtained from the base of the skull through the vertex without intravenous contrast. RADIATION DOSE REDUCTION: This exam was performed according to the departmental dose-optimization program which includes automated exposure control, adjustment of the mA and/or kV according to patient size and/or use of iterative reconstruction technique. COMPARISON:  05/12/2022 FINDINGS: Brain: Subdural drainage catheter has been removed. Left convexity extra-axial collection measures 8 mm in thickness, unchanged. 4 mm rightward midline shift is also on changed. No new abnormality. Vascular: No hyperdense vessel or unexpected calcification. Skull: Left frontoparietal burr hole Sinuses/Orbits: No acute finding. Other: None. IMPRESSION: 1. Unchanged size of left convexity extra-axial collection with 4 mm rightward midline shift. 2. Interval removal of subdural drainage catheter. Electronically Signed   By: Ulyses Jarred M.D.   On: 05/13/2022 01:10   CT HEAD WO CONTRAST (5MM)  Result Date: 05/12/2022 CLINICAL DATA:  56 year old female with altered mental status and left side subdural hematoma since last month. Postoperative day 1 surgical subdural drain placement. EXAM: CT HEAD WITHOUT CONTRAST TECHNIQUE: Contiguous axial images were obtained from the  base of the skull through the vertex without intravenous contrast. RADIATION DOSE REDUCTION: This exam was performed according to the departmental dose-optimization program which includes automated exposure control, adjustment of the mA and/or kV according to patient size and/or use of iterative reconstruction technique. COMPARISON:  05/10/2022 and earlier. FINDINGS: Brain: New left side subdural drain in place, courses to the left tentorium. Small volume pneumocephalus in the left subdural space now. Mixed density but predominantly low-density subdural hematoma has regressed from 11-12 mm preoperative thickness to 5-8 mm now. No hyperdense or increased blood products identified. Rightward midline shift is stable to slightly decreased, now 4 mm. Basilar cisterns now are normal. No ventriculomegaly or evidence of cortically based acute infarction. Stable gray-white matter differentiation. Vascular: Calcified atherosclerosis at the skull base. No suspicious intracranial vascular hyperdensity. Skull: New left superior frontal convexity burr hole. Underlying hyperostosis of the calvarium. No other No acute osseous abnormality identified. Sinuses/Orbits: Visualized paranasal sinuses and mastoids are clear. Other: Postoperative changes to the left scalp now. Skin staples in place. Mild scalp soft tissue gas. Visualized orbit soft tissues are within normal limits. IMPRESSION: 1. New left subdural drain in place with decreased low-density left subdural hematoma, residual up to 8 mm. 2. Rightward midline shift is stable to slightly  decreased at 4 mm. 3. No new intracranial abnormality. Electronically Signed   By: Genevie Ann M.D.   On: 05/12/2022 06:42   CT Head Wo Contrast  Result Date: 05/10/2022 CLINICAL DATA:  Altered mental status EXAM: CT HEAD WITHOUT CONTRAST TECHNIQUE: Contiguous axial images were obtained from the base of the skull through the vertex without intravenous contrast. RADIATION DOSE REDUCTION: This exam  was performed according to the departmental dose-optimization program which includes automated exposure control, adjustment of the mA and/or kV according to patient size and/or use of iterative reconstruction technique. COMPARISON:  04/13/2022 FINDINGS: Brain: Redemonstrated left subdural collection, now predominantly hypodense and chronic, which measures up to 12 mm, previously 12 mm. No hyperdense blood products to suggest acute hemorrhage. Overall unchanged mass effect with 6 mm of left-to-right midline shift. Narrowing of the left lateral ventricle, unchanged. No acute infarct, hemorrhage, mass, or hydrocephalus. Vascular: No hyperdense vessel. Skull: Negative for fracture or focal lesion. Sinuses/Orbits: Clear paranasal sinuses. The orbits are unremarkable. Other: The mastoids are well aerated. IMPRESSION: Redemonstrated left subdural collection, now predominantly hypodense and chronic, with unchanged mass effect and 6 mm of left-to-right midline shift. No hyperdense blood products to suggest acute hemorrhage. Electronically Signed   By: Merilyn Baba M.D.   On: 05/10/2022 17:29    Microbiology: Results for orders placed or performed during the hospital encounter of 05/10/22  SARS Coronavirus 2 by RT PCR (hospital order, performed in [facility] hospital lab) *cepheid single result test* Anterior Nasal Swab     Status: None   Collection Time: 05/11/22  8:34 AM   Specimen: Anterior Nasal Swab  Result Value Ref Range Status   SARS Coronavirus 2 by RT PCR NEGATIVE NEGATIVE Final    Comment: (NOTE) SARS-CoV-2 target nucleic acids are NOT DETECTED.  The SARS-CoV-2 RNA is generally detectable in upper and lower respiratory specimens during the acute phase of infection. The lowest concentration of SARS-CoV-2 viral copies this assay can detect is 250 copies / mL. A negative result does not preclude SARS-CoV-2 infection and should not be used as the sole basis for treatment or other patient management  decisions.  A negative result may occur with improper specimen collection / handling, submission of specimen other than nasopharyngeal swab, presence of viral mutation(s) within the areas targeted by this assay, and inadequate number of viral copies (<250 copies / mL). A negative result must be combined with clinical observations, patient history, and epidemiological information.  Fact Sheet for Patients:   https://www.patel.info/  Fact Sheet for Healthcare Providers: https://hall.com/  This test is not yet approved or  cleared by the [place] FDA and has been authorized for detection and/or diagnosis of SARS-CoV-2 by FDA under an Emergency Use Authorization (EUA).  This EUA will remain in effect (meaning this test can be used) for the duration of the COVID-19 declaration under Section 564(b)(1) of the Act, 21 U.S.C. section 360bbb-3(b)(1), unless the authorization is terminated or revoked sooner.  Performed at [facility], [address] [place], [place] [address]   MRSA Next Gen by PCR, Nasal     Status: None   Collection Time: 05/11/22 11:09 AM   Specimen: Nasal Mucosa; Nasal Swab  Result Value Ref Range Status   MRSA by PCR Next Gen NOT DETECTED NOT DETECTED Final    Comment: (NOTE) The GeneXpert MRSA Assay (FDA approved for NASAL specimens only), is one component of a comprehensive MRSA colonization surveillance program. It is not intended to diagnose MRSA infection  nor to guide or monitor treatment for MRSA infections. Test performance is not FDA approved in patients less than 6 years old. Performed at [facility], [address] [place], [place] [address]     Labs: CBC: Recent Labs  Lab 05/10/22 1707 05/10/22 2300 05/11/22 0441  WBC 7.0 5.2 4.4  NEUTROABS 3.9 2.4 1.9  HGB 11.8* 10.1* 10.1*  HCT 38.1 31.5* 31.7*  MCV 90.7 88.0 89.5  PLT 240 227 353   Basic Metabolic Panel: Recent Labs   Lab 05/10/22 1707 05/10/22 2300 05/11/22 0441  NA 141 141 142  K 3.7 3.5 3.7  CL 107 108 109  CO2 '26 26 27  '$ GLUCOSE 93 95 97  BUN '13 10 9  '$ CREATININE 0.60 0.57 0.61  CALCIUM 9.0 8.7* 8.7*  MG  --  1.7 1.8  PHOS  --  4.5 5.4*   Liver Function Tests: Recent Labs  Lab 05/10/22 2300 05/11/22 0441  AST 29 31  ALT 31 33  ALKPHOS 71 70  BILITOT 0.8 0.7  PROT 6.2* 6.1*  ALBUMIN 3.0* 3.0*   CBG: Recent Labs  Lab 05/12/22 2008 05/12/22 2324 05/13/22 0335 05/13/22 0755 05/13/22 1132  GLUCAP 157* 143* 132* 127* 122*    Discharge time spent: greater than 30 minutes.  Signed: Oswald Hillock, MD [facility] 05/13/2022

## 2022-05-13 NOTE — Progress Notes (Signed)
       CROSS COVER NOTE  NAME: Chantay Whitelock MRN: [id number] DOB : July 28, 1966    Date of Service   05/13/2022  HPI/Events of Note   Patient with SDH s/p Trudee Kuster hole   awakened from sleep very confused and unable to state her name   Assessment and  Interventions   Stat head ct Unchanged 12m right midline shift Neurosurgery made aware of event by nursing staff

## 2022-05-13 NOTE — Progress Notes (Signed)
At approx 0030 this RN woke pt from sleep to conduct Q4 hour neuro exam. Pt appeared startled when awakened and at first refused to answer questions. Pt finally responded when asked to give her name with "I don't know". Pt was disoriented x4. Stroke assessment performed with no additional signs other than memory loss noted. Sharion Settler contacted and stat CT head order obtained. Following completion of CT, neurosurgery on call, Dr. Dava Najjar, notified. By this time, pt able to recall her name, birthday, month, year, and location. As CT unchanged with respect to prior and symptoms resolving no further testing to be conducted per Dr. Dava Najjar.  Pt's husband notified and presence requested per pt. Husband en route. Will continue to monitor.

## 2022-05-15 NOTE — Telephone Encounter (Signed)
Patient has been discharged and the CT has been ordered. I will obtain authorization for this.

## 2022-05-16 ENCOUNTER — Encounter: Payer: Self-pay | Admitting: Neurosurgery

## 2022-05-19 NOTE — Telephone Encounter (Signed)
CT 06/06/2022.

## 2022-05-25 NOTE — Progress Notes (Signed)
   REFERRING PHYSICIAN:  Dion Simmons, Bianca Simmons,  Bianca Simmons [id number]  DOS: 05/11/22 Left frontal burr hole for drainage of subdural hematoma  HISTORY OF PRESENT ILLNESS: Bianca Simmons is 2 weeks status post Left frontal burr hole for drainage of subdural hematoma.   She has been holding her eliquis. She is on keppra. Thinks she was told to hold her meformin and ozempic.   She is doing HHPT now. Needs referral for out patient speech therapy.   Tripped over dog 2 days ago and fell, she did not hit her head.   She is not having any pain. She is still having headaches- no change since surgery. She has intermittent numbness in her hands and  inside her mouth. No blurry vision or vision changes. She feels like speech is slowly improving. She has dizziness that is worse with bending over- this is unchanged since prior to surgery.     PHYSICAL EXAMINATION:  NEUROLOGICAL:  General: In no acute distress.   Awake, alert, oriented to person, place, and time.  Pupils equal round and reactive to light.  Facial tone is symmetric.  Tongue protrusion is midline.    Strength: Side Biceps Triceps Deltoid Interossei Grip Wrist Ext. Wrist Flex.  R '5 5 5 5 5 5 5  '$ L '5 5 5 5 5 5 5   '$ Incision is C/D/I  Imaging:  Nothing new to review.   Assessment / Plan: Bianca Simmons is doing well s/p above surgery. Treatment options reviewed with patient and following plan made:   - We discussed activity escalation and I have advised the patient to lift up to 10 pounds until 6 weeks after surgery (until your follow up with Dr. Izora Simmons).  No strenuous activity.  - Reviewed wound care.  - Outpatient referral done for speech therapy.  - Continue current medications including keppra.  - Continue to hold her eliquis . Maybe able to restart after her next visit.   - Follow up with PCP regarding metformin and ozempic. Per d/c summary, it looks like she should be taking  these.  - Follow up as scheduled in 2 weeks with repeat CT scan.   Above plan reviewed with Dr. Izora Simmons and he agrees.   Advised to contact the office if any questions or concerns arise.   Bianca Boot PA-C Dept of Neurosurgery

## 2022-05-26 ENCOUNTER — Other Ambulatory Visit: Payer: Self-pay

## 2022-05-26 ENCOUNTER — Other Ambulatory Visit: Payer: Self-pay | Admitting: Hematology

## 2022-05-26 ENCOUNTER — Ambulatory Visit (INDEPENDENT_AMBULATORY_CARE_PROVIDER_SITE_OTHER): Payer: BC Managed Care – PPO | Admitting: Orthopedic Surgery

## 2022-05-26 ENCOUNTER — Encounter: Payer: Self-pay | Admitting: Orthopedic Surgery

## 2022-05-26 VITALS — BP 134/67 | HR 73 | Temp 98.3°F | Ht 68.0 in | Wt 181.2 lb

## 2022-05-26 DIAGNOSIS — S065X0D Traumatic subdural hemorrhage without loss of consciousness, subsequent encounter: Secondary | ICD-10-CM

## 2022-05-26 DIAGNOSIS — R791 Abnormal coagulation profile: Secondary | ICD-10-CM

## 2022-05-26 DIAGNOSIS — Z09 Encounter for follow-up examination after completed treatment for conditions other than malignant neoplasm: Secondary | ICD-10-CM

## 2022-05-26 DIAGNOSIS — I82409 Acute embolism and thrombosis of unspecified deep veins of unspecified lower extremity: Secondary | ICD-10-CM

## 2022-05-26 DIAGNOSIS — Z9889 Other specified postprocedural states: Secondary | ICD-10-CM

## 2022-05-26 DIAGNOSIS — S065XAA Traumatic subdural hemorrhage with loss of consciousness status unknown, initial encounter: Secondary | ICD-10-CM

## 2022-05-30 ENCOUNTER — Telehealth: Payer: Self-pay

## 2022-05-30 NOTE — Telephone Encounter (Signed)
Verbal order was given to Mel. 

## 2022-05-30 NOTE — Telephone Encounter (Signed)
-----   Message from Peggyann Shoals sent at 05/30/2022  3:47 PM EDT ----- Regarding: verbal order Contact: [phone] Mel with [facility] burr holes on 05/11/22 Verbal order for a speech therapist to work with her to improve her speech

## 2022-06-06 ENCOUNTER — Ambulatory Visit
Admission: RE | Admit: 2022-06-06 | Discharge: 2022-06-06 | Disposition: A | Discharge status: Home / Self Care | Payer: BC Managed Care – PPO | Source: Ambulatory Visit | Attending: Neurosurgery | Admitting: Neurosurgery

## 2022-06-06 DIAGNOSIS — S065XAA Traumatic subdural hemorrhage with loss of consciousness status unknown, initial encounter: Secondary | ICD-10-CM | POA: Diagnosis present

## 2022-06-08 ENCOUNTER — Encounter: Payer: BC Managed Care – PPO | Admitting: Neurosurgery

## 2022-06-09 ENCOUNTER — Ambulatory Visit (INDEPENDENT_AMBULATORY_CARE_PROVIDER_SITE_OTHER): Payer: BC Managed Care – PPO | Admitting: Neurosurgery

## 2022-06-09 ENCOUNTER — Encounter: Payer: Self-pay | Admitting: Neurosurgery

## 2022-06-09 VITALS — BP 118/76 | Ht 68.0 in | Wt 181.0 lb

## 2022-06-09 DIAGNOSIS — S065XAD Traumatic subdural hemorrhage with loss of consciousness status unknown, subsequent encounter: Secondary | ICD-10-CM

## 2022-06-09 DIAGNOSIS — S065XAA Traumatic subdural hemorrhage with loss of consciousness status unknown, initial encounter: Secondary | ICD-10-CM

## 2022-06-09 DIAGNOSIS — Z09 Encounter for follow-up examination after completed treatment for conditions other than malignant neoplasm: Secondary | ICD-10-CM

## 2022-06-09 DIAGNOSIS — R479 Unspecified speech disturbances: Secondary | ICD-10-CM

## 2022-06-09 NOTE — Progress Notes (Addendum)
   REFERRING PHYSICIAN:  Dion Body, [address],  [place] [address]  DOS: 05/11/22 Left frontal burr hole for drainage of subdural hematoma  HISTORY OF PRESENT ILLNESS: Bianca Simmons is status post Left frontal burr hole for drainage of subdural hematoma.  She has had slow improvement with her speech.  She continues to have some right arm numbness.  She has difficulty with output of her speech, but she does understand well.  She continues to have some headaches. PHYSICAL EXAMINATION:  NEUROLOGICAL:  General: In no acute distress.   Awake, alert, oriented to person, place, and time.  Pupils equal round and reactive to light.  Facial tone is symmetric.  Tongue protrusion is midline.  Speech is slow but she is able to get out her thoughts.  She makes some paraphasic errors.  Strength: Side Biceps Triceps Deltoid Interossei Grip Wrist Ext. Wrist Flex.  R '5 5 5 5 5 5 5  '$ L '5 5 5 5 5 5 5   '$ Incision is C/D/I  Imaging:  CT scan reviewed-minimal residual left-sided subdural hematoma  Assessment / Plan: Bianca Simmons is doing well s/p above surgery.  She still has residual symptoms.  This is particularly apparent in her speech.  I will touch base with her hematologist to determine a plan for restarting her anticoagulation.  She is not ready to return to work.  She will continue Keppra for now.  We will readdress weaning off at her next visit.  She will continue physical therapy.  I have referred her for speech therapy.  I will see her back in 2 months.    Meade Maw MD Dept of Neurosurgery

## 2022-06-12 ENCOUNTER — Other Ambulatory Visit: Payer: Self-pay

## 2022-06-12 ENCOUNTER — Inpatient Hospital Stay: Payer: BC Managed Care – PPO | Attending: Hematology

## 2022-06-12 ENCOUNTER — Encounter: Payer: Self-pay | Admitting: Hematology

## 2022-06-12 ENCOUNTER — Inpatient Hospital Stay (HOSPITAL_BASED_OUTPATIENT_CLINIC_OR_DEPARTMENT_OTHER): Payer: BC Managed Care – PPO | Admitting: Hematology

## 2022-06-12 VITALS — BP 114/51 | HR 72 | Temp 98.1°F | Resp 16 | Ht 68.0 in | Wt 184.0 lb

## 2022-06-12 DIAGNOSIS — Z86711 Personal history of pulmonary embolism: Secondary | ICD-10-CM | POA: Insufficient documentation

## 2022-06-12 DIAGNOSIS — W108XXA Fall (on) (from) other stairs and steps, initial encounter: Secondary | ICD-10-CM | POA: Diagnosis not present

## 2022-06-12 DIAGNOSIS — I82409 Acute embolism and thrombosis of unspecified deep veins of unspecified lower extremity: Secondary | ICD-10-CM

## 2022-06-12 DIAGNOSIS — S065XAA Traumatic subdural hemorrhage with loss of consciousness status unknown, initial encounter: Secondary | ICD-10-CM | POA: Insufficient documentation

## 2022-06-12 DIAGNOSIS — I2782 Chronic pulmonary embolism: Secondary | ICD-10-CM

## 2022-06-12 DIAGNOSIS — Z86718 Personal history of other venous thrombosis and embolism: Secondary | ICD-10-CM | POA: Insufficient documentation

## 2022-06-12 LAB — CMP (CANCER CENTER ONLY)
ALT: 16 U/L (ref 0–44)
AST: 14 U/L — ABNORMAL LOW (ref 15–41)
Albumin: 4.1 g/dL (ref 3.5–5.0)
Alkaline Phosphatase: 98 U/L (ref 38–126)
Anion gap: 4 — ABNORMAL LOW (ref 5–15)
BUN: 18 mg/dL (ref 6–20)
CO2: 28 mmol/L (ref 22–32)
Calcium: 9.1 mg/dL (ref 8.9–10.3)
Chloride: 106 mmol/L (ref 98–111)
Creatinine: 0.73 mg/dL (ref 0.44–1.00)
GFR, Estimated: >60 mL/min (ref >60)
Glucose, Bld: 82 mg/dL (ref 70–99)
Potassium: 4.2 mmol/L (ref 3.5–5.1)
Sodium: 138 mmol/L (ref 135–145)
Total Bilirubin: 0.3 mg/dL (ref 0.3–1.2)
Total Protein: 7.6 g/dL (ref 6.5–8.1)

## 2022-06-12 LAB — CBC WITH DIFFERENTIAL (CANCER CENTER ONLY)
Abs Immature Granulocytes: 0.01 10*3/uL (ref 0.00–0.07)
Basophils Absolute: 0 10*3/uL (ref 0.0–0.1)
Basophils Relative: 1 %
Eosinophils Absolute: 0.1 10*3/uL (ref 0.0–0.5)
Eosinophils Relative: 1 %
HCT: 38.8 % (ref 36.0–46.0)
Hemoglobin: 12.6 g/dL (ref 12.0–15.0)
Immature Granulocytes: 0 %
Lymphocytes Relative: 27 %
Lymphs Abs: 1.5 10*3/uL (ref 0.7–4.0)
MCH: 29.4 pg (ref 26.0–34.0)
MCHC: 32.5 g/dL (ref 30.0–36.0)
MCV: 90.4 fL (ref 80.0–100.0)
Monocytes Absolute: 0.4 10*3/uL (ref 0.1–1.0)
Monocytes Relative: 7 %
Neutro Abs: 3.6 10*3/uL (ref 1.7–7.7)
Neutrophils Relative %: 64 %
Platelet Count: 216 10*3/uL (ref 150–400)
RBC: 4.29 MIL/uL (ref 3.87–5.11)
RDW: 14.8 % (ref 11.5–15.5)
WBC Count: 5.6 10*3/uL (ref 4.0–10.5)
nRBC: 0 % (ref 0.0–0.2)

## 2022-06-12 LAB — PROTIME-INR
INR: 1.1 (ref 0.8–1.2)
Prothrombin Time: 13.9 seconds (ref 11.4–15.2)

## 2022-06-12 NOTE — Progress Notes (Signed)
[place]   [phone] [phone]   Clinic Follow up Note   Patient Care Team: Dion Body, MD as PCP - General (Family Medicine)  Date of Service:  06/12/2022  CHIEF COMPLAINT: h/o of recurrent PE and DVT  CURRENT THERAPY:  Observation  ASSESSMENT & PLAN:  Bianca Simmons is a 56 y.o. female with   1. Recurrent PE and DVT -h/o PE in association with birth control pills in 2001 -started Xarelto 05/2012 for unprovoked bilateral PE and asymptomatic RLE DVT. Negative hypercoagulation workup -developed recurrent unprovoked bilateral PE when she was on Xarelto 20 mg daily in 04/2016 and was switched to Coumadin. -recommend lifelong prophylactic anticoagulation. -She had been on Coumadin '10mg'$  on Sundays and 7.'5mg'$  Monday-Saturday.  -her coumadin has been held since discovery of hematoma (see #2). I discussed the pros and cons of restarting coumadin with her today-- if she does restart, she is at risk for bleeding again, especially she is at [facility] for fall now, but if she stays off it, she is at risk for recurrent PE. I discussed the option of IVC filter to help prevent blood clots traveling from legs to lungs. I explained the procedure to her and how this can help. This can also be removed at a later time if needed. After discussion, she agreed to proceed. I will refer her to IR for this procedure. -she continues therapy for her hematoma. She reports she has much improved and continues working on it. Labs reviewed, CBC, CMP, and INR are all WNL.    2. Subdural hematoma after fall  -formed following head trauma from fall, initially declined evaluation. -she developed headaches (worse with bending), nausea, right hand weakness, and slurred speech. -ultimately underwent burr holes procedure 05/11/22.     Plan -due to her risk of fall and bleeding, I will continue holding her anticoagulation -refer her to IR for IVC filter placement  -F/u in 4  months   No problem-specific Assessment & Plan notes found for this encounter.   INTERVAL HISTORY:  Bianca Simmons is here for a follow up of recurrent PE and DVT. She was last seen by me one year ago. She presents to the clinic alone. She recently had subdural hematoma. She explains it began after a fall she had at work, in which she was on the stairs and hit the wall (which is brick). She notes she didn't initially go to the ED following the fall and returned to work. She reports she developed headaches, worse with bending over, with nausea and some right hand numbness. She made an appointment to see her PCP in 03/2022 and was referred to the ED. She later underwent burr holes procedure on 05/11/22.   All other systems were reviewed with the patient and are negative.  MEDICAL HISTORY:  Past Medical History:  Diagnosis Date   Diabetes mellitus without complication ([place])    DVT (deep venous thrombosis) ([place]) 06/08/2012   Hyperlipidemia    Hypertension    Personal history of venous thrombosis and embolism 04/19/2016   Pulmonary embolism 10 yrs ago 06/07/2012   Pulmonary embolism, blood-clot, obstetric 2002   on birth control-pulm blood clot-treated with coumadin-no problems now   SOB (shortness of breath) 06/07/2012    SURGICAL HISTORY: Past Surgical History:  Procedure Laterality Date   BURR HOLE Left 05/11/2022   Procedure: BURR HOLES;  Surgeon: Meade Maw, MD;  Location: [facility] ORS;  Service: Neurosurgery;  Laterality: Left;   DILATATION & CURETTAGE/HYSTEROSCOPY WITH  MYOSURE N/A 01/10/2019   Procedure: DILATATION & CURETTAGE/HYSTEROSCOPY WITH MYOSURE;  Surgeon: Benjaman Kindler, MD;  Location: [facility] ORS;  Service: Gynecology;  Laterality: N/A;   HYSTEROSCOPY WITH D & C N/A 01/10/2019   Procedure: DILATATION AND CURETTAGE /HYSTEROSCOPY;  Surgeon: Benjaman Kindler, MD;  Location: [facility]S;  Service: Gynecology;  Laterality: N/A;   IUD REMOVAL  OCT 2012   LAPAROSCOPIC TUBAL LIGATION   07/14/2011   Procedure: LAPAROSCOPIC TUBAL LIGATION;  Surgeon: Agnes Lawrence, MD;  Location: [place] [facility];  Service: Gynecology;  Laterality: N/A;   NO PAST SURGERIES     TUBAL LIGATION  04/21/2011   Procedure: ESSURE TUBAL STERILIZATION;  Surgeon: Agnes Lawrence, MD;  Location: [place] [facility];  Service: Gynecology;  Laterality: Bilateral;   attempted essure,removal of IUD, hysteroscopy,         dilatation and currettage   vaginal ablasion      I have reviewed the social history and family history with the patient and they are unchanged from previous note.  ALLERGIES:  is allergic to sulfa antibiotics.  MEDICATIONS:  Current Outpatient Medications  Medication Sig Dispense Refill   acetaminophen (TYLENOL) 500 MG tablet Take by mouth as needed.     levETIRAcetam (KEPPRA) 500 MG tablet Take 1 tablet (500 mg total) by mouth 2 (two) times daily. 60 tablet 3   lisinopril (ZESTRIL) 5 MG tablet Take 5 mg by mouth daily.     lovastatin (MEVACOR) 20 MG tablet Take 20 mg by mouth at bedtime.     No current facility-administered medications for this visit.    PHYSICAL EXAMINATION: ECOG PERFORMANCE STATUS: 2 - Symptomatic, <50% confined to bed  Vitals:   06/12/22 1311  BP: (!) 114/51  Pulse: 72  Resp: 16  Temp: 98.1 F (36.7 C)   Wt Readings from Last 3 Encounters:  06/12/22 184 lb (83.5 kg)  06/09/22 181 lb (82.1 kg)  05/26/22 181 lb 3.2 oz (82.2 kg)     GENERAL:alert, no distress and comfortable SKIN: skin color normal, no rashes or significant lesions EYES: normal, Conjunctiva are pink and non-injected, sclera clear  NEURO: alert & oriented x 3 with fluent speech  LABORATORY DATA:  I have reviewed the data as listed    Latest Ref Rng & Units 06/12/2022   12:37 PM 05/11/2022    4:41 AM 05/10/2022   11:00 PM  CBC  WBC 4.0 - 10.5 K/uL 5.6  4.4  5.2   Hemoglobin 12.0 - 15.0 g/dL 12.6  10.1  10.1   Hematocrit 36.0 - 46.0 % 38.8  31.7  31.5   Platelets 150 - 400 K/uL 216  210   227         Latest Ref Rng & Units 06/12/2022   12:37 PM 05/11/2022    4:41 AM 05/10/2022   11:00 PM  CMP  Glucose 70 - 99 mg/dL 82  97  95   BUN 6 - 20 mg/dL '18  9  10   '$ Creatinine 0.44 - 1.00 mg/dL 0.73  0.61  0.57   Sodium 135 - 145 mmol/L 138  142  141   Potassium 3.5 - 5.1 mmol/L 4.2  3.7  3.5   Chloride 98 - 111 mmol/L 106  109  108   CO2 22 - 32 mmol/L '28  27  26   '$ Calcium 8.9 - 10.3 mg/dL 9.1  8.7  8.7   Total Protein 6.5 - 8.1 g/dL 7.6  6.1  6.2   Total Bilirubin  0.3 - 1.2 mg/dL 0.3  0.7  0.8   Alkaline Phos 38 - 126 U/L 98  70  71   AST 15 - 41 U/L '14  31  29   '$ ALT 0 - 44 U/L 16  33  31       RADIOGRAPHIC STUDIES: I have personally reviewed the radiological images as listed and agreed with the findings in the report. No results found.    Orders Placed This Encounter  Procedures   IR IVC FILTER PLMT / S&I /IMG GUID/MOD SED    Standing Status:   Future    Standing Expiration Date:   06/13/2023    Order Specific Question:   Reason for exam:    Answer:   hidtory of multipel DVT/PE, recent dubdura bleeding, not able to restart anticoagulation    Order Specific Question:   Is the patient pregnant?    Answer:   No    Order Specific Question:   Preferred Imaging Location?    Answer:   [facility]   All questions were answered. The patient knows to call the clinic with any problems, questions or concerns. No barriers to learning was detected. The total time spent in the appointment was 30 minutes.     Truitt Merle, MD 06/12/2022   I, Wilburn Mylar, am acting as scribe for Truitt Merle, MD.   I have reviewed the above documentation for accuracy and completeness, and I agree with the above.

## 2022-06-16 ENCOUNTER — Other Ambulatory Visit: Payer: Self-pay

## 2022-06-16 NOTE — Progress Notes (Signed)
Spoke with [facility] IR [place] [phone]) regarding appt for IVC Filter placement.  Marcie Bal will call pt to schedule appt.

## 2022-06-28 ENCOUNTER — Ambulatory Visit: Payer: BC Managed Care – PPO | Admitting: Radiology

## 2022-06-29 ENCOUNTER — Telehealth: Payer: Self-pay

## 2022-06-29 DIAGNOSIS — S065XAA Traumatic subdural hemorrhage with loss of consciousness status unknown, initial encounter: Secondary | ICD-10-CM

## 2022-06-29 NOTE — Telephone Encounter (Signed)
-----   Message from Peggyann Shoals sent at 06/29/2022  2:05 PM EST ----- Regarding: outpatient PT burr holes on 05/11/22 Alvis Lemmings has discharged her from [facility] PT. She feels she needs more physical therapy. They recommended outpatient PT. Can we get a referral and fax it to [facility] in [place] please.

## 2022-06-30 NOTE — Telephone Encounter (Signed)
Referral faxed to [facility], patient aware.

## 2022-06-30 NOTE — Telephone Encounter (Signed)
Referral placed.

## 2022-07-03 ENCOUNTER — Other Ambulatory Visit: Payer: Self-pay | Admitting: Internal Medicine

## 2022-07-03 DIAGNOSIS — I82409 Acute embolism and thrombosis of unspecified deep veins of unspecified lower extremity: Secondary | ICD-10-CM

## 2022-07-03 NOTE — H&P (Incomplete)
Chief Complaint: History of DVT/ PE. Recent subdural bleed.Team is requesting IVC filter placement  Referring Physician(s): Feng,Yan  Supervising Physician: Juliet Rude  Patient Status: [facility] - Out-pt  History of Present Illness: Bianca Simmons is a 56 y.o. female female outpatient. History of DVT, PE. Recent subdural bleed s/p burr hole.  Unable to tolerate anticoagulation. Team is requesting IVC filter placement.  Currently without any significant complaints. Patient alert and laying in bed,calm. Denies any fevers, headache, chest pain, SOB, cough, abdominal pain, nausea, vomiting or bleeding. Return precautions and treatment recommendations and follow-up discussed with the patient *** who is agreeable with the plan.    Past Medical History:  Diagnosis Date   Diabetes mellitus without complication ([place])    DVT (deep venous thrombosis) ([place]) 06/08/2012   Hyperlipidemia    Hypertension    Personal history of venous thrombosis and embolism 04/19/2016   Pulmonary embolism 10 yrs ago 06/07/2012   Pulmonary embolism, blood-clot, obstetric 2002   on birth control-pulm blood clot-treated with coumadin-no problems now   SOB (shortness of breath) 06/07/2012    Past Surgical History:  Procedure Laterality Date   BURR HOLE Left 05/11/2022   Procedure: BURR HOLES;  Surgeon: Meade Maw, MD;  Location: [facility];  Service: Neurosurgery;  Laterality: Left;   DILATATION & CURETTAGE/HYSTEROSCOPY WITH MYOSURE N/A 01/10/2019   Procedure: DILATATION & CURETTAGE/HYSTEROSCOPY WITH MYOSURE;  Surgeon: Benjaman Kindler, MD;  Location: [facility];  Service: Gynecology;  Laterality: N/A;   HYSTEROSCOPY WITH D & C N/A 01/10/2019   Procedure: DILATATION AND CURETTAGE /HYSTEROSCOPY;  Surgeon: Benjaman Kindler, MD;  Location: [facility];  Service: Gynecology;  Laterality: N/A;   IUD REMOVAL  OCT 2012   LAPAROSCOPIC TUBAL LIGATION  07/14/2011   Procedure: LAPAROSCOPIC TUBAL LIGATION;  Surgeon: Agnes Lawrence, MD;  Location: [place] [facility];  Service: Gynecology;  Laterality: N/A;   NO PAST SURGERIES     TUBAL LIGATION  04/21/2011   Procedure: ESSURE TUBAL STERILIZATION;  Surgeon: Agnes Lawrence, MD;  Location: [place] [facility];  Service: Gynecology;  Laterality: Bilateral;   attempted essure,removal of IUD, hysteroscopy,         dilatation and currettage   vaginal ablasion      Allergies: Sulfa antibiotics  Medications: Prior to Admission medications   Medication Sig Start Date End Date Taking? Authorizing Provider  acetaminophen (TYLENOL) 500 MG tablet Take by mouth as needed.    [provider]  levETIRAcetam (KEPPRA) 500 MG tablet Take 1 tablet (500 mg total) by mouth 2 (two) times daily. 05/13/22   Oswald Hillock, MD  lisinopril (ZESTRIL) 5 MG tablet Take 5 mg by mouth daily. 10/15/18   [provider]  lovastatin (MEVACOR) 20 MG tablet Take 20 mg by mouth at bedtime. 09/27/18   [provider]  Rivaroxaban (XARELTO) 15 MG TABS tablet Take 1 tablet (15 mg total) by mouth 2 (two) times daily with a meal. 05/17/16 05/19/16  Merlyn Lot, MD     Family History  Problem Relation Age of Onset   Diabetes Mother    Diabetes Father    Diabetes Sister     Social History   Socioeconomic History   Marital status: Married    Spouse name: Not on file   Number of children: Not on file   Years of education: Not on file   Highest education level: Not on file  Occupational History   Not on file  Tobacco Use   Smoking status: Never  Smokeless tobacco: Never  Vaping Use   Vaping Use: Never used  Substance and Sexual Activity   Alcohol use: No   Drug use: No   Sexual activity: Not on file  Other Topics Concern   Not on file  Social History Narrative   Not on file   Social Determinants of Health   Financial Resource Strain: Not on file  Food Insecurity: Not on file  Transportation Needs: Not on file  Physical Activity: Not on file  Stress: Not on file   Social Connections: Not on file    ECOG Status: 0 - Asymptomatic  Review of Systems: A 12 point ROS discussed and pertinent positives are indicated in the HPI above.  All other systems are negative.  Review of Systems  Vital Signs: There were no vitals taken for this visit.    Physical Exam  Imaging: CT HEAD WO CONTRAST (5MM)  Result Date: 06/06/2022 CLINICAL DATA:  Follow-up subdural hematoma EXAM: CT HEAD WITHOUT CONTRAST TECHNIQUE: Contiguous axial images were obtained from the base of the skull through the vertex without intravenous contrast. RADIATION DOSE REDUCTION: This exam was performed according to the departmental dose-optimization program which includes automated exposure control, adjustment of the mA and/or kV according to patient size and/or use of iterative reconstruction technique. COMPARISON:  Brain CT 05/13/2022, 05/12/2022, 05/10/2022, 04/13/2022 FINDINGS: Brain: No acute territorial infarction or intracranial mass is visualized. Small residual left convexity subdural hematoma measures 4 mm maximum thickness, axial series 3, image 16, mostly low-density but areas of I so density within. No hyperdense acute appearing blood in the interval. Previously the hematoma measured 8 mm. Resolution of small intracranial air. Decreased mediastinal shift to the right with minimal residual 1 mm shift to the right now evident. Focal hypodensity within the subcortical white matter of the left posterior frontal lobe, series 3, image 21 and 22, probably present on prior but slightly more conspicuous today now that subdural hematoma and midline shift have largely resolved. Vascular: No hyperdense vessels. Mild carotid vascular calcification Skull: No fracture.  Left parietal burr hole. Sinuses/Orbits: No acute finding. Other: None IMPRESSION: 1. Interval decrease in size of left convexity subdural hematoma, now measuring 4 mm maximum thickness, previously 8 mm. There is minimal 1 mm midline  shift to the right, decreased from 4 mm previously. Resolution of postoperative intracranial air. 2. Focal hypodensity within the subcortical white matter of the left posterior frontal lobe, probably present on prior but slightly more conspicuous today, may represent resolving area of edema or potential remote infarct. Electronically Signed   By: Donavan Foil M.D.   On: 06/06/2022 16:12    Labs:  CBC: Recent Labs    05/10/22 1707 05/10/22 2300 05/11/22 0441 06/12/22 1237  WBC 7.0 5.2 4.4 5.6  HGB 11.8* 10.1* 10.1* 12.6  HCT 38.1 31.5* 31.7* 38.8  PLT 240 227 210 216    COAGS: Recent Labs    12/18/21 2123 01/30/22 0831 05/10/22 1707 06/12/22 1237  INR 1.9* 2.7* 1.2 1.1    BMP: Recent Labs    05/10/22 1707 05/10/22 2300 05/11/22 0441 06/12/22 1237  NA 141 141 142 138  K 3.7 3.5 3.7 4.2  CL 107 108 109 106  CO2 '26 26 27 28  '$ GLUCOSE 93 95 97 82  BUN '13 10 9 18  '$ CALCIUM 9.0 8.7* 8.7* 9.1  CREATININE 0.60 0.57 0.61 0.73  GFRNONAA >60 >60 >60 >60    LIVER FUNCTION TESTS: Recent Labs    01/30/22 0831  05/10/22 2300 05/11/22 0441 06/12/22 1237  BILITOT 0.4 0.8 0.7 0.3  AST 13* 29 31 14*  ALT 12 31 33 16  ALKPHOS 92 71 70 98  PROT 7.5 6.2* 6.1* 7.6  ALBUMIN 3.9 3.0* 3.0* 4.1     Assessment and Plan:  56 y.o. female inpatient. History of DVT, PE. Recent subdural bleed s/p burr hole.  Unable to tolerate anticoagulation. Team is requesting IVC filter placement.   Labs from 11.13.23 unremarkable. All medications are within acceptable parameters. Allergies include sulfa. Patient has been NPO since midnight.   Risks and benefits discussed with the patient including, but not limited to bleeding, infection, contrast induced renal failure, filter fracture or migration which can lead to emergency surgery or even death, strut penetration with damage or irritation to adjacent structures and caval thrombosis. All of the patient's questions were answered, patient is  agreeable to proceed. Consent signed and in chart.   Thank you for this interesting consult.  I greatly enjoyed meeting Bianca Simmons and look forward to participating in their care.  A copy of this report was sent to the requesting provider on this date.  Electronically Signed: Jacqualine Mau, NP 07/03/2022, 1:51 PM   I spent a total of  30 Minutes   in face to face in clinical consultation, greater than 50% of which was counseling/coordinating care for IVC filter placement

## 2022-07-03 NOTE — Progress Notes (Signed)
Patient for IVC Filter Placement on Tues 07/04/2022, I called and spoke with the  patient on the phone and gave pre-procedure instructions.  Pt was made aware to be here at [place] at the new entrance, NPO after MN prior to procedure as well as driver post procedure/recovery/discharge. Pt stated understanding.  Called 07/03/2022

## 2022-07-04 ENCOUNTER — Ambulatory Visit
Admission: RE | Admit: 2022-07-04 | Discharge: 2022-07-04 | Disposition: A | Discharge status: Home / Self Care | Payer: No Typology Code available for payment source | Source: Ambulatory Visit | Attending: Hematology | Admitting: Hematology

## 2022-07-04 ENCOUNTER — Encounter: Payer: Self-pay | Admitting: Radiology

## 2022-07-04 DIAGNOSIS — I6200 Nontraumatic subdural hemorrhage, unspecified: Secondary | ICD-10-CM | POA: Insufficient documentation

## 2022-07-04 DIAGNOSIS — Z86718 Personal history of other venous thrombosis and embolism: Secondary | ICD-10-CM | POA: Diagnosis not present

## 2022-07-04 DIAGNOSIS — I82409 Acute embolism and thrombosis of unspecified deep veins of unspecified lower extremity: Secondary | ICD-10-CM

## 2022-07-04 HISTORY — PX: IR IVC FILTER PLMT / S&I /IMG GUID/MOD SED: IMG701

## 2022-07-04 MED ORDER — SODIUM CHLORIDE 0.9 % IV SOLN: INTRAVENOUS | Status: DC

## 2022-07-04 MED ORDER — IOHEXOL 300 MG/ML  SOLN
35.0000 mL | Freq: Once | INTRAMUSCULAR | Status: AC | PRN
  Administered 2022-07-04: 35 mL

## 2022-07-04 MED ORDER — FENTANYL CITRATE (PF) 100 MCG/2ML IJ SOLN
INTRAMUSCULAR | Status: AC
  Filled 2022-07-04: qty 2

## 2022-07-04 MED ORDER — LIDOCAINE HCL 1 % IJ SOLN
INTRAMUSCULAR | Status: AC
  Administered 2022-07-04: 5 mL
  Filled 2022-07-04: qty 20

## 2022-07-04 MED ORDER — MIDAZOLAM HCL 2 MG/2ML IJ SOLN
INTRAMUSCULAR | Status: AC
  Filled 2022-07-04: qty 2

## 2022-07-04 MED ORDER — MIDAZOLAM HCL 5 MG/5ML IJ SOLN
INTRAMUSCULAR | Status: DC | PRN
  Administered 2022-07-04: 1 mg via INTRAVENOUS

## 2022-07-04 MED ORDER — FENTANYL CITRATE (PF) 100 MCG/2ML IJ SOLN
INTRAMUSCULAR | Status: DC | PRN
  Administered 2022-07-04: 50 ug via INTRAVENOUS

## 2022-07-04 NOTE — Procedures (Signed)
Interventional Radiology Procedure Note  Date of Procedure: 07/04/2022  Procedure: IVC filter placement   Findings:  1. IVC filter placement, Option Elite, via right CFV access    Complications: No immediate complications noted.   Estimated Blood Loss: minimal  Follow-up and Recommendations: 1. Bedrest 2 hours    Albin Felling, MD  Vascular & Interventional Radiology  07/04/2022 12:25 PM

## 2022-07-04 NOTE — Progress Notes (Signed)
Patient clinically stable post IVC filter placement per Dr Denna Haggard tolerated well. Denies complaints at present time. Report given to [facility] RN post procedure 330, received  Versed 1 mg along with Fentanyl 50 mcg Iv for procedure.

## 2022-07-10 ENCOUNTER — Telehealth: Payer: Self-pay

## 2022-07-10 NOTE — Telephone Encounter (Signed)
-----   Message from Peggyann Shoals sent at 07/10/2022 11:59 AM EST ----- Regarding: lt eye pain/headache Contact: [phone] burr holes on 05/11/22 Patient is calling to report like a headache in her left eye since yesterday. It's not like a stabbing pain but it has not eased off. Should she be concerned?

## 2022-07-10 NOTE — Telephone Encounter (Signed)
Left message to return call 

## 2022-08-17 ENCOUNTER — Ambulatory Visit (INDEPENDENT_AMBULATORY_CARE_PROVIDER_SITE_OTHER): Payer: BC Managed Care – PPO | Admitting: Neurosurgery

## 2022-08-17 ENCOUNTER — Encounter: Payer: Self-pay | Admitting: Neurosurgery

## 2022-08-17 VITALS — BP 118/78 | Ht 68.0 in | Wt 175.4 lb

## 2022-08-17 DIAGNOSIS — R479 Unspecified speech disturbances: Secondary | ICD-10-CM

## 2022-08-17 DIAGNOSIS — S065XAA Traumatic subdural hemorrhage with loss of consciousness status unknown, initial encounter: Secondary | ICD-10-CM

## 2022-08-17 DIAGNOSIS — Z09 Encounter for follow-up examination after completed treatment for conditions other than malignant neoplasm: Secondary | ICD-10-CM

## 2022-08-17 DIAGNOSIS — R413 Other amnesia: Secondary | ICD-10-CM

## 2022-08-17 DIAGNOSIS — S065XAD Traumatic subdural hemorrhage with loss of consciousness status unknown, subsequent encounter: Secondary | ICD-10-CM | POA: Diagnosis not present

## 2022-08-17 NOTE — Progress Notes (Signed)
   REFERRING PHYSICIAN:  Dion Body, [address] [place],  [place] [address]  DOS: 05/11/22 Left frontal burr hole for drainage of subdural hematoma  HISTORY OF PRESENT ILLNESS: Bianca Simmons is status post Left frontal burr hole for drainage of subdural hematoma.  She has had slow improvement with her speech.  She is having some trouble with her memory.  She has been working with speech therapy.    PHYSICAL EXAMINATION:  NEUROLOGICAL:  General: In no acute distress.   Awake, alert, oriented to person, place, and time.  Pupils equal round and reactive to light.  Facial tone is symmetric.  Tongue protrusion is midline.  Speech is slow but she is able to get out her thoughts.  She makes some paraphasic errors.  Strength: Side Biceps Triceps Deltoid Interossei Grip Wrist Ext. Wrist Flex.  R '5 5 5 5 5 5 5  '$ L '5 5 5 5 5 5 5   '$ Incision is C/D/I  Imaging:  CT scan reviewed-minimal residual left-sided subdural hematoma  Assessment / Plan: Bianca Simmons is doing well s/p above surgery.  She is still having residual symptoms of speech difficulty and memory issues.  At this point, I recommended referral to neurology for a more thorough evaluation of her cognitive difficulties.  I do think that physical therapy is indicated.  She will wait until she completes speech therapy before moving forward with this.  She will let me know.    I will see her back on an as-needed basis.     Meade Maw MD Dept of Neurosurgery

## 2022-08-23 ENCOUNTER — Telehealth: Payer: Self-pay

## 2022-08-23 NOTE — Telephone Encounter (Signed)
-----  Message from [place] sent at 08/23/2022  2:10 PM EST ----- Regarding: Should she continue use Wants to know if Dr. Darreld Mclean recommends discontinuing levETIRAcetam (KEPPRA) 500 MG tablet.

## 2022-08-24 NOTE — Telephone Encounter (Signed)
Notified pt of Dr Rhea Bleacher recommendations. Referral was already placed to neurology on 08/17/22, but she hasn't heard from them yet. I asked her to call us if she doesn't hear from them in another week.

## 2022-08-28 ENCOUNTER — Telehealth: Payer: Self-pay

## 2022-08-28 NOTE — Telephone Encounter (Signed)
-----  Message from Peggyann Shoals sent at 08/28/2022  1:27 PM EST ----- Regarding: work status Her workman's comp needs a work status letter faxed to [phone].

## 2022-08-29 ENCOUNTER — Encounter: Payer: Self-pay | Admitting: Neurosurgery

## 2022-08-29 NOTE — Telephone Encounter (Signed)
Letter faxed to number provided.

## 2022-09-14 ENCOUNTER — Telehealth: Payer: Self-pay

## 2022-09-14 DIAGNOSIS — S065XAA Traumatic subdural hemorrhage with loss of consciousness status unknown, initial encounter: Secondary | ICD-10-CM

## 2022-09-14 NOTE — Telephone Encounter (Signed)
-----   Message from Peggyann Shoals sent at 09/14/2022  1:21 PM EST ----- Regarding: nw PT order Contact: [phone] She needs a new referral for PT to send to her workman's comp adjuster. You placed one before but she was not ready for PT but she is now.

## 2022-09-14 NOTE — Telephone Encounter (Signed)
Patient aware that referral was placed and faxed to her w/c adjuster.

## 2022-09-14 NOTE — Telephone Encounter (Signed)
New referral placed.

## 2022-10-05 ENCOUNTER — Telehealth: Payer: Self-pay

## 2022-10-05 DIAGNOSIS — S065XAA Traumatic subdural hemorrhage with loss of consciousness status unknown, initial encounter: Secondary | ICD-10-CM

## 2022-10-05 NOTE — Telephone Encounter (Signed)
The system automatically put the "1 visit". Ok for new referral per Dr Izora Ribas. Can have 100 visits if they need a number. Referral placed.

## 2022-10-05 NOTE — Telephone Encounter (Signed)
-----   Message from [place] sent at 10/05/2022  1:03 PM EST ----- Regarding: Therapy Prescription [facility] called in, received prescription for just one visit. Didn't know if we wanted to send another with more?  CB: [phone] ([place]) Fax: [phone]

## 2022-10-06 ENCOUNTER — Other Ambulatory Visit: Payer: Self-pay

## 2022-10-09 NOTE — Telephone Encounter (Signed)
Referral printed and faxed.

## 2022-10-11 ENCOUNTER — Ambulatory Visit: Payer: BC Managed Care – PPO | Admitting: Nurse Practitioner

## 2022-10-11 ENCOUNTER — Ambulatory Visit: Payer: BC Managed Care – PPO | Admitting: Hematology

## 2022-10-16 NOTE — Progress Notes (Unsigned)
Patient Care Team: Dion Body, MD as PCP - General (Family Medicine)   CHIEF COMPLAINT: Follow up h/o recurrent DVT/PE  CURRENT THERAPY: Observation  INTERVAL HISTORY  [name] for follow up as scheduled. Last seen by Dr. Burr Medico 06/12/22. Due to bleeding risk she remained off anticoagulation.  IVC filter placed 07/04/2022, she tolerated procedure well. No signs of recurrent thrombosis. Every now and then she has discomfort in her chest, but does not last long. Not associated with exertion. Denies cough, dyspnea, leg edema, bleeding, or new concerns. She is still dealing with residual effects of hematoma, mainly with speech and memory. Continues PT. Weight loss from ozempic.   ROS  All other systems reviewed and negative   Past Medical History:  Diagnosis Date   Diabetes mellitus without complication ([place])    DVT (deep venous thrombosis) ([place]) 06/08/2012   Hyperlipidemia    Hypertension    Personal history of venous thrombosis and embolism 04/19/2016   Pulmonary embolism 10 yrs ago 06/07/2012   Pulmonary embolism, blood-clot, obstetric 2002   on birth control-pulm blood clot-treated with coumadin-no problems now   SOB (shortness of breath) 06/07/2012     Past Surgical History:  Procedure Laterality Date   BURR HOLE Left 05/11/2022   Procedure: BURR HOLES;  Surgeon: Meade Maw, MD;  Location: [facility];  Service: Neurosurgery;  Laterality: Left;   DILATATION & CURETTAGE/HYSTEROSCOPY WITH MYOSURE N/A 01/10/2019   Procedure: DILATATION & CURETTAGE/HYSTEROSCOPY WITH MYOSURE;  Surgeon: Benjaman Kindler, MD;  Location: [facility];  Service: Gynecology;  Laterality: N/A;   HYSTEROSCOPY WITH D & C N/A 01/10/2019   Procedure: DILATATION AND CURETTAGE /HYSTEROSCOPY;  Surgeon: Benjaman Kindler, MD;  Location: [facility];  Service: Gynecology;  Laterality: N/A;   IR IVC FILTER PLMT / S&I /IMG GUID/MOD SED  07/04/2022   IUD REMOVAL  OCT 2012   LAPAROSCOPIC TUBAL LIGATION   07/14/2011   Procedure: LAPAROSCOPIC TUBAL LIGATION;  Surgeon: Agnes Lawrence, MD;  Location: [facility];  Service: Gynecology;  Laterality: N/A;   NO PAST SURGERIES     TUBAL LIGATION  04/21/2011   Procedure: ESSURE TUBAL STERILIZATION;  Surgeon: Agnes Lawrence, MD;  Location: [facility];  Service: Gynecology;  Laterality: Bilateral;   attempted essure,removal of IUD, hysteroscopy,         dilatation and currettage   vaginal ablasion       Outpatient Encounter Medications as of 10/17/2022  Medication Sig   acetaminophen (TYLENOL) 500 MG tablet Take by mouth as needed.   cholecalciferol (VITAMIN D3) 25 MCG (1000 UNIT) tablet Take 1,000 Units by mouth daily.   lisinopril (ZESTRIL) 5 MG tablet Take 5 mg by mouth daily.   lovastatin (MEVACOR) 20 MG tablet Take 20 mg by mouth at bedtime.   magnesium oxide (MAG-OX) 400 (240 Mg) MG tablet Take 1 tablet by mouth daily.   metFORMIN (GLUCOPHAGE) 500 MG tablet Take 500 mg by mouth daily with breakfast.   OZEMPIC, 1 MG/DOSE, 4 MG/3ML SOPN Inject into the skin.   [DISCONTINUED] levETIRAcetam (KEPPRA) 500 MG tablet Take 1 tablet (500 mg total) by mouth 2 (two) times daily.   [DISCONTINUED] Rivaroxaban (XARELTO) 15 MG TABS tablet Take 1 tablet (15 mg total) by mouth 2 (two) times daily with a meal.   No facility-administered encounter medications on file as of 10/17/2022.     Today's Vitals   10/17/22 1106 10/17/22 1113  BP: (!) 121/56   Pulse: 79   Resp: 18  Temp: 98.4 F (36.9 C)   TempSrc: Oral   Weight: 175 lb 14.4 oz (79.8 kg)   Height: 5\' 8"  (1.727 m)   PainSc:  0-No pain   Body mass index is 26.75 kg/m.   PHYSICAL EXAM GENERAL:alert, no distress and comfortable SKIN: no rash  EYES: sclera clear LUNGS: clear with normal breathing effort HEART: regular rate & rhythm, no lower extremity edema ABDOMEN: abdomen soft, non-tender and normal bowel sounds NEURO: alert & oriented x 3 with fluent speech, no focal motor/sensory  deficits   CBC    Component Value Date/Time   WBC 5.6 06/12/2022 1237   WBC 4.4 05/11/2022 0441   RBC 4.29 06/12/2022 1237   HGB 12.6 06/12/2022 1237   HGB 11.7 03/30/2017 1615   HCT 38.8 06/12/2022 1237   HCT 36.0 03/30/2017 1615   PLT 216 06/12/2022 1237   PLT 259 03/30/2017 1615   MCV 90.4 06/12/2022 1237   MCV 88.9 03/30/2017 1615   MCH 29.4 06/12/2022 1237   MCHC 32.5 06/12/2022 1237   RDW 14.8 06/12/2022 1237   RDW 15.0 (H) 03/30/2017 1615   LYMPHSABS 1.5 06/12/2022 1237   LYMPHSABS 2.2 03/30/2017 1615   MONOABS 0.4 06/12/2022 1237   MONOABS 0.4 03/30/2017 1615   EOSABS 0.1 06/12/2022 1237   EOSABS 0.1 03/30/2017 1615   BASOSABS 0.0 06/12/2022 1237   BASOSABS 0.0 03/30/2017 1615     CMP     Component Value Date/Time   NA 138 06/12/2022 1237   NA 141 04/19/2017 1539   K 4.2 06/12/2022 1237   K 3.7 04/19/2017 1539   CL 106 06/12/2022 1237   CL 107 08/29/2012 0832   CO2 28 06/12/2022 1237   CO2 26 04/19/2017 1539   GLUCOSE 82 06/12/2022 1237   GLUCOSE 98 04/19/2017 1539   GLUCOSE 97 08/29/2012 0832   BUN 18 06/12/2022 1237   BUN 12.9 04/19/2017 1539   CREATININE 0.73 06/12/2022 1237   CREATININE 0.9 04/19/2017 1539   CALCIUM 9.1 06/12/2022 1237   CALCIUM 9.4 04/19/2017 1539   PROT 7.6 06/12/2022 1237   PROT 7.6 04/19/2017 1539   ALBUMIN 4.1 06/12/2022 1237   ALBUMIN 3.4 (L) 04/19/2017 1539   AST 14 (L) 06/12/2022 1237   AST 22 04/19/2017 1539   ALT 16 06/12/2022 1237   ALT 23 04/19/2017 1539   ALKPHOS 98 06/12/2022 1237   ALKPHOS 114 04/19/2017 1539   BILITOT 0.3 06/12/2022 1237   BILITOT 0.22 04/19/2017 1539   GFRNONAA >60 06/12/2022 1237   GFRAA >60 04/30/2020 0854     ASSESSMENT & PLAN:Bianca Simmons is a 57 y.o. female with    1. Recurrent PE and DVT -h/o PE in association with birth control pills in 2001 -started Xarelto 05/2012 for unprovoked bilateral PE and asymptomatic RLE DVT. Negative hypercoagulation workup -developed recurrent  unprovoked bilateral PE when she was on Xarelto 20 mg daily in 04/2016 and was switched to Coumadin. -recommend lifelong prophylactic anticoagulation. -She fell and developed subdural hematoma s/p burrhole procedure on 05/11/22, coumadin on hold since then -S/p IVC filter placed12/5/23 by IR -Ms. Allyn is clinically doing well. No evidence of recurrent thrombosis or bleeding. Still with residual deficits from subdural hematoma.  -Given her residual deficits and risk for recurrent fall/bleeding, I am recommending to remain off anticoagulation at this time -Will message IR re: IVC filter, whether it should be exchanged or made permanent -F/up with here in 6 months, or sooner if needed   2.  Subdural hematoma after fall  -secondary to head trauma from fall -she developed headaches (worse with bending), nausea, right hand weakness, and slurred speech. -ultimately underwent burr holes procedure 05/11/22. -Now with residual headache, altered memory, and speech issues. Working with PT -Seen by neuro 10/05/22   PLAN: -Remain off anticoagulation for now -Cc note to IR re: IVC filter -F/up in 6 months, or sooner if needed   All questions were answered. The patient knows to call the clinic with any problems, questions or concerns. No barriers to learning were detected.   Cira Rue, NP-C 10/17/2022

## 2022-10-17 ENCOUNTER — Encounter: Payer: Self-pay | Admitting: Nurse Practitioner

## 2022-10-17 ENCOUNTER — Inpatient Hospital Stay: Payer: BC Managed Care – PPO | Attending: Hematology | Admitting: Nurse Practitioner

## 2022-10-17 VITALS — BP 121/56 | HR 79 | Temp 98.4°F | Resp 18 | Ht 68.0 in | Wt 175.9 lb

## 2022-10-17 DIAGNOSIS — Z86718 Personal history of other venous thrombosis and embolism: Secondary | ICD-10-CM | POA: Diagnosis present

## 2022-10-17 DIAGNOSIS — Z86711 Personal history of pulmonary embolism: Secondary | ICD-10-CM | POA: Diagnosis not present

## 2022-10-17 DIAGNOSIS — I82409 Acute embolism and thrombosis of unspecified deep veins of unspecified lower extremity: Secondary | ICD-10-CM | POA: Diagnosis not present

## 2023-04-18 ENCOUNTER — Other Ambulatory Visit: Payer: Self-pay | Admitting: Family Medicine

## 2023-04-18 DIAGNOSIS — Z1231 Encounter for screening mammogram for malignant neoplasm of breast: Secondary | ICD-10-CM

## 2023-04-19 ENCOUNTER — Inpatient Hospital Stay: Payer: BC Managed Care – PPO | Admitting: Hematology

## 2023-04-19 ENCOUNTER — Other Ambulatory Visit: Payer: Self-pay | Admitting: *Deleted

## 2023-04-19 ENCOUNTER — Inpatient Hospital Stay: Payer: BC Managed Care – PPO | Attending: Hematology

## 2023-04-19 ENCOUNTER — Inpatient Hospital Stay
Admission: RE | Admit: 2023-04-19 | Discharge: 2023-04-19 | Disposition: A | Discharge status: Home / Self Care | Payer: Self-pay | Source: Ambulatory Visit | Attending: Family Medicine | Admitting: Family Medicine

## 2023-04-19 VITALS — BP 125/51 | HR 76 | Temp 98.0°F | Resp 17 | Ht 68.0 in | Wt 157.7 lb

## 2023-04-19 DIAGNOSIS — R413 Other amnesia: Secondary | ICD-10-CM | POA: Insufficient documentation

## 2023-04-19 DIAGNOSIS — E785 Hyperlipidemia, unspecified: Secondary | ICD-10-CM | POA: Insufficient documentation

## 2023-04-19 DIAGNOSIS — I10 Essential (primary) hypertension: Secondary | ICD-10-CM | POA: Insufficient documentation

## 2023-04-19 DIAGNOSIS — Z1231 Encounter for screening mammogram for malignant neoplasm of breast: Secondary | ICD-10-CM

## 2023-04-19 DIAGNOSIS — Z86711 Personal history of pulmonary embolism: Secondary | ICD-10-CM | POA: Diagnosis present

## 2023-04-19 DIAGNOSIS — Z8679 Personal history of other diseases of the circulatory system: Secondary | ICD-10-CM | POA: Diagnosis not present

## 2023-04-19 DIAGNOSIS — I2782 Chronic pulmonary embolism: Secondary | ICD-10-CM

## 2023-04-19 DIAGNOSIS — R519 Headache, unspecified: Secondary | ICD-10-CM | POA: Insufficient documentation

## 2023-04-19 DIAGNOSIS — E119 Type 2 diabetes mellitus without complications: Secondary | ICD-10-CM | POA: Insufficient documentation

## 2023-04-19 DIAGNOSIS — Z86718 Personal history of other venous thrombosis and embolism: Secondary | ICD-10-CM | POA: Diagnosis not present

## 2023-04-19 DIAGNOSIS — I82409 Acute embolism and thrombosis of unspecified deep veins of unspecified lower extremity: Secondary | ICD-10-CM

## 2023-04-19 DIAGNOSIS — R079 Chest pain, unspecified: Secondary | ICD-10-CM | POA: Insufficient documentation

## 2023-04-19 LAB — PROTIME-INR
INR: 1.1 (ref 0.8–1.2)
Prothrombin Time: 14.5 seconds (ref 11.4–15.2)

## 2023-04-19 LAB — CBC WITH DIFFERENTIAL (CANCER CENTER ONLY)
Abs Immature Granulocytes: 0.01 10*3/uL (ref 0.00–0.07)
Basophils Absolute: 0 10*3/uL (ref 0.0–0.1)
Basophils Relative: 0 %
Eosinophils Absolute: 0.1 10*3/uL (ref 0.0–0.5)
Eosinophils Relative: 2 %
HCT: 38.9 % (ref 36.0–46.0)
Hemoglobin: 12.5 g/dL (ref 12.0–15.0)
Immature Granulocytes: 0 %
Lymphocytes Relative: 30 %
Lymphs Abs: 1.4 10*3/uL (ref 0.7–4.0)
MCH: 29.6 pg (ref 26.0–34.0)
MCHC: 32.1 g/dL (ref 30.0–36.0)
MCV: 92 fL (ref 80.0–100.0)
Monocytes Absolute: 0.4 10*3/uL (ref 0.1–1.0)
Monocytes Relative: 9 %
Neutro Abs: 2.7 10*3/uL (ref 1.7–7.7)
Neutrophils Relative %: 59 %
Platelet Count: ADEQUATE 10*3/uL (ref 150–400)
RBC: 4.23 MIL/uL (ref 3.87–5.11)
RDW: 12.4 % (ref 11.5–15.5)
WBC Count: 4.6 10*3/uL (ref 4.0–10.5)
nRBC: 0 % (ref 0.0–0.2)

## 2023-04-19 LAB — CMP (CANCER CENTER ONLY)
ALT: 9 U/L (ref 0–44)
AST: 14 U/L — ABNORMAL LOW (ref 15–41)
Albumin: 3.9 g/dL (ref 3.5–5.0)
Alkaline Phosphatase: 143 U/L — ABNORMAL HIGH (ref 38–126)
Anion gap: 4 — ABNORMAL LOW (ref 5–15)
BUN: 20 mg/dL (ref 6–20)
CO2: 29 mmol/L (ref 22–32)
Calcium: 9.4 mg/dL (ref 8.9–10.3)
Chloride: 106 mmol/L (ref 98–111)
Creatinine: 0.66 mg/dL (ref 0.44–1.00)
GFR, Estimated: >60 mL/min (ref >60)
Glucose, Bld: 82 mg/dL (ref 70–99)
Potassium: 4 mmol/L (ref 3.5–5.1)
Sodium: 139 mmol/L (ref 135–145)
Total Bilirubin: 0.5 mg/dL (ref 0.3–1.2)
Total Protein: 7.3 g/dL (ref 6.5–8.1)

## 2023-04-19 NOTE — Progress Notes (Signed)
[facility]   [phone] [phone]   Clinic Follow up Note   Patient Care Team: Marisue Ivan, MD as PCP - General (Family Medicine)  Date of Service:  04/19/2023  CHIEF COMPLAINT: f/u of history of thrombosis   ASSESSMENT:  Bianca Simmons is a 57 y.o. female with   Personal history of venous thrombosis and embolism -h/o PE in association with birth control pills in 2001 -started Xarelto 05/2012 for unprovoked bilateral PE and asymptomatic RLE DVT. Negative hypercoagulation workup -developed recurrent unprovoked bilateral PE when she was on Xarelto 20 mg daily in 04/2016 and was switched to Coumadin. -recommend lifelong prophylactic anticoagulation. -She unfortunately had subdural hematoma after fall in 2023, Coumadin was stopped, and she had IVC filter placed.  Intracranial Hemorrhage History of fall with subsequent intracranial bleeding. Currently experiencing memory issues and occasional headaches. No new falls or balance issues. -Continue monitoring symptoms.  Venous Thromboembolism History of DVT and PE. Currently off Coumadin due to intracranial bleeding and has an IVC filter in place. No current signs of blood clots. Intermittent chest pain, but no shortness of breath. -Continue to hold Coumadin due to risk of bleeding. -Contact interventional radiologist regarding IVC filter management. -If new blood clot develops, consider restarting anticoagulation. -Advise patient to stay active, move around every few hours, especially during travel or post-surgery.  General Health Maintenance -Continue management of diabetes and hypertension with primary care physician. -Ensure mammogram is completed as scheduled. -Check status of colonoscopy and ensure it is not overdue. -Follow-up as needed, with immediate contact if symptoms of blood clot develop. -Consider purchasing a meter to monitor oxygen levels at home.    INTERVAL HISTORY:   Discussed the use of AI scribe software for clinical note transcription with the patient, who gave verbal consent to proceed.  The patient, with a history of intracranial bleeding secondary to a fall, presents with ongoing memory loss and occasional headaches. They deny any new issues in the past three to four months. They report no problems with walking or balance and deny any recent falls. They have an IVC filter in place due to a history of blood clots in the legs and lungs, which was inserted after discontinuing Coumadin due to the intracranial bleed. They report no recent contact from interventional radiology regarding the filter. They deny any signs of blood clots such as leg swelling or pain, but do report intermittent chest pain. They deny any associated shortness of breath. They are currently not on any medications from the hematologist.         All other systems were reviewed with the patient and are negative.  MEDICAL HISTORY:  Past Medical History:  Diagnosis Date   Diabetes mellitus without complication ([facility])    DVT (deep venous thrombosis) ([facility]) 06/08/2012   Hyperlipidemia    Hypertension    Personal history of venous thrombosis and embolism 04/19/2016   Pulmonary embolism 10 yrs ago 06/07/2012   Pulmonary embolism, blood-clot, obstetric 2002   on birth control-pulm blood clot-treated with coumadin-no problems now   SOB (shortness of breath) 06/07/2012    SURGICAL HISTORY: Past Surgical History:  Procedure Laterality Date   BURR HOLE Left 05/11/2022   Procedure: BURR HOLES;  Surgeon: Venetia Night, MD;  Location: [facility];  Service: Neurosurgery;  Laterality: Left;   DILATATION & CURETTAGE/HYSTEROSCOPY WITH MYOSURE N/A 01/10/2019   Procedure: DILATATION & CURETTAGE/HYSTEROSCOPY WITH MYOSURE;  Surgeon: Christeen Douglas, MD;  Location: [facility];  Service: Gynecology;  Laterality: N/A;  HYSTEROSCOPY WITH D & C N/A 01/10/2019   Procedure: DILATATION AND CURETTAGE  /HYSTEROSCOPY;  Surgeon: Christeen Douglas, MD;  Location: [facility];  Service: Gynecology;  Laterality: N/A;   IR IVC FILTER PLMT / S&I /IMG GUID/MOD SED  07/04/2022   IUD REMOVAL  OCT 2012   LAPAROSCOPIC TUBAL LIGATION  07/14/2011   Procedure: LAPAROSCOPIC TUBAL LIGATION;  Surgeon: Roseanna Rainbow, MD;  Location: [facility];  Service: Gynecology;  Laterality: N/A;   NO PAST SURGERIES     TUBAL LIGATION  04/21/2011   Procedure: ESSURE TUBAL STERILIZATION;  Surgeon: Roseanna Rainbow, MD;  Location: [facility];  Service: Gynecology;  Laterality: Bilateral;   attempted essure,removal of IUD, hysteroscopy,         dilatation and currettage   vaginal ablasion      I have reviewed the social history and family history with the patient and they are unchanged from previous note.  ALLERGIES:  is allergic to sulfa antibiotics.  MEDICATIONS:  Current Outpatient Medications  Medication Sig Dispense Refill   acetaminophen (TYLENOL) 500 MG tablet Take by mouth as needed.     cholecalciferol (VITAMIN D3) 25 MCG (1000 UNIT) tablet Take 1,000 Units by mouth daily.     lisinopril (ZESTRIL) 5 MG tablet Take 5 mg by mouth daily.     lovastatin (MEVACOR) 20 MG tablet Take 20 mg by mouth at bedtime.     magnesium oxide (MAG-OX) 400 (240 Mg) MG tablet Take 1 tablet by mouth daily.     metFORMIN (GLUCOPHAGE) 500 MG tablet Take 500 mg by mouth daily with breakfast.     OZEMPIC, 1 MG/DOSE, 4 MG/3ML SOPN Inject into the skin.     No current facility-administered medications for this visit.    PHYSICAL EXAMINATION: ECOG PERFORMANCE STATUS: 1 - Symptomatic but completely ambulatory  Vitals:   04/19/23 1103  BP: (!) 125/51  Pulse: 76  Resp: 17  Temp: 98 F (36.7 C)  SpO2: 100%   Wt Readings from Last 3 Encounters:  04/19/23 157 lb 11.2 oz (71.5 kg)  10/17/22 175 lb 14.4 oz (79.8 kg)  08/17/22 175 lb 6.4 oz (79.6 kg)     GENERAL:alert, no distress and comfortable SKIN: skin color, texture, turgor are  normal, no rashes or significant lesions EYES: normal, Conjunctiva are pink and non-injected, sclera clear Musculoskeletal:no cyanosis of digits and no clubbing  NEURO: alert & oriented x 3 with fluent speech, no focal motor/sensory deficits         LABORATORY DATA:  I have reviewed the data as listed    Latest Ref Rng & Units 04/19/2023   10:44 AM 06/12/2022   12:37 PM 05/11/2022    4:41 AM  CBC  WBC 4.0 - 10.5 K/uL 4.6  5.6  4.4   Hemoglobin 12.0 - 15.0 g/dL 16.1  09.6  04.5   Hematocrit 36.0 - 46.0 % 38.9  38.8  31.7   Platelets 150 - 400 K/uL PLATELET CLUMPS NOTED ON SMEAR, COUNT APPEARS ADEQUATE  216  210         Latest Ref Rng & Units 04/19/2023   10:44 AM 06/12/2022   12:37 PM 05/11/2022    4:41 AM  CMP  Glucose 70 - 99 mg/dL 82  82  97   BUN 6 - 20 mg/dL 20  18  9    Creatinine 0.44 - 1.00 mg/dL 4.09  8.11  9.14   Sodium 135 - 145 mmol/L 139  138  142  Potassium 3.5 - 5.1 mmol/L 4.0  4.2  3.7   Chloride 98 - 111 mmol/L 106  106  109   CO2 22 - 32 mmol/L 29  28  27    Calcium 8.9 - 10.3 mg/dL 9.4  9.1  8.7   Total Protein 6.5 - 8.1 g/dL 7.3  7.6  6.1   Total Bilirubin 0.3 - 1.2 mg/dL 0.5  0.3  0.7   Alkaline Phos 38 - 126 U/L 143  98  70   AST 15 - 41 U/L 14  14  31    ALT 0 - 44 U/L 9  16  33       RADIOGRAPHIC STUDIES: I have personally reviewed the radiological images as listed and agreed with the findings in the report. No results found.    No orders of the defined types were placed in this encounter.  All questions were answered. The patient knows to call the clinic with any problems, questions or concerns. No barriers to learning was detected. The total time spent in the appointment was 20 minutes.     Malachy Mood, MD 04/19/2023

## 2023-04-19 NOTE — Assessment & Plan Note (Signed)
-  h/o PE in association with birth control pills in 2001 -started Xarelto 05/2012 for unprovoked bilateral PE and asymptomatic RLE DVT. Negative hypercoagulation workup -developed recurrent unprovoked bilateral PE when she was on Xarelto 20 mg daily in 04/2016 and was switched to Coumadin. -recommend lifelong prophylactic anticoagulation. -She unfortunately had subdural hematoma after fall in 2023, Coumadin was stopped, and she had IVC filter placed.

## 2023-04-23 ENCOUNTER — Other Ambulatory Visit: Payer: Self-pay

## 2023-04-23 ENCOUNTER — Telehealth: Payer: Self-pay

## 2023-04-23 NOTE — Telephone Encounter (Signed)
I  Let the pt know  that her  Filter can stay in indefinitely if needed, or if/when need for [facility] diminishes. We do not need to exchange IVC filters. Per Dr .Elijio Miles El-Abd. Pt  verbalized understanding.   Kionte Baumgardner M,CMA

## 2023-04-23 NOTE — Telephone Encounter (Deleted)
-----   Message from Malachy Mood sent at 04/23/2023 12:23 PM EDT ----- Chell,  Please call pt and let her know the message below, thx ----- Message ----- From: Pernell Dupre, MD Sent: 04/20/2023   4:22 PM EDT To: Verlee Rossetti, CMA; Malachy Mood, MD  Filter can stay in indefinitely if needed, or if/when need for [facility] diminishes. We do not need to exchange IVC filters.   Elijio Miles ----- Message ----- From: Malachy Mood, MD Sent: 04/19/2023  11:46 AM EDT To: Pernell Dupre, MD; Verlee Rossetti, CMA  Dr. Juliette Alcide,  You placed her IVC filter in 05/2022. I do not plan to put her back on anticoagulation, can she keep her filter forever, or you need to exchange her filter?  John, please let pt know Dr. Wynonia Musty response.   Terrace Arabia

## 2023-05-02 ENCOUNTER — Ambulatory Visit
Admission: RE | Admit: 2023-05-02 | Discharge: 2023-05-02 | Disposition: A | Discharge status: Home / Self Care | Payer: BC Managed Care – PPO | Source: Ambulatory Visit | Attending: Family Medicine | Admitting: Family Medicine

## 2023-05-02 DIAGNOSIS — Z1231 Encounter for screening mammogram for malignant neoplasm of breast: Secondary | ICD-10-CM | POA: Diagnosis present

## 2024-06-16 ENCOUNTER — Other Ambulatory Visit: Payer: Self-pay | Admitting: Family Medicine

## 2024-06-16 DIAGNOSIS — Z1231 Encounter for screening mammogram for malignant neoplasm of breast: Secondary | ICD-10-CM

## 2024-07-22 ENCOUNTER — Ambulatory Visit
Admission: RE | Admit: 2024-07-22 | Discharge: 2024-07-22 | Disposition: A | Discharge status: Home / Self Care | Payer: Self-pay | Source: Ambulatory Visit | Attending: Family Medicine | Admitting: Family Medicine

## 2024-07-22 DIAGNOSIS — Z1231 Encounter for screening mammogram for malignant neoplasm of breast: Secondary | ICD-10-CM | POA: Diagnosis present
